# Patient Record
Sex: Male | Born: 1955 | Race: White | Hispanic: No | Smoking: Former smoker
Health system: Southern US, Community
[De-identification: ages and names within clinical notes are randomized; demographics above are authoritative.]

## PROBLEM LIST (undated history)

## (undated) DIAGNOSIS — N200 Calculus of kidney: Secondary | ICD-10-CM

## (undated) DIAGNOSIS — I10 Essential (primary) hypertension: Secondary | ICD-10-CM

## (undated) DIAGNOSIS — Z86010 Personal history of colonic polyps: Secondary | ICD-10-CM

## (undated) DIAGNOSIS — R1032 Left lower quadrant pain: Secondary | ICD-10-CM

## (undated) DIAGNOSIS — K219 Gastro-esophageal reflux disease without esophagitis: Secondary | ICD-10-CM

## (undated) DIAGNOSIS — F32A Depression, unspecified: Secondary | ICD-10-CM

## (undated) DIAGNOSIS — C4490 Unspecified malignant neoplasm of skin, unspecified: Secondary | ICD-10-CM

## (undated) DIAGNOSIS — F419 Anxiety disorder, unspecified: Secondary | ICD-10-CM

## (undated) DIAGNOSIS — J309 Allergic rhinitis, unspecified: Secondary | ICD-10-CM

## (undated) DIAGNOSIS — N400 Enlarged prostate without lower urinary tract symptoms: Secondary | ICD-10-CM

## (undated) DIAGNOSIS — R7303 Prediabetes: Secondary | ICD-10-CM

## (undated) DIAGNOSIS — M160 Bilateral primary osteoarthritis of hip: Secondary | ICD-10-CM

## (undated) DIAGNOSIS — H6990 Unspecified Eustachian tube disorder, unspecified ear: Secondary | ICD-10-CM

## (undated) DIAGNOSIS — K760 Fatty (change of) liver, not elsewhere classified: Secondary | ICD-10-CM

## (undated) DIAGNOSIS — H6980 Other specified disorders of Eustachian tube, unspecified ear: Secondary | ICD-10-CM

## (undated) DIAGNOSIS — E785 Hyperlipidemia, unspecified: Secondary | ICD-10-CM

## (undated) DIAGNOSIS — Z860101 Personal history of adenomatous and serrated colon polyps: Secondary | ICD-10-CM

## (undated) DIAGNOSIS — M25561 Pain in right knee: Secondary | ICD-10-CM

## (undated) DIAGNOSIS — E66812 Obesity, class 2: Secondary | ICD-10-CM

## (undated) DIAGNOSIS — K589 Irritable bowel syndrome without diarrhea: Secondary | ICD-10-CM

## (undated) DIAGNOSIS — Z8701 Personal history of pneumonia (recurrent): Secondary | ICD-10-CM

## (undated) DIAGNOSIS — Z87442 Personal history of urinary calculi: Secondary | ICD-10-CM

## (undated) DIAGNOSIS — G4730 Sleep apnea, unspecified: Secondary | ICD-10-CM

## (undated) DIAGNOSIS — K5732 Diverticulitis of large intestine without perforation or abscess without bleeding: Secondary | ICD-10-CM

## (undated) DIAGNOSIS — G4733 Obstructive sleep apnea (adult) (pediatric): Secondary | ICD-10-CM

## (undated) DIAGNOSIS — E669 Obesity, unspecified: Secondary | ICD-10-CM

## (undated) DIAGNOSIS — F329 Major depressive disorder, single episode, unspecified: Secondary | ICD-10-CM

## (undated) HISTORY — DX: Irritable bowel syndrome, unspecified: K58.9

## (undated) HISTORY — PX: COLON SURGERY: SHX602

## (undated) HISTORY — DX: Fatty (change of) liver, not elsewhere classified: K76.0

## (undated) HISTORY — DX: Bilateral primary osteoarthritis of hip: M16.0

## (undated) HISTORY — DX: Major depressive disorder, single episode, unspecified: F32.9

## (undated) HISTORY — DX: Obesity, class 2: E66.812

## (undated) HISTORY — DX: Obstructive sleep apnea (adult) (pediatric): G47.33

## (undated) HISTORY — DX: Essential (primary) hypertension: I10

## (undated) HISTORY — DX: Sleep apnea, unspecified: G47.30

## (undated) HISTORY — DX: Anxiety disorder, unspecified: F41.9

## (undated) HISTORY — DX: Diverticulitis of large intestine without perforation or abscess without bleeding: K57.32

## (undated) HISTORY — DX: Hyperlipidemia, unspecified: E78.5

## (undated) HISTORY — PX: ESOPHAGOGASTRODUODENOSCOPY: SHX1529

## (undated) HISTORY — DX: Calculus of kidney: N20.0

## (undated) HISTORY — DX: Obesity, unspecified: E66.9

## (undated) HISTORY — DX: Allergic rhinitis, unspecified: J30.9

## (undated) HISTORY — DX: Pain in right knee: M25.561

## (undated) HISTORY — DX: Gastro-esophageal reflux disease without esophagitis: K21.9

## (undated) HISTORY — DX: Personal history of adenomatous and serrated colon polyps: Z86.0101

## (undated) HISTORY — PX: COLONOSCOPY: SHX174

## (undated) HISTORY — DX: Left lower quadrant pain: R10.32

## (undated) HISTORY — DX: Depression, unspecified: F32.A

## (undated) HISTORY — PX: JOINT REPLACEMENT: SHX530

## (undated) HISTORY — DX: Unspecified eustachian tube disorder, unspecified ear: H69.90

## (undated) HISTORY — DX: Prediabetes: R73.03

## (undated) HISTORY — PX: HERNIA REPAIR: SHX51

## (undated) HISTORY — DX: Personal history of colonic polyps: Z86.010

## (undated) HISTORY — DX: Other specified disorders of Eustachian tube, unspecified ear: H69.80

## (undated) HISTORY — DX: Benign prostatic hyperplasia without lower urinary tract symptoms: N40.0

## (undated) HISTORY — PX: TONSILLECTOMY: SHX5217

---

## 2003-05-06 ENCOUNTER — Ambulatory Visit (HOSPITAL_BASED_OUTPATIENT_CLINIC_OR_DEPARTMENT_OTHER): Admission: RE | Admit: 2003-05-06 | Discharge: 2003-05-06 | Payer: Self-pay | Admitting: General Surgery

## 2004-09-14 ENCOUNTER — Ambulatory Visit: Payer: Self-pay | Admitting: Family Medicine

## 2004-11-23 ENCOUNTER — Ambulatory Visit: Payer: Self-pay | Admitting: Family Medicine

## 2004-11-30 ENCOUNTER — Ambulatory Visit: Payer: Self-pay | Admitting: Family Medicine

## 2005-02-23 ENCOUNTER — Ambulatory Visit: Payer: Self-pay | Admitting: Family Medicine

## 2005-03-15 ENCOUNTER — Ambulatory Visit: Payer: Self-pay | Admitting: Internal Medicine

## 2005-03-26 ENCOUNTER — Ambulatory Visit: Payer: Self-pay | Admitting: Family Medicine

## 2005-08-05 ENCOUNTER — Ambulatory Visit: Payer: Self-pay | Admitting: Family Medicine

## 2005-08-25 ENCOUNTER — Ambulatory Visit: Payer: Self-pay | Admitting: Family Medicine

## 2005-11-08 ENCOUNTER — Ambulatory Visit: Payer: Self-pay | Admitting: Family Medicine

## 2005-11-15 ENCOUNTER — Ambulatory Visit: Payer: Self-pay | Admitting: Family Medicine

## 2006-02-01 ENCOUNTER — Ambulatory Visit: Payer: Self-pay | Admitting: Family Medicine

## 2006-02-09 ENCOUNTER — Ambulatory Visit: Payer: Self-pay | Admitting: Family Medicine

## 2006-07-25 ENCOUNTER — Ambulatory Visit: Payer: Self-pay | Admitting: Family Medicine

## 2006-08-01 ENCOUNTER — Ambulatory Visit: Payer: Self-pay | Admitting: Family Medicine

## 2006-11-18 ENCOUNTER — Ambulatory Visit: Payer: Self-pay | Admitting: Family Medicine

## 2006-11-18 LAB — CONVERTED CEMR LAB
ALT: 26 U/L (ref 0–40)
AST: 20 U/L (ref 0–37)
Albumin: 3.9 g/dL (ref 3.5–5.2)
Alkaline Phosphatase: 54 U/L (ref 39–117)
BUN: 16 mg/dL (ref 6–23)
Basophils Absolute: 0 K/uL (ref 0.0–0.1)
Basophils Relative: 0.3 % (ref 0.0–1.0)
Bilirubin, Direct: 0.1 mg/dL (ref 0.0–0.3)
CO2: 31 meq/L (ref 19–32)
Calcium: 9.3 mg/dL (ref 8.4–10.5)
Chloride: 105 meq/L (ref 96–112)
Cholesterol: 237 mg/dL (ref 0–200)
Creatinine, Ser: 1 mg/dL (ref 0.4–1.5)
Direct LDL: 171 mg/dL
Eosinophils Absolute: 0.2 K/uL (ref 0.0–0.6)
Eosinophils Relative: 4.2 % (ref 0.0–5.0)
GFR calc Af Amer: 102 mL/min
GFR calc non Af Amer: 84 mL/min
Glucose, Bld: 105 mg/dL — ABNORMAL HIGH (ref 70–99)
HCT: 44.6 % (ref 39.0–52.0)
HDL: 53.8 mg/dL (ref >39.0)
Hemoglobin: 15.4 g/dL (ref 13.0–17.0)
Hgb A1c MFr Bld: 6 % (ref 4.6–6.0)
Lymphocytes Relative: 29 % (ref 12.0–46.0)
MCHC: 34.4 g/dL (ref 30.0–36.0)
MCV: 89.9 fL (ref 78.0–100.0)
Monocytes Absolute: 0.6 K/uL (ref 0.2–0.7)
Monocytes Relative: 11.7 % — ABNORMAL HIGH (ref 3.0–11.0)
Neutro Abs: 3.1 K/uL (ref 1.4–7.7)
Neutrophils Relative %: 54.8 % (ref 43.0–77.0)
PSA: 0.66 ng/mL (ref 0.10–4.00)
Platelets: 203 K/uL (ref 150–400)
Potassium: 4.6 meq/L (ref 3.5–5.1)
RBC: 4.96 M/uL (ref 4.22–5.81)
RDW: 12.5 % (ref 11.5–14.6)
Sodium: 143 meq/L (ref 135–145)
TSH: 0.97 u[IU]/mL (ref 0.35–5.50)
Total Bilirubin: 0.8 mg/dL (ref 0.3–1.2)
Total CHOL/HDL Ratio: 4.4
Total Protein: 6.7 g/dL (ref 6.0–8.3)
Triglycerides: 59 mg/dL (ref 0–149)
VLDL: 12 mg/dL (ref 0–40)
WBC: 5.5 10*3/microliter (ref 4.5–10.5)

## 2006-11-25 ENCOUNTER — Ambulatory Visit: Payer: Self-pay | Admitting: Family Medicine

## 2007-01-09 ENCOUNTER — Encounter: Admission: RE | Admit: 2007-01-09 | Discharge: 2007-01-09 | Payer: Self-pay | Admitting: Orthopaedic Surgery

## 2007-01-24 ENCOUNTER — Ambulatory Visit: Payer: Self-pay | Admitting: Family Medicine

## 2007-01-24 LAB — CONVERTED CEMR LAB
ALT: 26 units/L (ref 0–40)
AST: 23 units/L (ref 0–37)
Bilirubin, Direct: 0.1 mg/dL (ref 0.0–0.3)
Cholesterol: 178 mg/dL (ref 0–200)
HDL: 58.7 mg/dL (ref >39.0)
Total Protein: 6.5 g/dL (ref 6.0–8.3)
Triglycerides: 52 mg/dL (ref 0–149)
VLDL: 10 mg/dL (ref 0–40)

## 2007-03-09 ENCOUNTER — Encounter: Payer: Self-pay | Admitting: Family Medicine

## 2007-03-10 ENCOUNTER — Encounter: Admission: RE | Admit: 2007-03-10 | Discharge: 2007-03-10 | Payer: Self-pay | Admitting: Gastroenterology

## 2007-03-10 ENCOUNTER — Encounter: Payer: Self-pay | Admitting: Family Medicine

## 2007-04-06 ENCOUNTER — Encounter: Admission: RE | Admit: 2007-04-06 | Discharge: 2007-04-06 | Payer: Self-pay | Admitting: Gastroenterology

## 2007-09-19 DIAGNOSIS — R109 Unspecified abdominal pain: Secondary | ICD-10-CM

## 2007-10-03 ENCOUNTER — Ambulatory Visit: Payer: Self-pay | Admitting: Family Medicine

## 2007-11-20 ENCOUNTER — Ambulatory Visit: Payer: Self-pay | Admitting: Family Medicine

## 2007-11-20 LAB — CONVERTED CEMR LAB
ALT: 28 units/L (ref 0–53)
AST: 20 units/L (ref 0–37)
Alkaline Phosphatase: 51 units/L (ref 39–117)
BUN: 15 mg/dL (ref 6–23)
Basophils Relative: 0.1 % (ref 0.0–1.0)
Bilirubin, Direct: 0.1 mg/dL (ref 0.0–0.3)
CO2: 29 meq/L (ref 19–32)
Calcium: 9.3 mg/dL (ref 8.4–10.5)
Creatinine, Ser: 1.1 mg/dL (ref 0.4–1.5)
Eosinophils Relative: 3.7 % (ref 0.0–5.0)
Glucose, Bld: 105 mg/dL — ABNORMAL HIGH (ref 70–99)
HDL: 44.9 mg/dL (ref >39.0)
Hemoglobin: 14.9 g/dL (ref 13.0–17.0)
Ketones, urine, test strip: NEGATIVE
LDL Cholesterol: 96 mg/dL (ref 0–99)
Lymphocytes Relative: 32.9 % (ref 12.0–46.0)
Monocytes Relative: 10.3 % (ref 3.0–11.0)
Nitrite: NEGATIVE
Platelets: 183 10*3/uL (ref 150–400)
RDW: 12.7 % (ref 11.5–14.6)
Specific Gravity, Urine: 1.01
Total Bilirubin: 0.9 mg/dL (ref 0.3–1.2)
Total Protein: 6.2 g/dL (ref 6.0–8.3)
Urobilinogen, UA: 0.2
VLDL: 9 mg/dL (ref 0–40)
WBC Urine, dipstick: NEGATIVE
WBC: 5.8 10*3/uL (ref 4.5–10.5)

## 2007-11-27 ENCOUNTER — Ambulatory Visit: Payer: Self-pay | Admitting: Family Medicine

## 2007-11-27 DIAGNOSIS — K589 Irritable bowel syndrome without diarrhea: Secondary | ICD-10-CM

## 2007-11-27 DIAGNOSIS — E785 Hyperlipidemia, unspecified: Secondary | ICD-10-CM

## 2007-11-29 IMAGING — CR DG UGI W/ SMALL BOWEL HIGH DENSITY
19 of 24 series · 19 of 24 positions shown · non-contrast
Comparison: none

CLINICAL DATA: Minimal thickening of the mucosa of the terminal ileum on CT.  Diarrhea.
 UPPER GI SERIES WITH SMALL BOWEL FOLLOW THROUGH: 
 KUB:  A preliminary film of the abdomen shows a nonspecific bowel gas pattern.  No opaque calculi are noted.  Small amount of residual contrast is noted in the diverticulum overlying the right colon.  
 UPPER GI:  Double contrast upper GI was performed.  The mucosa of the esophagus appears normal.  There is a small sliding hiatal hernia present and moderate gastroesophageal reflux is noted.  The stomach is normal in contour and peristalsis. The duodenal bulb fills well with no ulceration and the duodenal loop is in normal position.  
 SMALL BOWEL FOLLOW THROUGH:  The patient was given additional barium orally and images of the small bowel were obtained. The mucosa of the small bowel appears normal with no edema, mass, or dilatation. The terminal ileum is well visualized and appears normal.

[run (1 of 19)]
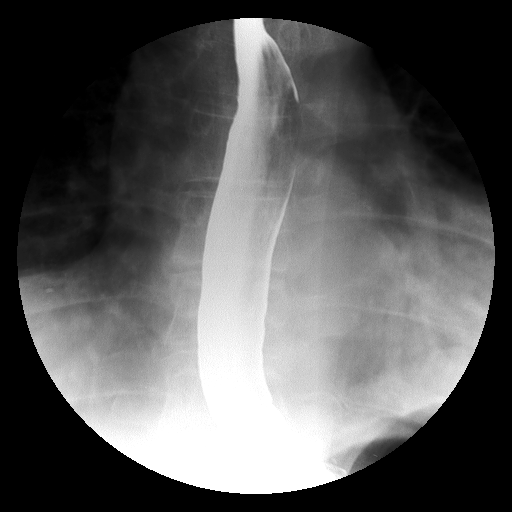

[run (2 of 19)]
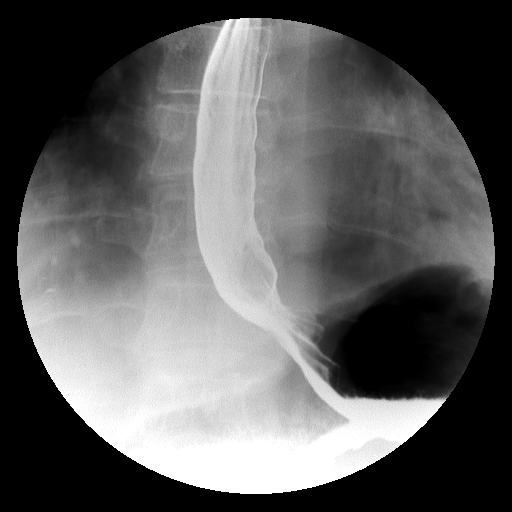

[run (3 of 19)]
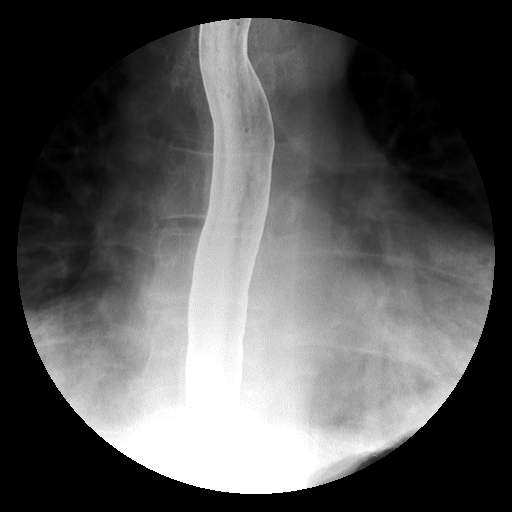

[run (4 of 19)]
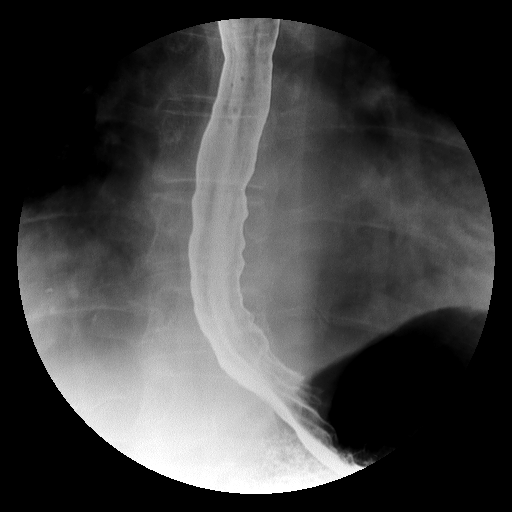

[run (5 of 19)]
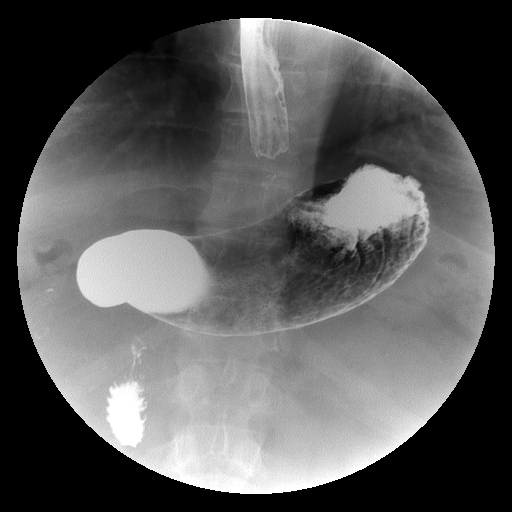

[run (6 of 19)]
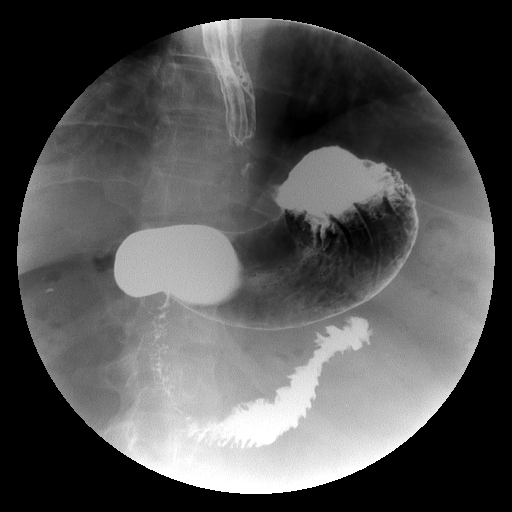

[run (7 of 19)]
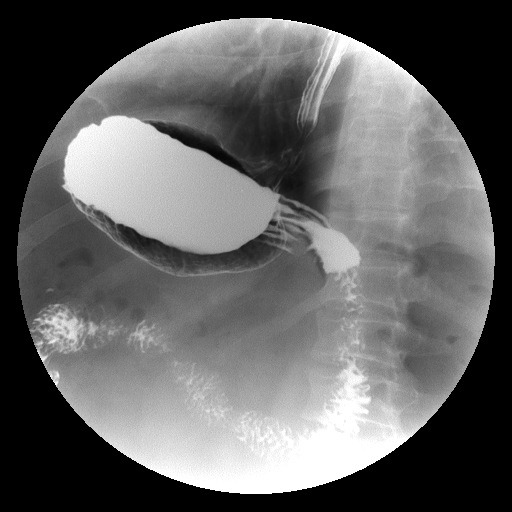

[run (8 of 19)]
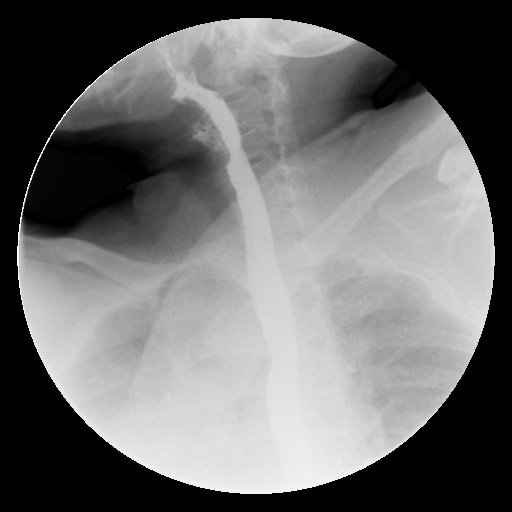

[run (9 of 19)]
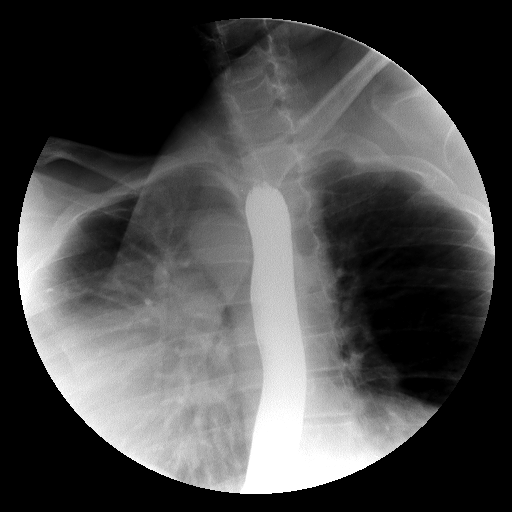

[run (10 of 19)]
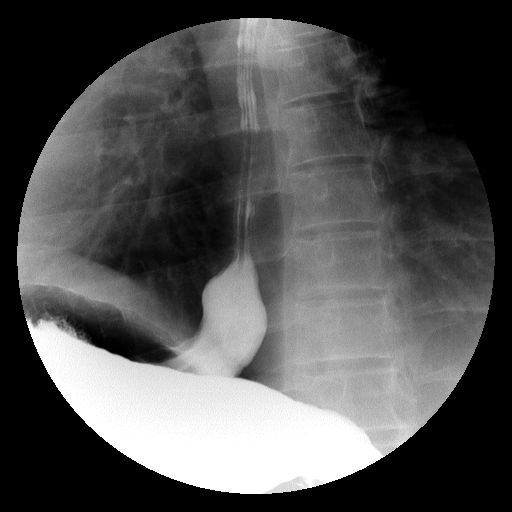

[run (11 of 19)]
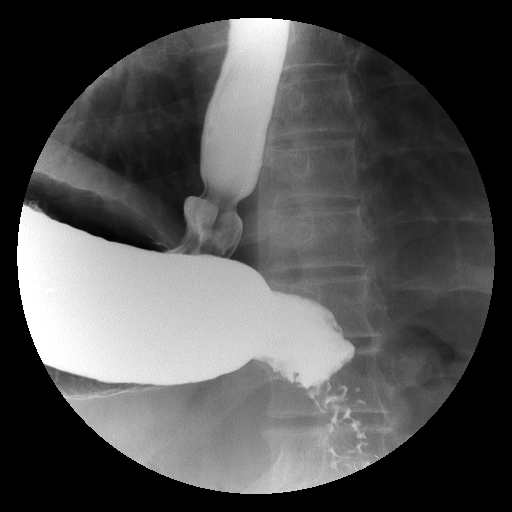

[run (12 of 19)]
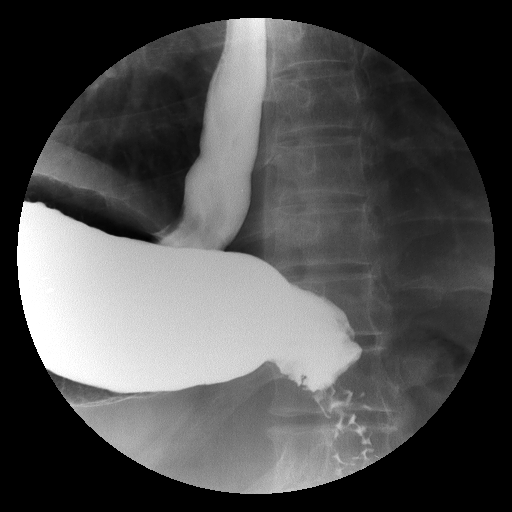

[run (13 of 19)]
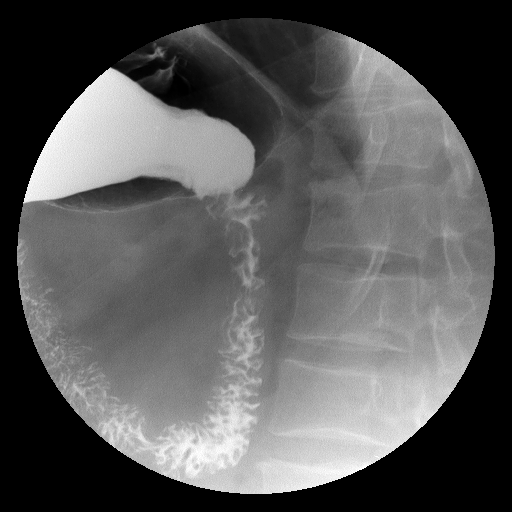

[run (14 of 19)]
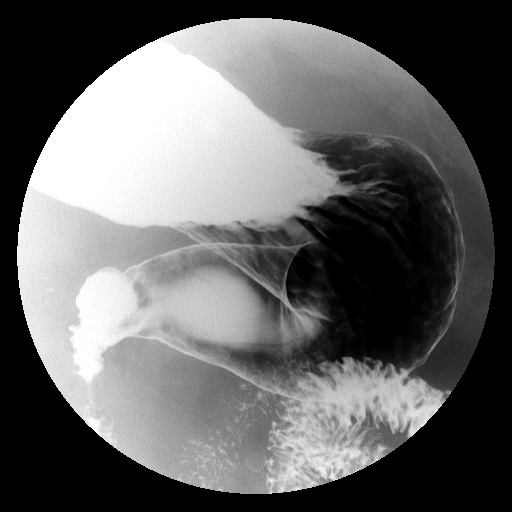

[run (15 of 19)]
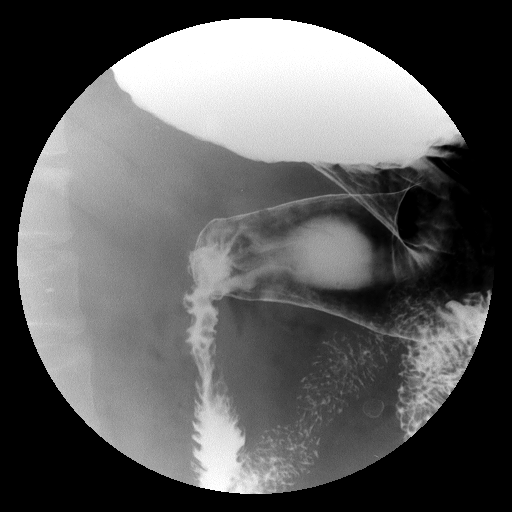

[run (16 of 19)]
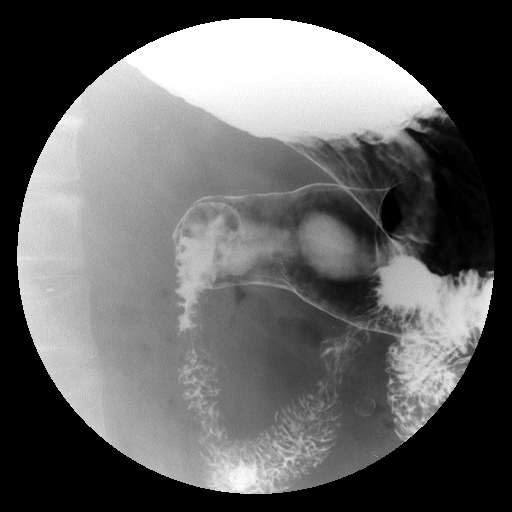

[run (17 of 19)]
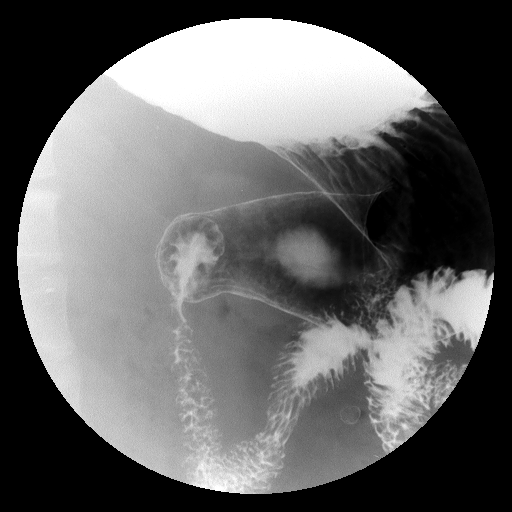

[run (18 of 19)]
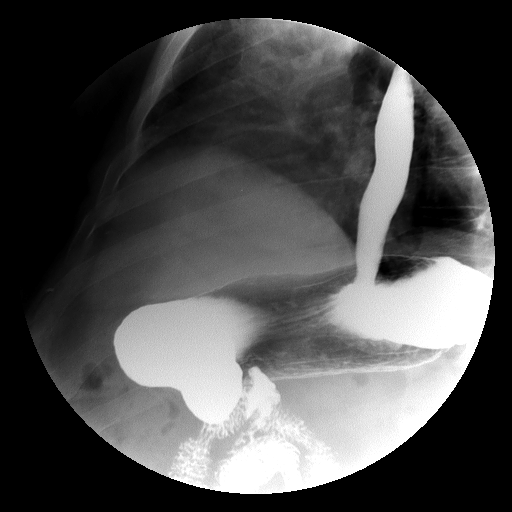

[run (19 of 19)]
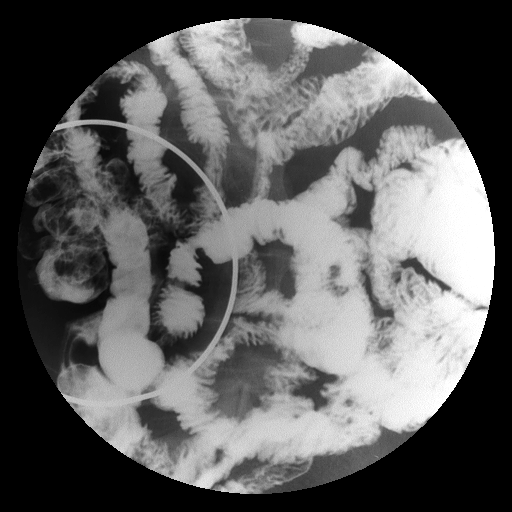

[19 of 24 positions shown; findings below may reference images not displayed]

IMPRESSION: 1.  Small sliding hiatal hernia with moderate reflux.
 2.  Negative small bowel follow through.  Terminal ileum appears normal.

## 2008-11-19 ENCOUNTER — Telehealth: Payer: Self-pay | Admitting: *Deleted

## 2008-11-19 ENCOUNTER — Ambulatory Visit: Payer: Self-pay | Admitting: Family Medicine

## 2008-11-19 LAB — CONVERTED CEMR LAB
AST: 23 units/L (ref 0–37)
Alkaline Phosphatase: 50 units/L (ref 39–117)
BUN: 16 mg/dL (ref 6–23)
Blood in Urine, dipstick: NEGATIVE
Chloride: 106 meq/L (ref 96–112)
Eosinophils Absolute: 0.1 10*3/uL (ref 0.0–0.7)
Eosinophils Relative: 1.1 % (ref 0.0–5.0)
GFR calc non Af Amer: 75 mL/min
HDL: 50 mg/dL (ref >39.0)
LDL Cholesterol: 94 mg/dL (ref 0–99)
Lymphocytes Relative: 30.7 % (ref 12.0–46.0)
MCV: 91 fL (ref 78.0–100.0)
Neutrophils Relative %: 58.6 % (ref 43.0–77.0)
Nitrite: NEGATIVE
Platelets: 167 10*3/uL (ref 150–400)
Potassium: 4.4 meq/L (ref 3.5–5.1)
Total Bilirubin: 0.8 mg/dL (ref 0.3–1.2)
Total CHOL/HDL Ratio: 3.1
Urobilinogen, UA: 0.2
VLDL: 9 mg/dL (ref 0–40)
WBC Urine, dipstick: NEGATIVE
WBC: 5 10*3/uL (ref 4.5–10.5)
pH: 6

## 2008-11-26 ENCOUNTER — Ambulatory Visit: Payer: Self-pay | Admitting: Family Medicine

## 2009-01-14 DIAGNOSIS — M5137 Other intervertebral disc degeneration, lumbosacral region: Secondary | ICD-10-CM

## 2009-01-22 ENCOUNTER — Ambulatory Visit: Payer: Self-pay | Admitting: Family Medicine

## 2009-03-20 ENCOUNTER — Telehealth: Payer: Self-pay | Admitting: *Deleted

## 2009-06-04 DIAGNOSIS — M65849 Other synovitis and tenosynovitis, unspecified hand: Secondary | ICD-10-CM

## 2009-06-04 DIAGNOSIS — M65839 Other synovitis and tenosynovitis, unspecified forearm: Secondary | ICD-10-CM

## 2009-06-09 DIAGNOSIS — L299 Pruritus, unspecified: Secondary | ICD-10-CM | POA: Insufficient documentation

## 2009-06-11 ENCOUNTER — Telehealth: Payer: Self-pay | Admitting: Family Medicine

## 2009-06-11 ENCOUNTER — Ambulatory Visit: Payer: Self-pay | Admitting: Family Medicine

## 2009-07-21 ENCOUNTER — Telehealth: Payer: Self-pay | Admitting: *Deleted

## 2009-07-25 ENCOUNTER — Encounter: Payer: Self-pay | Admitting: Family Medicine

## 2009-08-12 ENCOUNTER — Telehealth: Payer: Self-pay | Admitting: Family Medicine

## 2009-11-21 ENCOUNTER — Ambulatory Visit: Payer: Self-pay | Admitting: Family Medicine

## 2009-11-21 LAB — CONVERTED CEMR LAB
ALT: 34 units/L (ref 0–53)
BUN: 18 mg/dL (ref 6–23)
Bilirubin Urine: NEGATIVE
Bilirubin, Direct: 0.1 mg/dL (ref 0.0–0.3)
Cholesterol: 147 mg/dL (ref 0–200)
Creatinine, Ser: 1.1 mg/dL (ref 0.4–1.5)
Eosinophils Relative: 1.5 % (ref 0.0–5.0)
GFR calc non Af Amer: 74.56 mL/min (ref >60)
Ketones, urine, test strip: NEGATIVE
LDL Cholesterol: 85 mg/dL (ref 0–99)
MCV: 94.9 fL (ref 78.0–100.0)
Monocytes Absolute: 0.6 10*3/uL (ref 0.1–1.0)
Monocytes Relative: 9.8 % (ref 3.0–12.0)
Neutrophils Relative %: 58.2 % (ref 43.0–77.0)
PSA: 0.62 ng/mL (ref 0.10–4.00)
Platelets: 198 10*3/uL (ref 150.0–400.0)
Protein, U semiquant: NEGATIVE
Total Bilirubin: 0.6 mg/dL (ref 0.3–1.2)
Triglycerides: 32 mg/dL (ref 0.0–149.0)
Urobilinogen, UA: 0.2
VLDL: 6.4 mg/dL (ref 0.0–40.0)
WBC: 6.2 10*3/uL (ref 4.5–10.5)

## 2009-11-24 ENCOUNTER — Telehealth: Payer: Self-pay | Admitting: Family Medicine

## 2009-11-25 ENCOUNTER — Ambulatory Visit: Payer: Self-pay | Admitting: Family Medicine

## 2009-11-25 DIAGNOSIS — J309 Allergic rhinitis, unspecified: Secondary | ICD-10-CM | POA: Insufficient documentation

## 2009-11-25 DIAGNOSIS — M2550 Pain in unspecified joint: Secondary | ICD-10-CM | POA: Insufficient documentation

## 2009-12-01 ENCOUNTER — Ambulatory Visit: Payer: Self-pay | Admitting: Family Medicine

## 2010-03-09 ENCOUNTER — Telehealth: Payer: Self-pay | Admitting: Family Medicine

## 2010-07-16 ENCOUNTER — Ambulatory Visit: Payer: Self-pay | Admitting: Family Medicine

## 2010-07-16 DIAGNOSIS — F329 Major depressive disorder, single episode, unspecified: Secondary | ICD-10-CM

## 2010-07-23 ENCOUNTER — Ambulatory Visit: Payer: Self-pay | Admitting: Licensed Clinical Social Worker

## 2010-07-30 ENCOUNTER — Ambulatory Visit: Payer: Self-pay | Admitting: Family Medicine

## 2010-08-04 ENCOUNTER — Ambulatory Visit: Payer: Self-pay | Admitting: Licensed Clinical Social Worker

## 2010-08-18 ENCOUNTER — Ambulatory Visit: Payer: Self-pay | Admitting: Licensed Clinical Social Worker

## 2010-09-11 ENCOUNTER — Telehealth: Payer: Self-pay | Admitting: *Deleted

## 2010-10-20 ENCOUNTER — Ambulatory Visit
Admission: RE | Admit: 2010-10-20 | Discharge: 2010-10-20 | Payer: Self-pay | Source: Home / Self Care | Attending: Family Medicine | Admitting: Family Medicine

## 2010-10-21 ENCOUNTER — Telehealth: Payer: Self-pay | Admitting: Internal Medicine

## 2010-10-21 ENCOUNTER — Ambulatory Visit
Admission: RE | Admit: 2010-10-21 | Discharge: 2010-10-21 | Payer: Self-pay | Source: Home / Self Care | Attending: Family Medicine | Admitting: Family Medicine

## 2010-10-24 ENCOUNTER — Encounter: Payer: Self-pay | Admitting: General Surgery

## 2010-11-03 NOTE — Assessment & Plan Note (Signed)
Summary: 2 WK ROV // RS   Vital Signs:  Patient profile:   55 year old male Weight:      239 pounds Temp:     98.2 degrees F oral BP sitting:   110 / 80  (left arm) Cuff size:   regular  Vitals Entered By: Kern Reap CMA Duncan Dull) (July 30, 2010 3:01 PM) CC: follow-up visit   CC:  follow-up visit.  History of Present Illness: Kevin Young is a 55 year old male, who comes in today for follow-up of depression.  We started him on Celexa 20 mg nightly and Ativan .5 b.i.d. he comes back saying he feels much better.  He complains of being groggy during the day.  He says it usually occurs in the morning before he takes the Ativan.  We discussed options.  He likes to decrease the dose of Celexa 10 mg a day.  Continue the Ativan .5 b.i.d.  He had a first session with Darl Pikes and these coming back November the first for the second I will discuss with Darl Pikes his medication  Allergies: No Known Drug Allergies PMH-FH-SH reviewed for relevance  Social History: Reviewed history from 12/01/2009 and no changes required. Occupation: Married Alcohol use-no Drug use-no Regular exercise-no Former Smoker  Review of Systems      See HPI  Physical Exam  General:  Well-developed,well-nourished,in no acute distress; alert,appropriate and cooperative throughout examination Psych:  Cognition and judgment appear intact. Alert and cooperative with normal attention span and concentration. No apparent delusions, illusions, hallucinations   Impression & Recommendations:  Problem # 1:  DEPRESSIVE DISORDER (ICD-311) Assessment New  His updated medication list for this problem includes:    Celexa 20 Mg Tabs (Citalopram hydrobromide) .[name]... 1 tab @ bedtime    Ativan 0.5 Mg Tabs (Lorazepam) .[name]... Take 1 tablet by mouth two times a day  Complete Medication List: 1)  Lipitor 20 Mg Tabs (Atorvastatin calcium) .... 1/2 at bedtime 2)  Librax 2.5-5 Mg Caps (Clidinium-chlordiazepoxide) .... Take 2 tab two times  a day 3)  Flexeril 10 Mg Tabs (Cyclobenzaprine hcl) .[name].. 1 tab @ bedtime 4)  Vicodin Es 7.5-750 Mg Tabs (Hydrocodone-acetaminophen) .[name].. 1 tab @ bedtime 5)  Flonase 50 Mcg/act Susp (Fluticasone propionate) .... Uad 6)  Diclofenac Sodium 75 Mg Tbec (Diclofenac sodium) .[name].. 1 two times a day as needed 7)  Aspirin 81 Mg Tbec (Aspirin) .[name].. 1 once daily 8)  Celexa 20 Mg Tabs (Citalopram hydrobromide) .[name].. 1 tab @ bedtime 9)  Ativan 0.5 Mg Tabs (Lorazepam) .... Take 1 tablet by mouth two times a day  Patient Instructions: 1)  decrease the Celexa to 10 mg a day. 2)  Continue the Ativan .5 twice daily. 3)  I will discuss with you and Darl Pikes next Tuesday about your medication   Orders Added: 1)  Est. Patient Level III [[address]]

## 2010-11-03 NOTE — Progress Notes (Signed)
Summary: refill  Phone Note Refill Request Message from:  Fax from Pharmacy on March 09, 2010 4:47 PM  Refills Requested: Medication #1:  FLEXERIL 10 MG TABS 1 tab @ bedtime Initial call taken by: Kern Reap CMA Duncan Dull),  March 09, 2010 4:47 PM    Prescriptions: FLEXERIL 10 MG TABS (CYCLOBENZAPRINE HCL) 1 tab @ bedtime  #30 Tablet x 1   Entered by:   Kern Reap CMA (AAMA)   Authorized by:   Roderick Pee MD   Signed by:   Kern Reap CMA (AAMA) on 03/09/2010   Method used:   [facility] [phone][facility]ail[facility]       [address] [place], [place]  [address]       Ph: [phone] or [phone]       Fax: [phone]   RxID:   [id number]

## 2010-11-03 NOTE — Assessment & Plan Note (Signed)
Summary: cpx/njr----PTS WIFE RSC (BMP) // RS   Vital Signs:  Patient profile:   55 year old male Weight:      232 pounds BMI:     37.58 Temp:     98.6 degrees F oral BP sitting:   116 / 78  (left arm) Cuff size:   regular  Vitals Entered By: Raechel Ache, RN (December 01, 2009 10:42 AM) CC: CPX, labs done.    CC:  CPX and labs done. [name]  History of Present Illness: Kevin Young is a 54 year old, married male, who has quit using tobacco products x 3 weeks.  It comes in today for physical examination  He takes Lipitor 10 mg nightly for hyper lipidemia, LDL 85, total cholesterol 161.  He takes Librax two tabs b.i.d. for IVS.  He also takes diclofenac 75 mg b.i.d. for arthritis.  He takes Zyrtec and OTC 10 mg for allergic rhinitis.  Last year.  He had $3000 worth of dental work.  He decided it was time to quit the tobacco products.  He was using smokeless tobacco and cigars.  He spent tobacco free x 3 weeks.  He gets routine eye care.  Dental care, colonoscopy 2009 polyps x 2 on recall card  Preventive Screening-Counseling & Management  Alcohol-Tobacco     Smoking Status: quit  Allergies: No Known Drug Allergies  Past History:  Past medical, surgical, family and social histories (including risk factors) reviewed, and no changes noted (except as noted below).  Past Medical History: Reviewed history from 11/27/2007 and no changes required. tonsillectomy hospitalized for seizure, years ago, secondary to Paxil withdrawal hyperlipidemia irritable bowel syndrome allergic rhinitis  Past Surgical History: Reviewed history from 11/26/2008 and no changes required. Denies surgical history  Family History: Reviewed history from 11/27/2007 and no changes required. father skin cancer, hypertension, ex-smoker, and glaucoma mother, IBS one brother with a history of seizure disorder and depression two sisters, one in good health.  One has migraine headaches  Social  History: Reviewed history from 11/27/2007 and no changes required. Occupation: Married Alcohol use-no Drug use-no Regular exercise-no Former Smoker Smoking Status:  quit  Review of Systems      See HPI  Physical Exam  General:  Well-developed,well-nourished,in no acute distress; alert,appropriate and cooperative throughout examination Head:  Normocephalic and atraumatic without obvious abnormalities. No apparent alopecia or balding. Eyes:  No corneal or conjunctival inflammation noted. EOMI. Perrla. Funduscopic exam benign, without hemorrhages, exudates or papilledema. Vision grossly normal. Ears:  External ear exam shows no significant lesions or deformities.  Otoscopic examination reveals clear canals, tympanic membranes are intact bilaterally without bulging, retraction, inflammation or discharge. Hearing is grossly normal bilaterally. Nose:  External nasal examination shows no deformity or inflammation. Nasal mucosa are pink and moist without lesions or exudates. Mouth:  Oral mucosa and oropharynx without lesions or exudates.  Teeth in good repair. Neck:  No deformities, masses, or tenderness noted. Chest Wall:  No deformities, masses, tenderness or gynecomastia noted. Breasts:  No masses or gynecomastia noted Lungs:  Normal respiratory effort, chest expands symmetrically. Lungs are clear to auscultation, no crackles or wheezes. Heart:  Normal rate and regular rhythm. S1 and S2 normal without gallop, murmur, click, rub or other extra sounds. Abdomen:  Bowel sounds positive,abdomen soft and non-tender without masses, organomegaly or hernias noted. Rectal:  No external abnormalities noted. Normal sphincter tone. No rectal masses or tenderness. Genitalia:  Testes bilaterally descended without nodularity, tenderness or masses. No scrotal masses or lesions. No  penis lesions or urethral discharge. Prostate:  Prostate gland firm and smooth, no enlargement, nodularity, tenderness, mass,  asymmetry or induration. Msk:  No deformity or scoliosis noted of thoracic or lumbar spine.   Pulses:  R and L carotid,radial,femoral,dorsalis pedis and posterior tibial pulses are full and equal bilaterally Extremities:  No clubbing, cyanosis, edema, or deformity noted with normal full range of motion of all joints.   Neurologic:  No cranial nerve deficits noted. Station and gait are normal. Plantar reflexes are down-going bilaterally. DTRs are symmetrical throughout. Sensory, motor and coordinative functions appear intact. Skin:  Intact without suspicious lesions or rashes Cervical Nodes:  No lymphadenopathy noted Axillary Nodes:  No palpable lymphadenopathy Inguinal Nodes:  No significant adenopathy Psych:  Cognition and judgment appear intact. Alert and cooperative with normal attention span and concentration. No apparent delusions, illusions, hallucinations   Impression & Recommendations:  Problem # 1:  ARTHRALGIA (ICD-719.40) Assessment Unchanged  Orders: Prescription Created Electronically [phone])  Problem # 2:  HYPERLIPIDEMIA (ICD-272.4) Assessment: Improved  His updated medication list for this problem includes:    Lipitor 20 Mg Tabs (Atorvastatin calcium) .[name]... 1/2 at bedtime  Orders: Prescription Created Electronically [phone])  Problem # 3:  IBS (ICD-564.1) Assessment: Improved  Orders: Prescription Created Electronically [phone])  Problem # 4:  HEALTH SCREENING (ICD-V70.0) Assessment: Unchanged  Orders: Prescription Created Electronically [phone])  Complete Medication List: 1)  Lipitor 20 Mg Tabs (Atorvastatin calcium) .... 1/2 at bedtime 2)  Librax 2.5-5 Mg Caps (Clidinium-chlordiazepoxide) .... Take 2 tab two times a day 3)  Flexeril 10 Mg Tabs (Cyclobenzaprine hcl) .[name].. 1 tab @ bedtime 4)  Vicodin Es 7.5-750 Mg Tabs (Hydrocodone-acetaminophen) .[name].. 1 tab @ bedtime 5)  Flonase 50 Mcg/act Susp (Fluticasone propionate) .... Uad 6)  Diclofenac Sodium 75 Mg Tbec  (Diclofenac sodium) .[name].. 1 two times a day as needed 7)  Aspirin 81 Mg Tbec (Aspirin) .[name].. 1 once daily  Patient Instructions: 1)  Please schedule a follow-up appointment in 1 year. 2)  It is important that you exercise regularly at least 20 minutes 5 times a week. If you develop chest pain, have severe difficulty breathing, or feel very tired , stop exercising immediately and seek medical attention. 3)  Schedule a colonoscopy/sigmoidoscopy to help detect colon cancer. 4)  Take an Aspirin every day. Prescriptions: LIBRAX 2.5-5 MG CAPS (CLIDINIUM-CHLORDIAZEPOXIDE) take 2 tab two times a day  #400 x 3   Entered and Authorized by:   Roderick Pee MD   Signed by:   Roderick Pee MD on 12/01/2009   Method used:   [facility]        [facility]  [address] [facility] [phone][facility] ([facility]ail[facility]       [address]       [place], [place]  [address]       Ph: [phone] or [phone]       Fax: [phone]   RxID:   [id number] FLONASE 50 MCG/ACT SUSP (FLUTICASONE PROPIONATE) UAD  #2 x 4   Entered and Authorized by:   Roderick Pee MD   Signed by:   Roderick Pee MD on 12/01/2009   Method used:   [facility]        [facility]  [address] [phone][facility] (retail[facility]       [address]       [place], [place]  [address]  Ph: [phone] or [phone]       Fax: [phone]   RxID:   [id number] LIPITOR 20 MG  TABS (ATORVASTATIN CALCIUM) 1/2 at bedtime  #50 x 3   Entered and Authorized by:   Roderick Pee MD   Signed by:   Roderick Pee MD on 12/01/2009   Method used:   [facility]        [facility]  [address] [phone][facility] ([facility]ail[facility]       [address] [place], [place]  [address]       Ph: [phone] or [phone]       Fax: [phone]   RxID:   [id number]

## 2010-11-03 NOTE — Assessment & Plan Note (Signed)
Summary: stress issue/njr   Vital Signs:  Patient profile:   55 year old male Weight:      241 pounds Temp:     98.3 degrees F oral BP sitting:   110 / 72  Vitals Entered By: Lynann Beaver CMA (July 16, 2010 4:32 PM)  History of Present Illness: Kevin Young is a 55 year old male, who comes in today for evaluation of depression and anxiety.  He noted about two months ago he began having symptoms of depression and anxiety.  It's beginning to affect his work and the he's had a hostile meeting with his boss . this has resulted in a great amount of work stresshe felt so stressful.  He did not do her work October, the sixth, seventh, and 10th.  He is here  today because he realizes he needs some help.  He never had any symptoms like this in the past, except when he is conscious, and major marital divorce .  Current Medications (verified): 1)  Lipitor 20 Mg  Tabs (Atorvastatin Calcium) .... 1/2 At Bedtime 2)  Librax 2.5-5 Mg Caps (Clidinium-Chlordiazepoxide) .... Take 2 Tab Two Times A Day 3)  Flexeril 10 Mg Tabs (Cyclobenzaprine Hcl) .[name].. 1 Tab @ Bedtime 4)  Vicodin Es 7.5-750 Mg Tabs (Hydrocodone-Acetaminophen) .[name].. 1 Tab @ Bedtime 5)  Flonase 50 Mcg/act Susp (Fluticasone Propionate) .... Uad 6)  Diclofenac Sodium 75 Mg Tbec (Diclofenac Sodium) .[name].. 1 Two Times A Day As Needed 7)  Aspirin 81 Mg Tbec (Aspirin) .[name].. 1 Once Daily  Allergies (verified): No Known Drug Allergies  Past History:  Past medical, surgical, family and social histories (including risk factors) reviewed for relevance to current acute and chronic problems.  Past Medical History: Reviewed history from 11/27/2007 and no changes required. tonsillectomy hospitalized for seizure, years ago, secondary to Paxil withdrawal hyperlipidemia irritable bowel syndrome allergic rhinitis  Past Surgical History: Reviewed history from 11/26/2008 and no changes required. Denies surgical history  Family History: Reviewed  history from 11/27/2007 and no changes required. father skin cancer, hypertension, ex-smoker, and glaucoma mother, IBS one brother with a history of seizure disorder and depression two sisters, one in good health.  One has migraine headaches  Social History: Reviewed history from 12/01/2009 and no changes required. Occupation: Married Alcohol use-no Drug use-no Regular exercise-no Former Smoker  Review of Systems      See HPI  Physical Exam  General:  Well-developed,well-nourished,in no acute distress; alert,appropriate and cooperative throughout examination Psych:  Oriented X3 and tearful.     Problems:  Medical Problems Added: 1)  Dx of Depressive Disorder  (ICD-311)  Impression & Recommendations:  Problem # 1:  DEPRESSIVE DISORDER (ICD-311) Assessment New  His updated medication list for this problem includes:    Celexa 20 Mg Tabs (Citalopram hydrobromide) .[name]... 1 tab @ bedtime    Ativan 0.5 Mg Tabs (Lorazepam) .[name]... Take 1 tablet by mouth two times a day  Complete Medication List: 1)  Lipitor 20 Mg Tabs (Atorvastatin calcium) .... 1/2 at bedtime 2)  Librax 2.5-5 Mg Caps (Clidinium-chlordiazepoxide) .... Take 2 tab two times a day 3)  Flexeril 10 Mg Tabs (Cyclobenzaprine hcl) .[name].. 1 tab @ bedtime 4)  Vicodin Es 7.5-750 Mg Tabs (Hydrocodone-acetaminophen) .[name].. 1 tab @ bedtime 5)  Flonase 50 Mcg/act Susp (Fluticasone propionate) .... Uad 6)  Diclofenac Sodium 75 Mg Tbec (Diclofenac sodium) .[name].. 1 two times a day as needed 7)  Aspirin 81 Mg Tbec (Aspirin) .[name].. 1 once daily 8)  Celexa 20  Mg Tabs (Citalopram hydrobromide) .[name].. 1 tab @ bedtime 9)  Ativan 0.5 Mg Tabs (Lorazepam) .... Take 1 tablet by mouth two times a day  Patient Instructions: 1)  begin Celexa 20 mg a day at bedtime, along with .5 mg of Ativan twice daily. 2)  Get an appointment to see Judithe Modest, ASAP. 3)  Return to see me in two weeks for follow-up Prescriptions: CELEXA 20 MG TABS (CITALOPRAM  HYDROBROMIDE) 1 tab @ bedtime  #30 x 0   Entered and Authorized by:   Roderick Pee MD   Signed by:   Roderick Pee MD on 07/16/2010   Method used:   Print then Give to Patient   RxID:   [id number] ATIVAN 0.5 MG TABS (LORAZEPAM) Take 1 tablet by mouth two times a day  #60 x 0   Entered and Authorized by:   Roderick Pee MD   Signed by:   Roderick Pee MD on 07/16/2010   Method used:   Print then Give to Patient   RxID:   [id number] CELEXA 20 MG TABS (CITALOPRAM HYDROBROMIDE) 1 tab @ bedtime  #30 x 0   Entered and Authorized by:   Roderick Pee MD   Signed by:   Roderick Pee MD on 07/16/2010   Method used:   [facility]        [facility]  [address] [phone][facility] ([facility]ail)       [address] [place], [place]  [address]       Ph: [phone] or [phone]       Fax: [phone]   RxID:   [id number]

## 2010-11-03 NOTE — Assessment & Plan Note (Signed)
Summary: chills/earache/headache/pt coming in at 4pm/per Rachel/cjr   Vital Signs:  Patient profile:   55 year old male Height:      66 inches Weight:      234 pounds BMI:     37.91 Temp:     98.3 degrees F oral BP sitting:   120 / 80  (left arm) Cuff size:   regular  Vitals Entered By: Kern Reap CMA Duncan Dull) (November 25, 2009 4:54 PM)  Reason for Visit head and ear congestion  History of Present Illness: Kevin Young is a 55 year old, married man nonsmoker, who comes in with a 4 day history of head congestion, postnasal drip.  No fever or viral symptoms.  He does have a history of allergic rhinitis.  No triggers  Allergies: No Known Drug Allergies  Past History:  Past medical, surgical, family and social histories (including risk factors) reviewed for relevance to current acute and chronic problems.  Past Medical History: Reviewed history from 11/27/2007 and no changes required. tonsillectomy hospitalized for seizure, years ago, secondary to Paxil withdrawal hyperlipidemia irritable bowel syndrome allergic rhinitis  Past Surgical History: Reviewed history from 11/26/2008 and no changes required. Denies surgical history  Family History: Reviewed history from 11/27/2007 and no changes required. father skin cancer, hypertension, ex-smoker, and glaucoma mother, IBS one brother with a history of seizure disorder and depression two sisters, one in good health.  One has migraine headaches  Social History: Reviewed history from 11/27/2007 and no changes required. Occupation: Married Never Smoked Alcohol use-no Drug use-no Regular exercise-no  Review of Systems      See HPI  Physical Exam  General:  Well-developed,well-nourished,in no acute distress; alert,appropriate and cooperative throughout examination Head:  Normocephalic and atraumatic without obvious abnormalities. No apparent alopecia or balding. Eyes:  No corneal or conjunctival inflammation noted. EOMI.  Perrla. Funduscopic exam benign, without hemorrhages, exudates or papilledema. Vision grossly normal. Ears:  External ear exam shows no significant lesions or deformities.  Otoscopic examination reveals clear canals, tympanic membranes are intact bilaterally without bulging, retraction, inflammation or discharge. Hearing is grossly normal bilaterally. Nose:  septum in the midline, 4+ nasal edema Mouth:  Oral mucosa and oropharynx without lesions or exudates.  Teeth in good repair. Neck:  No deformities, masses, or tenderness noted. Chest Wall:  No deformities, masses, tenderness or gynecomastia noted. Lungs:  Normal respiratory effort, chest expands symmetrically. Lungs are clear to auscultation, no crackles or wheezes.   Impression & Recommendations:  Problem # 1:  ALLERGIC RHINITIS (ICD-477.9) Assessment New  His updated medication list for this problem includes:    Flonase 50 Mcg/act Susp (Fluticasone propionate) ..... Uad  Orders: Prescription Created Electronically [phone])  Complete Medication List: 1)  Lipitor 20 Mg Tabs (Atorvastatin calcium) .... 1/2 at bedtime 2)  Librax 2.5-5 Mg Caps (Clidinium-chlordiazepoxide) .... Take 2 tab two times a day 3)  Prednisone 20 Mg Tabs (Prednisone) .... Uad 4)  Flexeril 10 Mg Tabs (Cyclobenzaprine hcl) .[name].. 1 tab @ bedtime 5)  Vicodin Es 7.5-750 Mg Tabs (Hydrocodone-acetaminophen) .[name].. 1 tab @ bedtime 6)  Flonase 50 Mcg/act Susp (Fluticasone propionate) .... Uad  Patient Instructions: 1)  take plain Zyrtec 10 mg nightly and irrigate  y nose beginning with Afrin followed by the saline ...netti pot........ followed by the steroid nasal spray nightly.  After 5 nights stop  the afrin  but continue the saline and a steroid nasal spray Prescriptions: FLONASE 50 MCG/ACT SUSP (FLUTICASONE PROPIONATE) UAD  #2 x 4   Entered and  Authorized by:   Roderick Pee MD   Signed by:   Roderick Pee MD on 11/25/2009   Method used:   [facility]         [facility]  [address] [facility] [phone]* ([facility]ail)       [address] [place], [place]  [address]       Ph: [phone] or [phone]       Fax: [phone]   RxID:   [id number]

## 2010-11-03 NOTE — Progress Notes (Signed)
Summary[facility]  Phone Note Refill Request Call back at Home Phone [phone] Message from:  Patient on September 11, 2010 12:37 PM  Refills Requested: Medication #1:  CELEXA 20 MG TABS 1 tab @ bedtime Initial call taken by: Kern Reap CMA Duncan Dull),  September 11, 2010 12:37 PM Caller: spouse-Judy Summary of Call: [facility] and was told that script for Citalopram has not been rcvd from doctor. Pls call in today asap.    Initial call taken by: Lucy Antigua,  September 11, 2010 11:40 AM    Prescriptions: CELEXA 20 MG TABS (CITALOPRAM HYDROBROMIDE) 1 tab @ bedtime  #90 x 0   Entered by:   Kern Reap CMA (AAMA)   Authorized by:   Roderick Pee MD   Signed by:   Kern Reap CMA (AAMA) on 09/11/2010   Method used:   [facility]        [facility]  [address] [phone][facility] ([facility]       [address] [place], [place]  [address]       Ph: [phone] or [phone]       Fax: [phone]   RxID:   [id number]

## 2010-11-03 NOTE — Progress Notes (Signed)
Summary: REFILL  Phone Note Refill Request Message from:  Fax from Pharmacy  Refills Requested: Medication #1:  LIPITOR 20 MG  TABS 1/2 at bedtime [facility][phone]      FAX---[phone]  Initial call taken by: Warnell Forester,  November 24, 2009 12:38 PM    Prescriptions: LIPITOR 20 MG  TABS (ATORVASTATIN CALCIUM) 1/2 at bedtime  #30 x 6   Entered by:   Kern Reap CMA (AAMA)   Authorized by:   Roderick Pee MD   Signed by:   Kern Reap CMA (AAMA) on 11/24/2009   Method used:   [facility]        [facility]  [address] [address] [phone]* (retail)       [address] [place], [place]  [address]       Ph: [phone] or [phone]       Fax: [phone]   RxID:   [id number]

## 2010-11-03 NOTE — Letter (Signed)
Summary: Out of Work  [facility] [employer]  [address]   [place], [place] [address]   Phone: [phone]  Fax: [phone]    November 25, 2009   Employee:  TEANDRE HAMRE    To Whom It May Concern:   For Medical reasons, please excuse the above named employee from work for the following dates:  Start:   November 24, 2009  End:    If you need additional information, please feel free to contact our office.         Sincerely,    Kelle Darting, MD

## 2010-11-05 NOTE — Assessment & Plan Note (Signed)
Summary: severe sore throat/dm   Vital Signs:  Patient profile:   55 year old male Weight:      234 pounds Temp:     98.2 degrees F oral BP sitting:   118 / 76  (left arm) Cuff size:   regular  Vitals Entered By: Duard Brady LPN (October 21, 2010 4:19 PM) CC: c/o throat pain and not sleeping Is Patient Diabetic? No   History of Present Illness: Here to follow up a bad ST which has been present for 6 days. He has sinus pressure, HA, PND, and a very ST on the left side. No cough or fever. No NVD.   Allergies (verified): No Known Drug Allergies  Past History:  Past Medical History: Reviewed history from 11/27/2007 and no changes required. tonsillectomy hospitalized for seizure, years ago, secondary to Paxil withdrawal hyperlipidemia irritable bowel syndrome allergic rhinitis  Past Surgical History: Reviewed history from 11/26/2008 and no changes required. Denies surgical history  Review of Systems  The patient denies anorexia, fever, weight loss, weight gain, vision loss, decreased hearing, hoarseness, chest pain, syncope, dyspnea on exertion, peripheral edema, prolonged cough, hemoptysis, abdominal pain, melena, hematochezia, severe indigestion/heartburn, hematuria, incontinence, genital sores, muscle weakness, suspicious skin lesions, transient blindness, difficulty walking, depression, unusual weight change, abnormal bleeding, enlarged lymph nodes, angioedema, breast masses, and testicular masses.    Physical Exam  General:  Well-developed,well-nourished,in no acute distress; alert,appropriate and cooperative throughout examination Head:  Normocephalic and atraumatic without obvious abnormalities. No apparent alopecia or balding. Eyes:  No corneal or conjunctival inflammation noted. EOMI. Perrla. Funduscopic exam benign, without hemorrhages, exudates or papilledema. Vision grossly normal. Ears:  External ear exam shows no significant lesions or deformities.   Otoscopic examination reveals clear canals, tympanic membranes are intact bilaterally without bulging, retraction, inflammation or discharge. Hearing is grossly normal bilaterally. Nose:  External nasal examination shows no deformity or inflammation. Nasal mucosa are pink and moist without lesions or exudates. Mouth:  Oral mucosa and oropharynx without lesions or exudates.  Teeth in good repair. Neck:  shotty tender AC nodes on the left  side  Lungs:  Normal respiratory effort, chest expands symmetrically. Lungs are clear to auscultation, no crackles or wheezes.   Impression & Recommendations:  Problem # 1:  ACUTE SINUSITIS, UNSPECIFIED (ICD-461.9)  His updated medication list for this problem includes:    Flonase 50 Mcg/act Susp (Fluticasone propionate) ..... Uad    Hydromet 5-1.5 Mg/59ml Syrp (Hydrocodone-homatropine) .[name]... 1/2 to 1 tsp three times a day as needed    Zithromax Z-pak 250 Mg Tabs (Azithromycin) .[name]... As directed  Complete Medication List: 1)  Lipitor 20 Mg Tabs (Atorvastatin calcium) .... 1/2 at bedtime 2)  Librax 2.5-5 Mg Caps (Clidinium-chlordiazepoxide) .... Take 2 tab two times a day 3)  Flexeril 10 Mg Tabs (Cyclobenzaprine hcl) .[name].. 1 tab @ bedtime 4)  Vicodin Es 7.5-750 Mg Tabs (Hydrocodone-acetaminophen) .[name].. 1 tab @ bedtime 5)  Flonase 50 Mcg/act Susp (Fluticasone propionate) .... Uad 6)  Diclofenac Sodium 75 Mg Tbec (Diclofenac sodium) .[name].. 1 two times a day as needed 7)  Aspirin 81 Mg Tbec (Aspirin) .[name].. 1 once daily 8)  Celexa 20 Mg Tabs (Citalopram hydrobromide) .[name].. 1 tab @ bedtime 9)  Ativan 0.5 Mg Tabs (Lorazepam) .... Take 1 tablet by mouth two times a day 10)  Hydromet 5-1.5 Mg/63ml Syrp (Hydrocodone-homatropine) .... 1/2 to 1 tsp three times a day as needed 11)  Zithromax Z-pak 250 Mg Tabs (Azithromycin) .... As directed 12)  Prednisone (pak) 10 Mg Tabs (Prednisone) .... As directed for 6 days  Patient Instructions: 1)  Please schedule a follow-up  appointment as needed . Out of work from 10-20-10 until 10-26-10. Prescriptions: PREDNISONE (PAK) 10 MG TABS (PREDNISONE) as directed for 6 days  #1 x 0   Entered and Authorized by:   Nelwyn Salisbury MD   Signed by:   Nelwyn Salisbury MD on 10/21/2010   Method used:   [facility]        [phone]       [address]       [place], [place]  [address]       Ph: [phone] or [phone]       Fax: [phone]   RxID:   [id number] Christena Deem Z-PAK 250 MG TABS (AZITHROMYCIN) as directed  #1 x 0   Entered and Authorized by:   Nelwyn Salisbury MD   Signed by:   Nelwyn Salisbury MD on 10/21/2010   Method used:   [facility]        [facility]  [address] [phone][facility] (retail[facility]       [address] [place], [place]  [address]       Ph: [phone] or [phone]       Fax: [phone]   RxID:   [id number]    Orders Added: 1)  Est. Patient Level IV [60737]

## 2010-11-05 NOTE — Assessment & Plan Note (Signed)
Summary: sore throat/dm   Vital Signs:  Patient profile:   55 year old male Height:      66 inches Weight:      238 pounds BMI:     38.55 Temp:     97.9 degrees F oral BP sitting:   130 / 90  (left arm) Cuff size:   regular  Vitals Entered By: Kern Reap CMA Duncan Dull) (October 20, 2010 12:12 PM) CC: sore throat, refills   CC:  sore throat and refills.  History of Present Illness: Kevin Young is a 55 year old, married male, nonsmoker, who comes in with a 3-day history of sore throat.  Review of systems negative  Allergies: No Known Drug Allergies  Past History:  Past medical, surgical, family and social histories (including risk factors) reviewed for relevance to current acute and chronic problems.  Past Medical History: Reviewed history from 11/27/2007 and no changes required. tonsillectomy hospitalized for seizure, years ago, secondary to Paxil withdrawal hyperlipidemia irritable bowel syndrome allergic rhinitis  Past Surgical History: Reviewed history from 11/26/2008 and no changes required. Denies surgical history  Family History: Reviewed history from 11/27/2007 and no changes required. father skin cancer, hypertension, ex-smoker, and glaucoma mother, IBS one brother with a history of seizure disorder and depression two sisters, one in good health.  One has migraine headaches  Social History: Reviewed history from 12/01/2009 and no changes required. Occupation: Married Alcohol use-no Drug use-no Regular exercise-no Former Smoker  Review of Systems      See HPI  Physical Exam  General:  Well-developed,well-nourished,in no acute distress; alert,appropriate and cooperative throughout examination Head:  Normocephalic and atraumatic without obvious abnormalities. No apparent alopecia or balding. Eyes:  No corneal or conjunctival inflammation noted. EOMI. Perrla. Funduscopic exam benign, without hemorrhages, exudates or papilledema. Vision grossly  normal. Ears:  External ear exam shows no significant lesions or deformities.  Otoscopic examination reveals clear canals, tympanic membranes are intact bilaterally without bulging, retraction, inflammation or discharge. Hearing is grossly normal bilaterally. Nose:  External nasal examination shows no deformity or inflammation. Nasal mucosa are pink and moist without lesions or exudates. Mouth:  Oral mucosa and oropharynx without lesions or exudates.  Teeth in good repair. Neck:  No deformities, masses, or tenderness noted. Chest Wall:  No deformities, masses, tenderness or gynecomastia noted. Lungs:  Normal respiratory effort, chest expands symmetrically. Lungs are clear to auscultation, no crackles or wheezes.   Problems:  Medical Problems Added: 1)  Dx of Viral Infection-unspec  (ICD-079.99)  Impression & Recommendations:  Problem # 1:  VIRAL INFECTION-UNSPEC (ICD-079.99) Assessment New  His updated medication list for this problem includes:    Diclofenac Sodium 75 Mg Tbec (Diclofenac sodium) .[name]... 1 two times a day as needed    Aspirin 81 Mg Tbec (Aspirin) .[name]... 1 once daily    Hydromet 5-1.5 Mg/12ml Syrp (Hydrocodone-homatropine) .[name]... 1/2 to 1 tsp three times a day as needed  Complete Medication List: 1)  Lipitor 20 Mg Tabs (Atorvastatin calcium) .... 1/2 at bedtime 2)  Librax 2.5-5 Mg Caps (Clidinium-chlordiazepoxide) .... Take 2 tab two times a day 3)  Flexeril 10 Mg Tabs (Cyclobenzaprine hcl) .[name].. 1 tab @ bedtime 4)  Vicodin Es 7.5-750 Mg Tabs (Hydrocodone-acetaminophen) .[name].. 1 tab @ bedtime 5)  Flonase 50 Mcg/act Susp (Fluticasone propionate) .... Uad 6)  Diclofenac Sodium 75 Mg Tbec (Diclofenac sodium) .[name].. 1 two times a day as needed 7)  Aspirin 81 Mg Tbec (Aspirin) .[name].. 1 once daily 8)  Celexa 20 Mg Tabs (  Citalopram hydrobromide) .[name].. 1 tab @ bedtime 9)  Ativan 0.5 Mg Tabs (Lorazepam) .... Take 1 tablet by mouth two times a day 10)  Hydromet 5-1.5 Mg/80ml Syrp  (Hydrocodone-homatropine) .... 1/2 to 1 tsp three times a day as needed  Patient Instructions: 1)  Get plenty of rest, drink lots of clear liquids, and use Tylenol or Ibuprofen for fever and comfort. Return in 7-10 days if you're not better:sooner if you're feeling worse. 2)  Take 650-1000mg  of Tylenol every 4-6 hours as needed for relief of pain or comfort of fever AVOID taking more than 4000mg   in a 24 hour period (can cause liver damage in higher doses). 3)  Hydromet one half to 1 teaspoon up to 3 times a day as needed for sore throat and cough Prescriptions: HYDROMET 5-1.5 MG/5ML SYRP (HYDROCODONE-HOMATROPINE) 1/2 to 1 tsp three times a day as needed  #8oz x 1   Entered and Authorized by:   Roderick Pee MD   Signed by:   Roderick Pee MD on 10/20/2010   Method used:   Print then Give to Patient   RxID:   [id number]    Orders Added: 1)  Est. Patient Level III [14782]

## 2010-11-05 NOTE — Progress Notes (Signed)
  Phone Note Call from Patient   Caller: Patient Call For: Roderick Pee MD Summary of Call: Pt cannot swallow or eat due to extreme sore throat ........Marland Kitchensent to Urgent Care. Initial call taken by: [facility] CMA AAMA,  October 21, 2010 1:51 PM

## 2010-11-26 ENCOUNTER — Other Ambulatory Visit: Payer: Self-pay | Admitting: Family Medicine

## 2010-11-26 DIAGNOSIS — Z0000 Encounter for general adult medical examination without abnormal findings: Secondary | ICD-10-CM

## 2010-11-26 LAB — CBC WITH DIFFERENTIAL/PLATELET
Basophils Absolute: 0 10*3/uL (ref 0.0–0.1)
Basophils Relative: 0.2 % (ref 0.0–3.0)
Eosinophils Absolute: 0.1 10*3/uL (ref 0.0–0.7)
HCT: 43.7 % (ref 39.0–52.0)
Hemoglobin: 15 g/dL (ref 13.0–17.0)
Lymphs Abs: 1.8 10*3/uL (ref 0.7–4.0)
MCHC: 34.3 g/dL (ref 30.0–36.0)
MCV: 92.2 fl (ref 78.0–100.0)
Monocytes Absolute: 0.6 10*3/uL (ref 0.1–1.0)
Neutro Abs: 3.1 10*3/uL (ref 1.4–7.7)
RBC: 4.74 Mil/uL (ref 4.22–5.81)
RDW: 14.4 % (ref 11.5–14.6)

## 2010-11-26 LAB — BASIC METABOLIC PANEL
CO2: 29 mEq/L (ref 19–32)
Calcium: 9.2 mg/dL (ref 8.4–10.5)
Chloride: 107 mEq/L (ref 96–112)
Glucose, Bld: 98 mg/dL (ref 70–99)
Potassium: 4.3 mEq/L (ref 3.5–5.1)
Sodium: 142 mEq/L (ref 135–145)

## 2010-11-26 LAB — POCT URINALYSIS DIPSTICK
Bilirubin, UA: NEGATIVE
Ketones, UA: NEGATIVE
Nitrite, UA: NEGATIVE
Protein, UA: NEGATIVE
Spec Grav, UA: 1.025
pH, UA: 5.5

## 2010-11-26 LAB — LIPID PANEL
Cholesterol: 147 mg/dL (ref 0–200)
HDL: 52.5 mg/dL (ref >39.00)
VLDL: 8.4 mg/dL (ref 0.0–40.0)

## 2010-11-26 LAB — HEPATIC FUNCTION PANEL
Albumin: 4.1 g/dL (ref 3.5–5.2)
Alkaline Phosphatase: 49 U/L (ref 39–117)
Total Protein: 6.7 g/dL (ref 6.0–8.3)

## 2010-11-26 LAB — PSA: PSA: 0.55 ng/mL (ref 0.10–4.00)

## 2010-12-02 ENCOUNTER — Encounter: Payer: Self-pay | Admitting: Family Medicine

## 2010-12-03 ENCOUNTER — Ambulatory Visit (INDEPENDENT_AMBULATORY_CARE_PROVIDER_SITE_OTHER): Payer: BC Managed Care – PPO | Admitting: Family Medicine

## 2010-12-03 ENCOUNTER — Encounter: Payer: Self-pay | Admitting: Family Medicine

## 2010-12-03 DIAGNOSIS — K589 Irritable bowel syndrome without diarrhea: Secondary | ICD-10-CM

## 2010-12-03 DIAGNOSIS — F329 Major depressive disorder, single episode, unspecified: Secondary | ICD-10-CM

## 2010-12-03 DIAGNOSIS — E785 Hyperlipidemia, unspecified: Secondary | ICD-10-CM

## 2010-12-03 MED ORDER — LORAZEPAM 0.5 MG PO TABS: 0.5000 mg | ORAL_TABLET | Freq: Two times a day (BID) | ORAL | Status: DC

## 2010-12-03 MED ORDER — ATORVASTATIN CALCIUM 20 MG PO TABS: 10.0000 mg | ORAL_TABLET | Freq: Every day | ORAL | Status: DC

## 2010-12-03 MED ORDER — CILIDINIUM-CHLORDIAZEPOXIDE 2.5-5 MG PO CAPS: 2.0000 | ORAL_CAPSULE | Freq: Two times a day (BID) | ORAL | Status: DC

## 2010-12-03 MED ORDER — CITALOPRAM HYDROBROMIDE 20 MG PO TABS: 20.0000 mg | ORAL_TABLET | Freq: Every day | ORAL | Status: DC

## 2010-12-03 MED ORDER — HYDROCORTISONE ACETATE 25 MG RE SUPP: 25.0000 mg | Freq: Two times a day (BID) | RECTAL | Status: AC

## 2010-12-03 NOTE — Patient Instructions (Signed)
Continue your current medications. Use the  medicated suppository nightly at bedtime for 12 nights.  Take a stool softener daily.  If the rectal bleeding persists, then let us know.  We will refer you to GI for a consult.  Return in one year for follow-up, sooner if any problems

## 2010-12-03 NOTE — Progress Notes (Signed)
  Subjective:    Patient ID: Kevin Young, male    DOB: Jul 04, 1956, 55 y.o.   MRN: [id number]  Korver is a 55 year old, married man nonsmoker, who comes in today for a physical examination because of a history of IBS hyperlipidemia, mild depression.  His IBS has flared, up.  He's been constipated and now he has a hemorrhoid.  It's been painful and bleeding.  He takes Lipitor 10 mg nightly for hyperlipidemia, and 81 mg, baby aspirin.  Lipids are ago with an LDL of 86.  He takes Celexa 20 mg nightly and Ativan .5 b.i.d. For a combination of anxiety and depression.  Is functioning well.    Review of Systems  Constitutional: Negative.   HENT: Negative.   Eyes: Negative.   Respiratory: Negative.   Cardiovascular: Negative.   Gastrointestinal: Negative.   Genitourinary: Negative.   Musculoskeletal: Negative.   Skin: Negative.   Neurological: Negative.   Hematological: Negative.   Psychiatric/Behavioral: Negative.        Objective:   Physical Exam  Constitutional: He is oriented to person, place, and time. He appears well-developed and well-nourished.  HENT:  Head: Normocephalic and atraumatic.  Right Ear: External ear normal.  Left Ear: External ear normal.  Nose: Nose normal.  Mouth/Throat: Oropharynx is clear and moist.  Eyes: Conjunctivae and EOM are normal. Pupils are equal, round, and reactive to light.  Neck: Normal range of motion. Neck supple. No JVD present. No tracheal deviation present. No thyromegaly present.  Cardiovascular: Normal rate, regular rhythm, normal heart sounds and intact distal pulses.  Exam reveals no gallop and no friction rub.   No murmur heard. Pulmonary/Chest: Effort normal and breath sounds normal. No stridor. No respiratory distress. He has no wheezes. He has no rales. He exhibits no tenderness.  Abdominal: Soft. Bowel sounds are normal. He exhibits no distension and no mass. There is no tenderness. There is no rebound and no guarding.   Genitourinary: Rectum normal, prostate normal and penis normal. Guaiac negative stool. No penile tenderness.       External hemorrhoid healing  Musculoskeletal: Normal range of motion. He exhibits no edema and no tenderness.  Lymphadenopathy:    He has no cervical adenopathy.  Neurological: He is alert and oriented to person, place, and time. He has normal reflexes. No cranial nerve deficit. He exhibits normal muscle tone.  Skin: Skin is warm and dry. No rash noted. No erythema. No pallor.  Psychiatric: He has a normal mood and affect. His behavior is normal. Judgment and thought content normal.          Assessment & Plan:  Hyperlipidemia continue Lipitor 10 nightly, and aspirin tablet.  IVS external hemorrhoid and secondary to constipation.  Continue Librax two tabs q.i.d. P.r.n. And stool softener.  Mild depression.  Continue Celexa 20 nightly along with lorazepam .5 b.i.d. Follow-up in one year

## 2010-12-24 ENCOUNTER — Other Ambulatory Visit: Payer: Self-pay | Admitting: Family Medicine

## 2010-12-28 ENCOUNTER — Telehealth: Payer: Self-pay | Admitting: Family Medicine

## 2010-12-28 DIAGNOSIS — K589 Irritable bowel syndrome without diarrhea: Secondary | ICD-10-CM

## 2010-12-28 MED ORDER — CILIDINIUM-CHLORDIAZEPOXIDE 2.5-5 MG PO CAPS: 2.0000 | ORAL_CAPSULE | Freq: Two times a day (BID) | ORAL | Status: DC

## 2010-12-28 NOTE — Telephone Encounter (Signed)
Has a ? About the quantity for his stomach medication. Please return call. (wife does not know what the name is of the rx).      [facility]

## 2011-01-21 ENCOUNTER — Other Ambulatory Visit: Payer: Self-pay | Admitting: Family Medicine

## 2011-01-22 ENCOUNTER — Other Ambulatory Visit: Payer: Self-pay | Admitting: *Deleted

## 2011-01-22 MED ORDER — HYDROCODONE-HOMATROPINE 5-1.5 MG/5ML PO SYRP: 8.0000 mL | ORAL_SOLUTION | Freq: Four times a day (QID) | ORAL | Status: AC | PRN

## 2011-01-22 NOTE — Telephone Encounter (Signed)
Calling for a Rx for cough.  No fever or congestion.

## 2011-01-25 NOTE — Telephone Encounter (Signed)
Fleet Contras please call,,,,,,,,,, we do not call in and TBI is over the phone,,,,,,,,,,,,,, if he feels he needs an antibiotic, and need to be seen

## 2011-01-25 NOTE — Telephone Encounter (Signed)
Patient needs office visit if he feels like he needs an antibiotic

## 2011-02-19 NOTE — Op Note (Signed)
NAME:  Kevin Young, BIGNELL                  ACCOUNT NO.:  [id number]   MEDICAL RECORD NO.:  [id number]                   PATIENT TYPE:  AMB   LOCATION:  [facility]                                  FACILITY:  [facility]   PHYSICIAN:  Gabrielle Dare. [name], M.D.             DATE OF BIRTH:  [birth year]   DATE OF PROCEDURE:  05/06/2003  DATE OF DISCHARGE:                                 OPERATIVE REPORT   PREOPERATIVE DIAGNOSIS:  Right inguinal hernia.   POSTOPERATIVE DIAGNOSIS:  Direct right inguinal hernia.   OPERATION PERFORMED:  Repair of right inguinal hernia with mesh.   SURGEON:  Gabrielle Dare. [name], M.D.   ANESTHESIA:  General.   INDICATIONS FOR PROCEDURE:  The patient is a 55 year old white male who I  evaluated in the office for increasing pain in his right groin exacerbated  by heavy lifting at work.  He was noted to have a spontaneously reducing  right inguinal hernia and he was brought for elective repair.   DESCRIPTION OF PROCEDURE:  Informed consent was obtained.  The patient  received intravenous antibiotics.  He was brought to the operating room.  General anesthesia was administered.  His right groin and lower abdomen were  prepped and draped in sterile fashion.  A right groin incision was made.  Subcutaneous tissue were dissected down revealing the external oblique  fascia.  This was sharply divided down through the external ring.  The  leaflets of the external oblique were dissected from the underlying  structures bluntly revealing the shelving edge of the inguinal ligament  inferiorly and the aponeurosis superiorly.  The cord structures were then  encircled.  During this dissection, it was noted that he had an  approximately 2 cm direct inguinal hernia.  This was easy to reduce.  It  appeared to just contain some fat but the sac was not opened.  In order to  make our repair easier, this was dunked down back into the peritoneal cavity  and a single interrupted 3-0 Vicryl  suture was placed across the defect to  hold the hernia in the reduced position.  Once that was accomplished, the  cord was dissected to ensure that there was no indirect sac.  There was a  moderate sized cord lipoma which was dissected free from the cord structures  and removed and there was no indirect hernia sac noted on complete  dissection.  Once this was accomplished, the hernia was repaired with a key  hole mesh which I fashioned from a template and this was sewn to the pubic  tubercle firmly.  The tissues overlying the pubic tubercle medially with 0  Prolene suture and the lower leaflet of the mesh was secured with a running  0 Prolene suture along the shelving edge of the inguinal ligament.  The  superior portion was tacked down first to the tissues around the pubic  tubercle and then along the aponeurosis with a series of interrupted  0  Prolene sutures.  The internal ring was reconstructed by bringing the two  leaflets together behind the entry of the cord and this was securely sutured  down to the underlying fascia with 0 Prolene.  The aperture in the key hole  mesh was checked and just admitted the tip of a pinky finger.  The cord  structures were nice and viable.  Meticulous hemostasis was ensured.  The  area was copiously irrigated and the count was noted to be correct and we  closed in the following fashion.  First external oblique fascia was  reapproximated with running 3-0 Vicryl suture.  Prior to that the  musculature, fascia and skin had been infiltrated with 0.25% Marcaine with  epinephrine for pain relief.  Subsequently the subcutaneous tissues were  copiously irrigated and the subcutaneous tissues were approximated with a  series of interrupted 3-0 Vicryl sutures.  The area was again irrigated and  the skin was closed with running 4-  0 Monocryl subcuticular stitch.  Sponge, needle and instrument counts were  correct.  Benzoin, Steri-Strips and sterile dressings were  applied.  The  patient tolerated the procedure well without complications.  His right  testicle was returned to normal position in his scrotum and he was taken to  the recovery room in stable condition.                                                Gabrielle Dare [name], M.D.    BET/MEDQ  D:  05/06/2003  T:  05/06/2003  Job:  [id number]

## 2011-06-03 ENCOUNTER — Other Ambulatory Visit: Payer: Self-pay | Admitting: *Deleted

## 2011-06-03 DIAGNOSIS — F329 Major depressive disorder, single episode, unspecified: Secondary | ICD-10-CM

## 2011-06-03 MED ORDER — LORAZEPAM 0.5 MG PO TABS: 0.5000 mg | ORAL_TABLET | Freq: Two times a day (BID) | ORAL | Status: DC

## 2011-09-05 ENCOUNTER — Other Ambulatory Visit: Payer: Self-pay | Admitting: Family Medicine

## 2011-09-08 ENCOUNTER — Other Ambulatory Visit: Payer: Self-pay | Admitting: Family Medicine

## 2011-09-08 NOTE — Telephone Encounter (Signed)
Pt is needing a refill of Prednisone 20 mg for allergies and head congestion. Pls call in to [facility]

## 2011-09-09 NOTE — Telephone Encounter (Signed)
Prednisone 20 mg, dispense 30 tabs directions two tabs x 3 days, one tab x 3 days, a half a tab x 3 days, then half a tablet Monday, Wednesday, Friday, for a two week taper, one refill

## 2011-09-10 MED ORDER — PREDNISONE 20 MG PO TABS: 20.0000 mg | ORAL_TABLET | Freq: Every day | ORAL | Status: DC

## 2011-09-22 ENCOUNTER — Other Ambulatory Visit: Payer: Self-pay | Admitting: Family Medicine

## 2011-12-02 ENCOUNTER — Other Ambulatory Visit (INDEPENDENT_AMBULATORY_CARE_PROVIDER_SITE_OTHER): Payer: BC Managed Care – PPO

## 2011-12-02 DIAGNOSIS — Z0000 Encounter for general adult medical examination without abnormal findings: Secondary | ICD-10-CM

## 2011-12-02 LAB — BASIC METABOLIC PANEL
BUN: 18 mg/dL (ref 6–23)
CO2: 29 mEq/L (ref 19–32)
Calcium: 9.1 mg/dL (ref 8.4–10.5)
Chloride: 104 mEq/L (ref 96–112)
Creatinine, Ser: 1.2 mg/dL (ref 0.4–1.5)
Glucose, Bld: 87 mg/dL (ref 70–99)

## 2011-12-02 LAB — CBC WITH DIFFERENTIAL/PLATELET
Basophils Absolute: 0 10*3/uL (ref 0.0–0.1)
Basophils Relative: 0.4 % (ref 0.0–3.0)
Eosinophils Absolute: 0.9 10*3/uL — ABNORMAL HIGH (ref 0.0–0.7)
Lymphocytes Relative: 34.9 % (ref 12.0–46.0)
MCHC: 33.7 g/dL (ref 30.0–36.0)
MCV: 92 fl (ref 78.0–100.0)
Monocytes Absolute: 0.8 10*3/uL (ref 0.1–1.0)
Neutrophils Relative %: 42.5 % — ABNORMAL LOW (ref 43.0–77.0)
RBC: 4.75 Mil/uL (ref 4.22–5.81)
RDW: 13.8 % (ref 11.5–14.6)

## 2011-12-02 LAB — POCT URINALYSIS DIPSTICK
Bilirubin, UA: NEGATIVE
Blood, UA: NEGATIVE
Glucose, UA: NEGATIVE
Ketones, UA: NEGATIVE
Spec Grav, UA: 1.02

## 2011-12-02 LAB — HEPATIC FUNCTION PANEL
ALT: 28 U/L (ref 0–53)
AST: 25 U/L (ref 0–37)
Bilirubin, Direct: 0.1 mg/dL (ref 0.0–0.3)
Total Bilirubin: 0.4 mg/dL (ref 0.3–1.2)

## 2011-12-02 LAB — LIPID PANEL
HDL: 42.4 mg/dL (ref >39.00)
LDL Cholesterol: 84 mg/dL (ref 0–99)
Total CHOL/HDL Ratio: 3
Triglycerides: 74 mg/dL (ref 0.0–149.0)

## 2011-12-03 ENCOUNTER — Encounter: Payer: Self-pay | Admitting: Family Medicine

## 2011-12-09 ENCOUNTER — Ambulatory Visit (INDEPENDENT_AMBULATORY_CARE_PROVIDER_SITE_OTHER): Payer: BC Managed Care – PPO | Admitting: Family Medicine

## 2011-12-09 ENCOUNTER — Encounter: Payer: Self-pay | Admitting: Family Medicine

## 2011-12-09 VITALS — BP 120/80 | Temp 98.2°F | Ht 66.0 in | Wt 234.0 lb

## 2011-12-09 DIAGNOSIS — F329 Major depressive disorder, single episode, unspecified: Secondary | ICD-10-CM

## 2011-12-09 DIAGNOSIS — K589 Irritable bowel syndrome without diarrhea: Secondary | ICD-10-CM

## 2011-12-09 DIAGNOSIS — J309 Allergic rhinitis, unspecified: Secondary | ICD-10-CM

## 2011-12-09 DIAGNOSIS — E785 Hyperlipidemia, unspecified: Secondary | ICD-10-CM

## 2011-12-09 MED ORDER — CITALOPRAM HYDROBROMIDE 20 MG PO TABS: 20.0000 mg | ORAL_TABLET | Freq: Every day | ORAL | Status: DC

## 2011-12-09 MED ORDER — CILIDINIUM-CHLORDIAZEPOXIDE 2.5-5 MG PO CAPS: 2.0000 | ORAL_CAPSULE | Freq: Two times a day (BID) | ORAL | Status: DC

## 2011-12-09 MED ORDER — ATORVASTATIN CALCIUM 20 MG PO TABS: 20.0000 mg | ORAL_TABLET | Freq: Every day | ORAL | Status: DC

## 2011-12-09 MED ORDER — LORAZEPAM 0.5 MG PO TABS: 0.5000 mg | ORAL_TABLET | Freq: Two times a day (BID) | ORAL | Status: DC

## 2011-12-09 NOTE — Patient Instructions (Signed)
Continue your current medications  Begin a diet and exercise program  Followup in 1 year sooner if any problems

## 2011-12-09 NOTE — Progress Notes (Signed)
  Subjective:    Patient ID: Kevin Young, male    DOB: 03-08-1956, 56 y.o.   MRN: [id number]  HPI Kevin Young is a 56 year old married male nonsmoker who comes in today for a physical examination because of a history of hyperlipidemia, mild depression, irritable bowel syndrome  His meds are reviewed in detail and no changes.  He gets routine eye care, dental care, colonoscopy normal in GI, tetanus 2003, seasonal flu shot 2012.  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   HENT: Negative.   Eyes: Negative.   Respiratory: Negative.   Cardiovascular: Negative.   Gastrointestinal: Negative.   Genitourinary: Negative.   Musculoskeletal: Negative.   Skin: Negative.   Neurological: Negative.   Hematological: Negative.   Psychiatric/Behavioral: Negative.        Objective:   Physical Exam  Constitutional: He is oriented to person, place, and time. He appears well-developed and well-nourished.  HENT:  Head: Normocephalic and atraumatic.  Right Ear: External ear normal.  Left Ear: External ear normal.  Nose: Nose normal.  Mouth/Throat: Oropharynx is clear and moist.  Eyes: Conjunctivae and EOM are normal. Pupils are equal, round, and reactive to light.  Neck: Normal range of motion. Neck supple. No JVD present. No tracheal deviation present. No thyromegaly present.  Cardiovascular: Normal rate, regular rhythm, normal heart sounds and intact distal pulses.  Exam reveals no gallop and no friction rub.   No murmur heard. Pulmonary/Chest: Effort normal and breath sounds normal. No stridor. No respiratory distress. He has no wheezes. He has no rales. He exhibits no tenderness.  Abdominal: Soft. Bowel sounds are normal. He exhibits no distension and no mass. There is no tenderness. There is no rebound and no guarding.  Genitourinary: Rectum  normal, prostate normal and penis normal. Guaiac negative stool. No penile tenderness.  Musculoskeletal: Normal range of motion. He exhibits no edema and no tenderness.  Lymphadenopathy:    He has no cervical adenopathy.  Neurological: He is alert and oriented to person, place, and time. He has normal reflexes. No cranial nerve deficit. He exhibits normal muscle tone.  Skin: Skin is warm and dry. No rash noted. No erythema. No pallor.  Psychiatric: He has a normal mood and affect. His behavior is normal. Judgment and thought content normal.          Assessment & Plan:  Healthy male  Hyperlipidemia continue Lipitor 20 mg each bedtime  History of mild depression continue Celexa 20 mg each bedtime  -year-old bowel syndrome continue the combination of Librax and Ativan  Return one year sooner if any problems  Weight 234 pounds height 66 inches recommend diet exercise and weight loss program

## 2011-12-21 ENCOUNTER — Other Ambulatory Visit: Payer: Self-pay | Admitting: Family Medicine

## 2012-01-13 ENCOUNTER — Other Ambulatory Visit: Payer: Self-pay | Admitting: *Deleted

## 2012-01-13 DIAGNOSIS — F329 Major depressive disorder, single episode, unspecified: Secondary | ICD-10-CM

## 2012-01-13 MED ORDER — CITALOPRAM HYDROBROMIDE 20 MG PO TABS: 20.0000 mg | ORAL_TABLET | Freq: Every day | ORAL | Status: DC

## 2012-01-17 ENCOUNTER — Other Ambulatory Visit: Payer: Self-pay | Admitting: *Deleted

## 2012-01-17 DIAGNOSIS — F329 Major depressive disorder, single episode, unspecified: Secondary | ICD-10-CM

## 2012-01-17 DIAGNOSIS — K589 Irritable bowel syndrome without diarrhea: Secondary | ICD-10-CM

## 2012-01-17 MED ORDER — CILIDINIUM-CHLORDIAZEPOXIDE 2.5-5 MG PO CAPS: 2.0000 | ORAL_CAPSULE | Freq: Two times a day (BID) | ORAL | Status: DC

## 2012-01-17 MED ORDER — CITALOPRAM HYDROBROMIDE 20 MG PO TABS: 20.0000 mg | ORAL_TABLET | Freq: Every day | ORAL | Status: DC

## 2012-01-21 ENCOUNTER — Other Ambulatory Visit: Payer: Self-pay | Admitting: *Deleted

## 2012-01-21 DIAGNOSIS — F329 Major depressive disorder, single episode, unspecified: Secondary | ICD-10-CM

## 2012-01-21 DIAGNOSIS — K589 Irritable bowel syndrome without diarrhea: Secondary | ICD-10-CM

## 2012-01-21 DIAGNOSIS — E785 Hyperlipidemia, unspecified: Secondary | ICD-10-CM

## 2012-01-21 MED ORDER — ATORVASTATIN CALCIUM 20 MG PO TABS: 20.0000 mg | ORAL_TABLET | Freq: Every day | ORAL | Status: DC

## 2012-01-21 MED ORDER — CITALOPRAM HYDROBROMIDE 20 MG PO TABS: 20.0000 mg | ORAL_TABLET | Freq: Every day | ORAL | Status: DC

## 2012-01-21 MED ORDER — CILIDINIUM-CHLORDIAZEPOXIDE 2.5-5 MG PO CAPS: 2.0000 | ORAL_CAPSULE | Freq: Two times a day (BID) | ORAL | Status: DC

## 2012-02-17 ENCOUNTER — Other Ambulatory Visit: Payer: Self-pay | Admitting: *Deleted

## 2012-02-17 DIAGNOSIS — K589 Irritable bowel syndrome without diarrhea: Secondary | ICD-10-CM

## 2012-02-17 MED ORDER — LORAZEPAM 0.5 MG PO TABS: 0.5000 mg | ORAL_TABLET | Freq: Two times a day (BID) | ORAL | Status: DC

## 2012-02-17 NOTE — Telephone Encounter (Signed)
[facility] never received refill for Ativan.  Okay to fill.  Rx faxed.

## 2012-07-12 ENCOUNTER — Other Ambulatory Visit: Payer: Self-pay | Admitting: Gastroenterology

## 2012-07-12 LAB — HM COLONOSCOPY

## 2012-08-29 ENCOUNTER — Ambulatory Visit (INDEPENDENT_AMBULATORY_CARE_PROVIDER_SITE_OTHER): Payer: BC Managed Care – PPO | Admitting: Family Medicine

## 2012-08-29 ENCOUNTER — Encounter: Payer: Self-pay | Admitting: Family Medicine

## 2012-08-29 VITALS — BP 130/90 | Temp 98.1°F | Wt 245.0 lb

## 2012-08-29 DIAGNOSIS — J309 Allergic rhinitis, unspecified: Secondary | ICD-10-CM

## 2012-08-29 DIAGNOSIS — B349 Viral infection, unspecified: Secondary | ICD-10-CM

## 2012-08-29 DIAGNOSIS — B9789 Other viral agents as the cause of diseases classified elsewhere: Secondary | ICD-10-CM

## 2012-08-29 MED ORDER — FLUTICASONE PROPIONATE 50 MCG/ACT NA SUSP: NASAL | Status: DC

## 2012-08-29 MED ORDER — HYDROCODONE-HOMATROPINE 5-1.5 MG/5ML PO SYRP: ORAL_SOLUTION | ORAL | Status: DC

## 2012-08-29 NOTE — Progress Notes (Signed)
  Subjective:    Patient ID: Kevin Young, male    DOB: 01/28/1956, 56 y.o.   MRN: [id number]  HPI Kevin Young is a 56 year old male married nonsmoker who comes in with a two-week history of head congestion sore throat and postnasal drip  He has no cough no fever no sputum production.  He does have a history of allergic rhinitis. He is a nonsmoker   Review of Systems General and pulmonary review of systems otherwise negative    Objective:   Physical Exam Well-developed well-nourished male in no acute distress HEENT negative neck was supple no adenopathy lungs are clear       Assessment & Plan:  Viral syndrome plan treat symptomatically

## 2012-08-29 NOTE — Patient Instructions (Signed)
Drink lots of water  Zyrtec plain 1 at bedtime  1 shot of the steroid nasal spray up each nostril at bedtime  Hydromet 1 1/2 teaspoons each bedtime when necessary

## 2012-10-05 ENCOUNTER — Telehealth: Payer: Self-pay | Admitting: Family Medicine

## 2012-10-05 DIAGNOSIS — K589 Irritable bowel syndrome without diarrhea: Secondary | ICD-10-CM

## 2012-10-05 MED ORDER — LORAZEPAM 0.5 MG PO TABS: 0.5000 mg | ORAL_TABLET | Freq: Two times a day (BID) | ORAL | Status: DC

## 2012-10-05 NOTE — Telephone Encounter (Signed)
Rx called in for 6 months.  Left message on machine for patient.

## 2012-10-05 NOTE — Telephone Encounter (Signed)
Patient thought his rx for LORAZEPAM 0.5mg  was for 1 yr, but it was for 6mos. Now he is out. Instead of using mail order, he'd like [facility] to [facility] locally [facility] Please advise.

## 2012-12-04 ENCOUNTER — Other Ambulatory Visit (INDEPENDENT_AMBULATORY_CARE_PROVIDER_SITE_OTHER): Payer: BC Managed Care – PPO

## 2012-12-04 LAB — LIPID PANEL
Cholesterol: 154 mg/dL (ref 0–200)
HDL: 46.9 mg/dL (ref >39.00)
LDL Cholesterol: 95 mg/dL (ref 0–99)
Total CHOL/HDL Ratio: 3
Triglycerides: 59 mg/dL (ref 0.0–149.0)

## 2012-12-04 LAB — CBC WITH DIFFERENTIAL/PLATELET
Basophils Absolute: 0 10*3/uL (ref 0.0–0.1)
Eosinophils Absolute: 0.2 10*3/uL (ref 0.0–0.7)
Hemoglobin: 15.1 g/dL (ref 13.0–17.0)
Lymphocytes Relative: 29.8 % (ref 12.0–46.0)
Monocytes Relative: 10.8 % (ref 3.0–12.0)
Neutrophils Relative %: 55.9 % (ref 43.0–77.0)
Platelets: 189 10*3/uL (ref 150.0–400.0)
RDW: 13.3 % (ref 11.5–14.6)

## 2012-12-04 LAB — POCT URINALYSIS DIPSTICK
Bilirubin, UA: NEGATIVE
Blood, UA: NEGATIVE
Glucose, UA: NEGATIVE
Spec Grav, UA: 1.015

## 2012-12-04 LAB — HEPATIC FUNCTION PANEL
AST: 24 U/L (ref 0–37)
Alkaline Phosphatase: 63 U/L (ref 39–117)
Bilirubin, Direct: 0.1 mg/dL (ref 0.0–0.3)
Total Bilirubin: 0.6 mg/dL (ref 0.3–1.2)

## 2012-12-04 LAB — BASIC METABOLIC PANEL
BUN: 17 mg/dL (ref 6–23)
Calcium: 9.2 mg/dL (ref 8.4–10.5)
Creatinine, Ser: 1.1 mg/dL (ref 0.4–1.5)
GFR: 74.49 mL/min (ref >60.00)
Glucose, Bld: 108 mg/dL — ABNORMAL HIGH (ref 70–99)
Sodium: 139 mEq/L (ref 135–145)

## 2012-12-11 ENCOUNTER — Encounter: Payer: BC Managed Care – PPO | Admitting: Family Medicine

## 2012-12-25 ENCOUNTER — Other Ambulatory Visit: Payer: Self-pay | Admitting: *Deleted

## 2012-12-25 DIAGNOSIS — E785 Hyperlipidemia, unspecified: Secondary | ICD-10-CM

## 2012-12-25 MED ORDER — ATORVASTATIN CALCIUM 20 MG PO TABS: 20.0000 mg | ORAL_TABLET | Freq: Every day | ORAL | Status: DC

## 2012-12-25 MED ORDER — CILIDINIUM-CHLORDIAZEPOXIDE 2.5-5 MG PO CAPS: ORAL_CAPSULE | ORAL | Status: DC

## 2012-12-25 NOTE — Telephone Encounter (Signed)
Patient is no longer on wife's insurance.  Will need refills sent to local pharmacy.

## 2013-01-22 ENCOUNTER — Ambulatory Visit (INDEPENDENT_AMBULATORY_CARE_PROVIDER_SITE_OTHER): Payer: BC Managed Care – PPO | Admitting: Family Medicine

## 2013-01-22 ENCOUNTER — Encounter: Payer: Self-pay | Admitting: Family Medicine

## 2013-01-22 VITALS — BP 134/92 | HR 72 | Temp 98.5°F | Ht 65.5 in | Wt 241.0 lb

## 2013-01-22 DIAGNOSIS — M51379 Other intervertebral disc degeneration, lumbosacral region without mention of lumbar back pain or lower extremity pain: Secondary | ICD-10-CM

## 2013-01-22 DIAGNOSIS — F329 Major depressive disorder, single episode, unspecified: Secondary | ICD-10-CM

## 2013-01-22 DIAGNOSIS — B349 Viral infection, unspecified: Secondary | ICD-10-CM

## 2013-01-22 DIAGNOSIS — E663 Overweight: Secondary | ICD-10-CM

## 2013-01-22 DIAGNOSIS — K589 Irritable bowel syndrome without diarrhea: Secondary | ICD-10-CM

## 2013-01-22 DIAGNOSIS — B9789 Other viral agents as the cause of diseases classified elsewhere: Secondary | ICD-10-CM

## 2013-01-22 DIAGNOSIS — E785 Hyperlipidemia, unspecified: Secondary | ICD-10-CM

## 2013-01-22 DIAGNOSIS — J309 Allergic rhinitis, unspecified: Secondary | ICD-10-CM

## 2013-01-22 DIAGNOSIS — F3289 Other specified depressive episodes: Secondary | ICD-10-CM

## 2013-01-22 DIAGNOSIS — M5137 Other intervertebral disc degeneration, lumbosacral region: Secondary | ICD-10-CM

## 2013-01-22 DIAGNOSIS — Z23 Encounter for immunization: Secondary | ICD-10-CM

## 2013-01-22 MED ORDER — ATORVASTATIN CALCIUM 20 MG PO TABS: 20.0000 mg | ORAL_TABLET | Freq: Every day | ORAL | Status: DC

## 2013-01-22 MED ORDER — CILIDINIUM-CHLORDIAZEPOXIDE 2.5-5 MG PO CAPS: ORAL_CAPSULE | ORAL | Status: DC

## 2013-01-22 MED ORDER — CITALOPRAM HYDROBROMIDE 20 MG PO TABS: 20.0000 mg | ORAL_TABLET | Freq: Every day | ORAL | Status: DC

## 2013-01-22 MED ORDER — LORAZEPAM 0.5 MG PO TABS: 0.5000 mg | ORAL_TABLET | Freq: Two times a day (BID) | ORAL | Status: DC

## 2013-01-22 MED ORDER — FLUTICASONE PROPIONATE 50 MCG/ACT NA SUSP
NASAL | Status: DC
Start: 2013-01-22 — End: 2014-04-16

## 2013-01-22 NOTE — Patient Instructions (Signed)
We will set up a nutrition consult for both you and your wife  Continue your other medications  Return in one year sooner if any problems

## 2013-01-22 NOTE — Progress Notes (Signed)
  Subjective:    Patient ID: Kevin Young, male    DOB: Apr 01, 1956, 57 y.o.   MRN: [id number]  HPI Kevin Young is a 57 year old married male nonsmoker who comes in today for general physical examination because of a history of hyperlipidemia, mild depression, allergic rhinitis, and his biggest problem is his weight. He 56-1/2 inches tall and weighs 241 pounds. He states he was recently turned down for life insurance,,,,,,,,,,,, he thinks it was his weight   He gets routine eye care, dental care, recent colonoscopy normal except for one small polyp, tetanus booster given today   Review of Systems  Constitutional: Negative.   HENT: Negative.   Eyes: Negative.   Respiratory: Negative.   Cardiovascular: Negative.   Gastrointestinal: Negative.   Genitourinary: Negative.   Musculoskeletal: Negative.   Skin: Negative.   Neurological: Negative.   Psychiatric/Behavioral: Negative.        Objective:   Physical Exam  Constitutional: He is oriented to person, place, and time. He appears well-developed and well-nourished.  HENT:  Head: Normocephalic and atraumatic.  Right Ear: External ear normal.  Left Ear: External ear normal.  Nose: Nose normal.  Mouth/Throat: Oropharynx is clear and moist.  Eyes: Conjunctivae and EOM are normal. Pupils are equal, round, and reactive to light.  Neck: Normal range of motion. Neck supple. No JVD present. No tracheal deviation present. No thyromegaly present.  Cardiovascular: Normal rate, regular rhythm, normal heart sounds and intact distal pulses.  Exam reveals no gallop and no friction rub.   No murmur heard. Pulmonary/Chest: Effort normal and breath sounds normal. No stridor. No respiratory distress. He has no wheezes. He has no rales. He exhibits no tenderness.  Abdominal: Soft. Bowel sounds are normal. He exhibits no distension and no mass. There is no tenderness. There is no rebound and no guarding.  Genitourinary: Rectum normal, prostate normal  and penis normal. Guaiac negative stool. No penile tenderness.  Musculoskeletal: Normal range of motion. He exhibits no edema and no tenderness.  Lymphadenopathy:    He has no cervical adenopathy.  Neurological: He is alert and oriented to person, place, and time. He has normal reflexes. No cranial nerve deficit. He exhibits normal muscle tone.  Skin: Skin is warm and dry. No rash noted. No erythema. No pallor.  Psychiatric: He has a normal mood and affect. His behavior is normal. Judgment and thought content normal.          Assessment & Plan:  Healthy male  Overweight recommended diet exercise and weight loss,,,,,,,,,, begin with a nutrition consult for both he and his wife  Hyperlipidemia continue Lipitor and aspirin  History of mild depression continue Celexa and Ativan  IBS continue Librax twice a day  Allergic rhinitis over-the-counter antihistamine and steroid nasal spray

## 2013-02-19 ENCOUNTER — Encounter: Payer: Self-pay | Admitting: *Deleted

## 2013-02-19 ENCOUNTER — Encounter: Payer: BC Managed Care – PPO | Attending: Family Medicine | Admitting: *Deleted

## 2013-02-19 VITALS — Ht 65.5 in | Wt 245.5 lb

## 2013-02-19 DIAGNOSIS — E669 Obesity, unspecified: Secondary | ICD-10-CM | POA: Insufficient documentation

## 2013-02-19 DIAGNOSIS — Z713 Dietary counseling and surveillance: Secondary | ICD-10-CM | POA: Insufficient documentation

## 2013-02-19 DIAGNOSIS — E663 Overweight: Secondary | ICD-10-CM

## 2013-02-19 NOTE — Progress Notes (Signed)
  Medical Nutrition Therapy:  Appt start time: 0800 end time:  0900.  Assessment:  Primary concerns today: patient here for obesity. Lives with wife, Darel Hong who shops for food and they both prepare meals, They state that they eat out often. 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 He is active with his type of work. They both watch a lot of tv, he does some mowing.   MEDICATIONS: see list   DIETARY INTAKE:  Usual eating pattern includes 3 meals and 1-2 snacks per day.  Everyday foods include fair variety of all food groups.  Avoided foods include: none stated.    24-hr recall:  B ( AM): 1 large pancake with butter and syrup and jelly, fried egg and sausage at sit down restaurant. Coffee with cream and sugar poured from container Snk ( AM): not usually, or maybe a snack bar and a diet or regular soda or gator-ade  L ( PM): brings from home made by wife; sandwich OR tuna and crackers, fruit or fruit cup, occasionally chips, sweet tea Snk ( PM): not usually D (6 PM): eating out about 3-5 times a week including take-out. Includes meat, starch including sandwiches and occasionally vegetables, sweet tea Snk ( PM): occasionally a cookie or ice cream bar Beverages: coffee, sweet tea, soda both regular and diet, not much water  Usual physical activity: fairly active with work, not much at home. Played sports and rode bike growing up, enjoyed running and racquet ball in [employer]. Has some knee and back pain.  Estimated energy needs: 1800 calories 200 g carbohydrates 135 g protein 50 g fat  Progress Towards Goal(s):  In progress.   Nutritional Diagnosis:  NI-1.5 Excessive energy intake As related to activity level.  As evidenced by BMI of 40.3.    Intervention:  Nutrition counseling for weight loss initiated. Discussed Carb Counting and reading food labels as method of portion control and he demonstrated good understanding. Also discussed benefits of increased  activity and options for him to consider.   Plan:  Aim for 4 Carb Choices per meal (60 grams) +/- 1 either way  Aim for 0-2 Carbs per snack if hungry  Consider reading food labels for Total Carbohydrate of foods Consider  increasing your activity level by walking in AM for 30 minutes daily as tolerated   Handouts given during visit include: Carb Counting and Food Label handouts Meal Plan Card  Monitoring/Evaluation:  Dietary intake, exercise, reading food labels, and body weight in 2 month(s).

## 2013-02-19 NOTE — Patient Instructions (Addendum)
Plan:  Aim for 4 Carb Choices per meal (60 grams) +/- 1 either way  Aim for 0-2 Carbs per snack if hungry  Consider reading food labels for Total Carbohydrate of foods Consider  increasing your activity level by walking in AM for 30 minutes daily as tolerated

## 2013-04-23 ENCOUNTER — Ambulatory Visit: Payer: BC Managed Care – PPO | Admitting: *Deleted

## 2013-09-30 ENCOUNTER — Other Ambulatory Visit: Payer: Self-pay | Admitting: Family Medicine

## 2014-01-16 ENCOUNTER — Other Ambulatory Visit (INDEPENDENT_AMBULATORY_CARE_PROVIDER_SITE_OTHER): Payer: No Typology Code available for payment source

## 2014-01-16 DIAGNOSIS — Z0000 Encounter for general adult medical examination without abnormal findings: Secondary | ICD-10-CM

## 2014-01-16 LAB — CBC WITH DIFFERENTIAL/PLATELET
BASOS ABS: 0 10*3/uL (ref 0.0–0.1)
Basophils Relative: 0.3 % (ref 0.0–3.0)
EOS PCT: 3.8 % (ref 0.0–5.0)
Eosinophils Absolute: 0.2 10*3/uL (ref 0.0–0.7)
HCT: 44.7 % (ref 39.0–52.0)
Hemoglobin: 15.1 g/dL (ref 13.0–17.0)
LYMPHS PCT: 32.2 % (ref 12.0–46.0)
Lymphs Abs: 1.5 10*3/uL (ref 0.7–4.0)
MCHC: 33.7 g/dL (ref 30.0–36.0)
MCV: 91.4 fl (ref 78.0–100.0)
MONOS PCT: 10 % (ref 3.0–12.0)
Monocytes Absolute: 0.5 10*3/uL (ref 0.1–1.0)
Neutro Abs: 2.4 10*3/uL (ref 1.4–7.7)
Neutrophils Relative %: 53.7 % (ref 43.0–77.0)
PLATELETS: 181 10*3/uL (ref 150.0–400.0)
RBC: 4.89 Mil/uL (ref 4.22–5.81)
RDW: 13.8 % (ref 11.5–14.6)
WBC: 4.5 10*3/uL (ref 4.5–10.5)

## 2014-01-16 LAB — HEPATIC FUNCTION PANEL
ALBUMIN: 3.8 g/dL (ref 3.5–5.2)
ALT: 32 U/L (ref 0–53)
AST: 25 U/L (ref 0–37)
Alkaline Phosphatase: 52 U/L (ref 39–117)
Bilirubin, Direct: 0.1 mg/dL (ref 0.0–0.3)
Total Bilirubin: 0.9 mg/dL (ref 0.3–1.2)
Total Protein: 6.3 g/dL (ref 6.0–8.3)

## 2014-01-16 LAB — POCT URINALYSIS DIPSTICK
Bilirubin, UA: NEGATIVE
Blood, UA: NEGATIVE
Glucose, UA: NEGATIVE
Ketones, UA: NEGATIVE
LEUKOCYTES UA: NEGATIVE
Nitrite, UA: NEGATIVE
PROTEIN UA: NEGATIVE
Spec Grav, UA: 1.015
UROBILINOGEN UA: 0.2
pH, UA: 6

## 2014-01-16 LAB — LIPID PANEL
Cholesterol: 170 mg/dL (ref 0–200)
HDL: 56.1 mg/dL (ref >39.00)
LDL CALC: 103 mg/dL — AB (ref 0–99)
Total CHOL/HDL Ratio: 3
Triglycerides: 56 mg/dL (ref 0.0–149.0)
VLDL: 11.2 mg/dL (ref 0.0–40.0)

## 2014-01-16 LAB — BASIC METABOLIC PANEL
BUN: 21 mg/dL (ref 6–23)
CALCIUM: 9.2 mg/dL (ref 8.4–10.5)
CO2: 30 mEq/L (ref 19–32)
Chloride: 103 mEq/L (ref 96–112)
Creatinine, Ser: 1.1 mg/dL (ref 0.4–1.5)
GFR: 77.46 mL/min (ref >60.00)
GLUCOSE: 97 mg/dL (ref 70–99)
Potassium: 4.7 mEq/L (ref 3.5–5.1)
SODIUM: 138 meq/L (ref 135–145)

## 2014-01-16 LAB — TSH: TSH: 1.21 u[IU]/mL (ref 0.35–5.50)

## 2014-01-16 LAB — PSA: PSA: 0.58 ng/mL (ref 0.10–4.00)

## 2014-01-21 ENCOUNTER — Other Ambulatory Visit: Payer: Self-pay | Admitting: Family Medicine

## 2014-01-23 ENCOUNTER — Ambulatory Visit (INDEPENDENT_AMBULATORY_CARE_PROVIDER_SITE_OTHER): Payer: No Typology Code available for payment source | Admitting: Family Medicine

## 2014-01-23 ENCOUNTER — Encounter: Payer: Self-pay | Admitting: Family Medicine

## 2014-01-23 VITALS — BP 120/80 | Temp 98.4°F | Ht 65.4 in | Wt 228.0 lb

## 2014-01-23 DIAGNOSIS — E785 Hyperlipidemia, unspecified: Secondary | ICD-10-CM

## 2014-01-23 DIAGNOSIS — F3289 Other specified depressive episodes: Secondary | ICD-10-CM

## 2014-01-23 DIAGNOSIS — K589 Irritable bowel syndrome without diarrhea: Secondary | ICD-10-CM

## 2014-01-23 DIAGNOSIS — F329 Major depressive disorder, single episode, unspecified: Secondary | ICD-10-CM

## 2014-01-23 DIAGNOSIS — E663 Overweight: Secondary | ICD-10-CM

## 2014-01-23 MED ORDER — LORAZEPAM 0.5 MG PO TABS: ORAL_TABLET | ORAL | Status: DC

## 2014-01-23 MED ORDER — CILIDINIUM-CHLORDIAZEPOXIDE 2.5-5 MG PO CAPS: ORAL_CAPSULE | ORAL | Status: DC

## 2014-01-23 MED ORDER — ATORVASTATIN CALCIUM 20 MG PO TABS
20.0000 mg | ORAL_TABLET | Freq: Every day | ORAL | Status: DC
Start: 2014-01-23 — End: 2014-03-19

## 2014-01-23 MED ORDER — CITALOPRAM HYDROBROMIDE 20 MG PO TABS: ORAL_TABLET | ORAL | Status: DC

## 2014-01-23 NOTE — Progress Notes (Signed)
Pre visit review using our clinic review tool, if applicable. No additional management support is needed unless otherwise documented below in the visit note. 

## 2014-01-23 NOTE — Progress Notes (Signed)
   Subjective:    Patient ID: Kevin Young, male    DOB: [birth year], 58 y.o.   MRN: [id number]  HPI Kevin Young is a 58 year old male,,,,,,,,, his S.o is Kevin Young who recently had a heart transplant 3 months ago at [facility],,, who comes in today for general physical examination  He takes Lipitor 20 mg daily and aspirin for hyperlipidemia  He also takes Celexa 20 mg at bedtime Ativan 0.5 twice a day frank siding depression.  He has a history of IBS takes Librax 2 tabs twice a day from GI  He also uses steroid nasal spray  He gets routine eye care, dental care, recent colonoscopy showed one polyp. Vaccinations updated by Apolonio Schneiders   Review of Systems  Constitutional: Negative.   HENT: Negative.   Eyes: Negative.   Respiratory: Negative.   Cardiovascular: Negative.   Gastrointestinal: Negative.   Genitourinary: Negative.   Musculoskeletal: Negative.   Skin: Negative.   Neurological: Negative.   Psychiatric/Behavioral: Negative.        Objective:   Physical Exam  Nursing note and vitals reviewed. Constitutional: He is oriented to person, place, and time. He appears well-developed and well-nourished.  He's lost 15 pounds in the past 3 months  HENT:  Head: Normocephalic and atraumatic.  Right Ear: External ear normal.  Left Ear: External ear normal.  Nose: Nose normal.  Mouth/Throat: Oropharynx is clear and moist.  Eyes: Conjunctivae and EOM are normal. Pupils are equal, round, and reactive to light.  Neck: Normal range of motion. Neck supple. No JVD present. No tracheal deviation present. No thyromegaly present.  Cardiovascular: Normal rate, regular rhythm, normal heart sounds and intact distal pulses.  Exam reveals no gallop and no friction rub.   No murmur heard. No carotid aortic bruits peripheral pulses 2+ and symmetrical  Pulmonary/Chest: Effort normal and breath sounds normal. No stridor. No respiratory distress. He has no wheezes. He has no rales. He exhibits no  tenderness.  Abdominal: Soft. Bowel sounds are normal. He exhibits no distension and no mass. There is no tenderness. There is no rebound and no guarding.  Genitourinary: Rectum normal, prostate normal and penis normal. Guaiac negative stool. No penile tenderness.  Musculoskeletal: Normal range of motion. He exhibits no edema and no tenderness.  Lymphadenopathy:    He has no cervical adenopathy.  Neurological: He is alert and oriented to person, place, and time. He has normal reflexes. No cranial nerve deficit. He exhibits normal muscle tone.  Skin: Skin is warm and dry. No rash noted. No erythema. No pallor.  Total body skin exam normal  Psychiatric: He has a normal mood and affect. His behavior is normal. Judgment and thought content normal.          Assessment & Plan:  Overweight,,,,,,,, continue diet and exercise program  Hyperlipidemia continue to Lipitor and aspirin  History of anxiety and depression continue Celexa and Ativan  IBS Librax 2 tabs twice a day from GI  Steroid nasal spray when necessary

## 2014-01-23 NOTE — Patient Instructions (Addendum)
Continue current medication  Continue diet and weight loss  Walk 20 minutes daily  Return in one year for general physical examination sooner if any problems

## 2014-01-30 ENCOUNTER — Other Ambulatory Visit: Payer: Self-pay | Admitting: Family Medicine

## 2014-03-19 ENCOUNTER — Ambulatory Visit (INDEPENDENT_AMBULATORY_CARE_PROVIDER_SITE_OTHER): Payer: No Typology Code available for payment source | Admitting: Family Medicine

## 2014-03-19 ENCOUNTER — Encounter: Payer: Self-pay | Admitting: Family Medicine

## 2014-03-19 VITALS — BP 110/80 | Temp 98.2°F | Wt 234.0 lb

## 2014-03-19 DIAGNOSIS — F329 Major depressive disorder, single episode, unspecified: Secondary | ICD-10-CM

## 2014-03-19 DIAGNOSIS — E663 Overweight: Secondary | ICD-10-CM

## 2014-03-19 DIAGNOSIS — F3289 Other specified depressive episodes: Secondary | ICD-10-CM

## 2014-03-19 NOTE — Progress Notes (Signed)
   Subjective:    Patient ID: Kevin Young, male    DOB: [birth year], 58 y.o.   MRN: [id number]  HPI Mr. Montellano is a 58 year old male who comes in today to discuss a DOT examination and form that he got.  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  He takes the Celexa which is not a benzodiazepine one nightly at bedtime for many years because of history of chronic mild depression.  He takes Ativan twice daily but only when necessary. He's willing to stop the Ativan so he can keep driving. Also because of his weight they recommend that he have a sleep apnea study. We will go ahead and set this up and pulmonary  In the meantime since he stopping the Ativan I think it's okay for him to drive.   Review of Systems Review of systems otherwise negative no symptoms of sleep apnea but because of his weight that is a possible diagnosis    Objective:   Physical Exam Well-developed well-nourished male no acute distress vital signs stable he is afebrile BP today 110/80 weight 234 pounds       Assessment & Plan:  History of mild depression......... okay to continue the Celexa  History of occasional anxiety episodes......... stop the Ativan because he cannot take that and drive  Overweight pulmonary consult rule out sleep apnea.

## 2014-03-19 NOTE — Progress Notes (Signed)
Pre visit review using our clinic review tool, if applicable. No additional management support is needed unless otherwise documented below in the visit note. 

## 2014-03-19 NOTE — Patient Instructions (Signed)
Stop the Ativan completely  Okay to continue the Celexa  We will set up a sleep study ASAP

## 2014-04-16 ENCOUNTER — Encounter: Payer: Self-pay | Admitting: Pulmonary Disease

## 2014-04-16 ENCOUNTER — Ambulatory Visit (INDEPENDENT_AMBULATORY_CARE_PROVIDER_SITE_OTHER): Payer: No Typology Code available for payment source | Admitting: Pulmonary Disease

## 2014-04-16 VITALS — BP 120/82 | HR 80 | Temp 97.7°F | Ht 65.5 in | Wt 231.1 lb

## 2014-04-16 DIAGNOSIS — R0683 Snoring: Secondary | ICD-10-CM

## 2014-04-16 DIAGNOSIS — R0989 Other specified symptoms and signs involving the circulatory and respiratory systems: Secondary | ICD-10-CM

## 2014-04-16 DIAGNOSIS — G4733 Obstructive sleep apnea (adult) (pediatric): Secondary | ICD-10-CM | POA: Insufficient documentation

## 2014-04-16 DIAGNOSIS — R0609 Other forms of dyspnea: Secondary | ICD-10-CM

## 2014-04-16 NOTE — Progress Notes (Signed)
Subjective:    Patient ID: Kevin Young, male    DOB: [birth year], 58 y.o.   MRN: [id number]  HPI The patient is a 58 year old male who I've been asked to see for possible obstructive sleep apnea.  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  He does have a history of snoring, but no one has mentioned an abnormal breathing pattern during sleep. However, he and his wife sleep in different rooms. He has frequent awakenings at night, but blames this on his pad and urination. Overall, he feels that he is rested in the mornings upon arising. He denies sleep pressure during the day except after lunch if he has had a heavy meal. He does get sleepy however watching TV in the evenings. He denies any sleepiness with driving. The patient has lost about 17 pounds over the last one year, and his Epworth score today is 6.   Sleep Questionnaire What time do you typically go to bed?( Between what hours) 8:30 -10:30pm 8:30 -10:30pm at 1350 on 04/16/14 by Lilli Few, CMA How long does it take you to fall asleep? 15-30 mins 15-30 mins at 1350 on 04/16/14 by Lilli Few, CMA How many times during the night do you wake up? 3 3 at 1350 on 04/16/14 by Lilli Few, CMA What time do you get out of bed to start your day? 0600 0600 at 1350 on 04/16/14 by Lilli Few, CMA Do you drive or operate heavy machinery in your occupation? Yes Yes at 1350 on 04/16/14 by Lilli Few, CMA How much has your weight changed (up or down) over the past two years? (In pounds) 17 lb (7.711 kg) 17 lb (7.711 kg) at 1350 on 04/16/14 by Lilli Few, CMA Have you ever had a sleep study before? No No at 1350 on 04/16/14 by Lilli Few, CMA Do you currently use CPAP? No No at 1350 on 04/16/14 by Lilli Few, CMA Do you wear oxygen at any time?  No No at 1350 on 04/16/14 by Lilli Few, CMA   Review of Systems  Constitutional: Negative for fever and unexpected weight change.  HENT: Positive for congestion. Negative for dental problem, ear pain, nosebleeds, postnasal drip, rhinorrhea, sinus pressure, sneezing, sore throat and trouble swallowing.   Eyes: Negative for redness and itching.  Respiratory: Negative for cough, chest tightness, shortness of breath and wheezing.   Cardiovascular: Negative for palpitations and leg swelling.  Gastrointestinal: Negative for nausea and vomiting.  Genitourinary: Negative for dysuria.  Musculoskeletal: Negative for joint swelling.  Skin: Negative for rash.  Neurological: Negative for headaches.  Hematological: Does not bruise/bleed easily.  Psychiatric/Behavioral: Positive for dysphoric mood. The patient is nervous/anxious.        Objective:   Physical Exam Constitutional:  Obese male, no acute distress  HENT:  Nares patent without discharge, +septal deviation to the left with narrowing  Oropharynx without exudate, palate and uvula are moderately elongated  Eyes:  Perrla, eomi, no scleral icterus  Neck:  No JVD, no TMG  Cardiovascular:  Normal rate, regular rhythm, no rubs or gallops.  No murmurs        Intact distal pulses  Pulmonary :  Normal breath sounds, no stridor or respiratory distress   No rales, rhonchi, or wheezing  Abdominal:  Soft, nondistended, bowel sounds present.  No tenderness noted.  Musculoskeletal:  No lower extremity edema noted.  Lymph Nodes:  No cervical lymphadenopathy noted  Skin:  No cyanosis noted  Neurologic:  Alert, appropriate, moves all 4 extremities without obvious deficit.         Assessment & Plan:

## 2014-04-16 NOTE — Assessment & Plan Note (Signed)
The patient has a history of snoring, but it is unclear whether he has sleep apnea or not. He is certainly at risk given his weight and neck size, and some of his history is suspicious. At this point, he will need a sleep study for documentation, and the patient is agreeable to this approach. I will see him back once this study is complete.

## 2014-04-16 NOTE — Patient Instructions (Signed)
Will schedule for a sleep study, and will call once the results are available.  Work on weight reduction.  

## 2014-04-19 ENCOUNTER — Other Ambulatory Visit: Payer: Self-pay | Admitting: Family Medicine

## 2014-06-06 ENCOUNTER — Ambulatory Visit (HOSPITAL_BASED_OUTPATIENT_CLINIC_OR_DEPARTMENT_OTHER): Payer: No Typology Code available for payment source | Attending: Pulmonary Disease

## 2014-06-06 DIAGNOSIS — G4733 Obstructive sleep apnea (adult) (pediatric): Secondary | ICD-10-CM | POA: Insufficient documentation

## 2014-06-06 DIAGNOSIS — R0683 Snoring: Secondary | ICD-10-CM

## 2014-06-17 DIAGNOSIS — G4730 Sleep apnea, unspecified: Secondary | ICD-10-CM

## 2014-06-17 DIAGNOSIS — G4710 Hypersomnia, unspecified: Secondary | ICD-10-CM

## 2014-06-17 NOTE — Sleep Study (Signed)
   NAME: Kevin Young DATE OF BIRTH:  Apr 17, 1956 MEDICAL RECORD NUMBER [id number]  LOCATION: [facility]  PHYSICIAN: Kathee Delton  DATE OF STUDY: 06/06/2014  SLEEP STUDY TYPE: Nocturnal Polysomnogram               REFERRING PHYSICIAN: Clance, Armando Reichert, MD  INDICATION FOR STUDY: Hypersomnia with sleep apnea  EPWORTH SLEEPINESS SCORE:  7 HEIGHT:    WEIGHT:      There is no weight on file to calculate BMI.  NECK SIZE: 18.5 in.  MEDICATIONS: Reviewed in the sleep record  SLEEP ARCHITECTURE: The patient had a total sleep time of 353 minutes, with no slow-wave sleep and only 57 minutes of REM. Sleep onset latency was normal at 27 minutes, and REM onset was very prolonged.  Sleep efficiency was mildly reduced at 86%.  RESPIRATORY DATA: The patient was found to have 29 apneas and 42 obstructive hypopneas, evening him an AHI of 12 events per hour. The events were clearly increased during supine REM, and there was loud snoring noted throughout.  OXYGEN DATA: There was oxygen desaturation transiently as low as 79% during the night. However, there was only 22 minutes spent less than 88%.  CARDIAC DATA: Occasional PVC noted  MOVEMENT/PARASOMNIA: No significant limb movements or abnormal behaviors were seen.  IMPRESSION/ RECOMMENDATION:    1) mild obstructive sleep apnea/hypopnea syndrome, with an AHI of 12 events per hour and oxygen desaturation as low as 79%. Treatment for this degree of sleep apnea can include a trial of weight loss alone, upper airway surgery, dental appliance, and also CPAP. Clinical correlation is suggested.  2) occasional PVC noted, but no clinically significant arrhythmias were seen     Kathee Delton Diplomate, American Board of Sleep Medicine  ELECTRONICALLY SIGNED ON:  06/17/2014, 5:59 PM [place] PH: [phone]   FX: [phone] [place]

## 2014-06-17 NOTE — Progress Notes (Signed)
Pt needs ov to review sleep study.  Thanks.  

## 2014-06-18 ENCOUNTER — Telehealth: Payer: Self-pay | Admitting: Pulmonary Disease

## 2014-06-18 NOTE — Progress Notes (Signed)
LMTCB X1 

## 2014-06-18 NOTE — Telephone Encounter (Signed)
Called pt. appt scheduled to discuss results 9/18 at 4:30. Nothing further needed

## 2014-06-18 NOTE — Progress Notes (Signed)
Pt scheduled to come in 9/18 for OV

## 2014-06-21 ENCOUNTER — Encounter: Payer: Self-pay | Admitting: Pulmonary Disease

## 2014-06-21 ENCOUNTER — Ambulatory Visit (INDEPENDENT_AMBULATORY_CARE_PROVIDER_SITE_OTHER): Payer: No Typology Code available for payment source | Admitting: Pulmonary Disease

## 2014-06-21 VITALS — BP 120/82 | HR 69 | Temp 98.3°F | Ht 65.5 in | Wt 229.0 lb

## 2014-06-21 DIAGNOSIS — G4733 Obstructive sleep apnea (adult) (pediatric): Secondary | ICD-10-CM

## 2014-06-21 NOTE — Assessment & Plan Note (Signed)
The patient has mild obstructive sleep apnea by his recent sleep study, and I have reviewed the various treatment options with him. I have outlined a conservative treatment with a trial of weight loss alone, as well as more aggressive treatment with either a dental appliance or CPAP.  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 If he decides not to do this, and he makes use to work aggressively on weight loss. At least this does not represent a significant impact to his cardiovascular health. The patient will think about his various options, and let me know if he decides to try CPAP.

## 2014-06-21 NOTE — Patient Instructions (Signed)
Work on weight loss If you decide to treat your mild sleep apnea more aggressively, would consider cpap.  Let me know.

## 2014-06-21 NOTE — Progress Notes (Signed)
   Subjective:    Patient ID: Kevin Young, male    DOB: 1956/02/01, 58 y.o.   MRN: [id number]  HPI The patient comes in today for followup after his recent sleep study. He was found to have mild OSA, with an AHI of 12 events per hour and transient desaturation as low as 79%. I have reviewed the study with him in detail, and answered all of his questions.   Review of Systems  Constitutional: Negative for fever and unexpected weight change.  HENT: Negative for congestion, dental problem, ear pain, nosebleeds, postnasal drip, rhinorrhea, sinus pressure, sneezing, sore throat and trouble swallowing.   Eyes: Negative for redness and itching.  Respiratory: Negative for cough, chest tightness, shortness of breath and wheezing.   Cardiovascular: Negative for palpitations and leg swelling.  Gastrointestinal: Negative for nausea and vomiting.  Genitourinary: Negative for dysuria.  Musculoskeletal: Negative for joint swelling.  Skin: Negative for rash.  Neurological: Negative for headaches.  Hematological: Does not bruise/bleed easily.  Psychiatric/Behavioral: Negative for dysphoric mood. The patient is not nervous/anxious.        Objective:   Physical Exam Obese male in no acute distress Nose without purulence or discharge noted Neck without lymphadenopathy or thyromegaly Lower extremities with minimal edema, no cyanosis Alert and oriented, moves all 4 extremities.       Assessment & Plan:

## 2014-07-03 ENCOUNTER — Telehealth: Payer: Self-pay | Admitting: Family Medicine

## 2014-07-03 NOTE — Telephone Encounter (Signed)
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 Sherren Mocha stating he is no longer taking Ativan, Celexa, and Librax.

## 2014-07-04 NOTE — Telephone Encounter (Signed)
Letter ready for pick up and patient is aware. 

## 2014-07-12 ENCOUNTER — Encounter: Payer: Self-pay | Admitting: Physician Assistant

## 2014-07-12 ENCOUNTER — Ambulatory Visit (INDEPENDENT_AMBULATORY_CARE_PROVIDER_SITE_OTHER): Payer: No Typology Code available for payment source | Admitting: Physician Assistant

## 2014-07-12 VITALS — BP 108/62 | HR 72 | Temp 97.8°F | Resp 18 | Wt 234.6 lb

## 2014-07-12 DIAGNOSIS — J309 Allergic rhinitis, unspecified: Secondary | ICD-10-CM

## 2014-07-12 MED ORDER — PREDNISONE 20 MG PO TABS: ORAL_TABLET | ORAL | Status: DC

## 2014-07-12 NOTE — Progress Notes (Signed)
Subjective:    Patient ID: Kevin Young, male    DOB: [birth year], 57 y.o.   MRN: [id number]  Sinus Problem This is a new problem. The current episode started in the past 7 days (3 days). The problem has been rapidly worsening since onset. There has been no fever. The pain is moderate. Associated symptoms include congestion, headaches and sinus pressure. Pertinent negatives include no chills, coughing, diaphoresis, ear pain, hoarse voice, neck pain, shortness of breath, sneezing, sore throat or swollen glands. Treatments tried: prednisone, dayquil, zyrtec, flonase. The treatment provided mild relief.   Pt has moderate to severe attacks of allergic rhinitis which are treated acutely with prednisone taper provided to him by his PCP. Pt has already begun this taper, however has ran out of prednisone and needs a refill.    Review of Systems  Constitutional: Negative for fever, chills and diaphoresis.  HENT: Positive for congestion, postnasal drip and sinus pressure. Negative for ear pain, hoarse voice, sneezing and sore throat.   Respiratory: Negative for cough and shortness of breath.   Cardiovascular: Negative for chest pain.  Gastrointestinal: Positive for vomiting (1 time this morning, from drainage.). Negative for nausea and diarrhea.  Musculoskeletal: Negative for neck pain.  Neurological: Positive for headaches. Negative for syncope.  All other systems reviewed and are negative.    Past Medical History  Diagnosis Date  . Hyperlipidemia   . IBS (irritable bowel syndrome)   . Allergy     History   Social History  . Marital Status: Married    Spouse Name: N/A    Number of Children: N/A  . Years of Education: N/A   Occupational History  . truck driver    Social History Main Topics  . Smoking status: Former Smoker    Types: Cigars    Quit date: 12/02/2009  . Smokeless tobacco: Former Systems developer    Types: [place] date: 08/23/2013  . Alcohol Use: 3.0 oz/week    5  Cans of beer per week     Comment: 7-10 a week  . Drug Use: No  . Sexual Activity: Not on file   Other Topics Concern  . Not on file   Social History Narrative  . No narrative on file    Past Surgical History  Procedure Laterality Date  . Tonsillectomy      Family History  Problem Relation Age of Onset  . GI problems Mother   . Cancer Father     skin  . Hypertension Father   . Glaucoma Father   . Migraines Sister   . Seizures Brother   . Depression Brother     No Known Allergies  Current Outpatient Prescriptions on File Prior to Visit  Medication Sig Dispense Refill  . aspirin 81 MG tablet Take 81 mg by mouth daily.        [name] atorvastatin (LIPITOR) 20 MG tablet TAKE 1/2 TABLET BY MOUTH EVERY DAY      . Biotin 10 MG CAPS Take 1 capsule by mouth daily.      . cetirizine (ZYRTEC) 10 MG tablet Take 10 mg by mouth daily.      . clidinium-chlordiazePOXIDE (LIBRAX) 5-2.5 MG per capsule TAKE 2 CAPSULES BY MOUTH 2 TIMES A DAY  360 capsule  5  . fluticasone (FLONASE) 50 MCG/ACT nasal spray Place 2 sprays into both nostrils daily.      . Melatonin 5 MG CAPS Take 2 capsules by mouth at bedtime.      [name]  RA KRILL OIL 500 MG CAPS Take 1 capsule by mouth daily.      . citalopram (CELEXA) 20 MG tablet TAKE 1 TABLET (20 MG TOTAL) BY MOUTH DAILY.  90 tablet  3   No current facility-administered medications on file prior to visit.    EXAM: BP 108/62  Pulse 72  Temp(Src) 97.8 F (36.6 C) (Oral)  Resp 18  Wt 234 lb 9.6 oz (106.414 kg)  SpO2 96%     Objective:   Physical Exam  Nursing note and vitals reviewed. Constitutional: He is oriented to person, place, and time. He appears well-developed and well-nourished. No distress.  HENT:  Head: Normocephalic and atraumatic.  Right Ear: External ear normal.  Left Ear: External ear normal.  Mouth/Throat: No oropharyngeal exudate.  Oropharynx is slightly erythematous with cobblestoning, no exudate. Nasal mucosa is boggy. Bilateral  TMs normal. Bilateral frontal and maxillary sinuses non-TTP.  Eyes: Conjunctivae and EOM are normal.  Neck: Normal range of motion. Neck supple.  Cardiovascular: Normal rate, regular rhythm and intact distal pulses.   Pulmonary/Chest: Effort normal and breath sounds normal. No stridor. No respiratory distress. He has no wheezes. He has no rales. He exhibits no tenderness.  Lymphadenopathy:    He has no cervical adenopathy.  Neurological: He is alert and oriented to person, place, and time.  Skin: Skin is warm and dry. He is not diaphoretic. No pallor.  Psychiatric: He has a normal mood and affect. His behavior is normal. Judgment and thought content normal.    Lab Results  Component Value Date   WBC 4.5 01/16/2014   HGB 15.1 01/16/2014   HCT 44.7 01/16/2014   PLT 181.0 01/16/2014   GLUCOSE 97 01/16/2014   CHOL 170 01/16/2014   TRIG 56.0 01/16/2014   HDL 56.10 01/16/2014   LDLDIRECT 171.0 11/18/2006   LDLCALC 103* 01/16/2014   ALT 32 01/16/2014   AST 25 01/16/2014   NA 138 01/16/2014   K 4.7 01/16/2014   CL 103 01/16/2014   CREATININE 1.1 01/16/2014   BUN 21 01/16/2014   CO2 30 01/16/2014   TSH 1.21 01/16/2014   PSA 0.58 01/16/2014   HGBA1C 6.0 11/18/2006         Assessment & Plan:  Susan was seen today for sinus problem.  Diagnoses and associated orders for this visit:  Allergic rhinitis, unspecified allergic rhinitis type Comments: Continue prednisone taper, continue nasal steroid, antihistamine, rest, push fluids. - predniSONE (DELTASONE) 20 MG tablet; Take as directed with a meal.    Encourage pt to push fluid hydration with water in order to help him recover faster.  Return precautions provided, and patient handout on sinusitis.  Plan to follow up as needed, or for worsening or persistent symptoms despite treatment.  Patient Instructions  Continue your prednisone taper as directed, make sure to take each dose with a meal to prevent nausea.  Plain Over the Counter Mucinex  (NOT Mucinex D) for thick secretions  Force NON dairy fluids, drinking plenty of water is best.    Over the Counter Flonase OR Nasacort AQ 1 spray in each nostril twice a day as needed. Use the "crossover" technique into opposite nostril spraying toward opposite ear @ 45 degree angle, not straight up into nostril.   Plain Over the Counter Allegra (NOT D )  160 daily , OR Loratidine 10 mg , OR Zyrtec 10 mg @ bedtime  as needed for itchy eyes & sneezing.  Saline Irrigation and Saline Sprays can also  help reduce symptoms.  If emergency symptoms discussed during visit developed, seek medical attention immediately.  Followup as needed, or for worsening or persistent symptoms despite treatment.

## 2014-07-12 NOTE — Patient Instructions (Addendum)
Continue your prednisone taper as directed, make sure to take each dose with a meal to prevent nausea.  Plain Over the Counter Mucinex (NOT Mucinex D) for thick secretions  Force NON dairy fluids, drinking plenty of water is best.    Over the Counter Flonase OR Nasacort AQ 1 spray in each nostril twice a day as needed. Use the "crossover" technique into opposite nostril spraying toward opposite ear @ 45 degree angle, not straight up into nostril.   Plain Over the Counter Allegra (NOT D )  160 daily , OR Loratidine 10 mg , OR Zyrtec 10 mg @ bedtime  as needed for itchy eyes & sneezing.  Saline Irrigation and Saline Sprays can also help reduce symptoms.  If emergency symptoms discussed during visit developed, seek medical attention immediately.  Followup as needed, or for worsening or persistent symptoms despite treatment.  Sinusitis Sinusitis is redness, soreness, and puffiness (inflammation) of the air pockets in the bones of your face (sinuses). The redness, soreness, and puffiness can cause air and mucus to get trapped in your sinuses. This can allow germs to grow and cause an infection.  HOME CARE   Drink enough fluids to keep your pee (urine) clear or pale yellow.  Use a humidifier in your home.  Run a hot shower to create steam in the bathroom. Sit in the bathroom with the door closed. Breathe in the steam 3-4 times a day.  Put a warm, moist washcloth on your face 3-4 times a day, or as told by your doctor.  Use salt water sprays (saline sprays) to wet the thick fluid in your nose. This can help the sinuses drain.  Only take medicine as told by your doctor. GET HELP RIGHT AWAY IF:   Your pain gets worse.  You have very bad headaches.  You are sick to your stomach (nauseous).  You throw up (vomit).  You are very sleepy (drowsy) all the time.  Your face is puffy (swollen).  Your vision changes.  You have a stiff neck.  You have trouble breathing. MAKE SURE YOU:    Understand these instructions.  Will watch your condition.  Will get help right away if you are not doing well or get worse. Document Released: 03/08/2008 Document Revised: 06/14/2012 Document Reviewed: 04/25/2012 [facility] Patient Information 2015 [place], [place]. This information is not intended to replace advice given to you by your health care provider. Make sure you discuss any questions you have with your health care provider.

## 2014-07-12 NOTE — Progress Notes (Signed)
Pre visit review using our clinic review tool, if applicable. No additional management support is needed unless otherwise documented below in the visit note. 

## 2014-07-16 ENCOUNTER — Encounter: Payer: Self-pay | Admitting: Family Medicine

## 2014-07-16 ENCOUNTER — Ambulatory Visit (INDEPENDENT_AMBULATORY_CARE_PROVIDER_SITE_OTHER): Payer: No Typology Code available for payment source | Admitting: Family Medicine

## 2014-07-16 VITALS — BP 120/80 | Temp 98.2°F | Wt 241.0 lb

## 2014-07-16 DIAGNOSIS — J301 Allergic rhinitis due to pollen: Secondary | ICD-10-CM

## 2014-07-16 MED ORDER — MONTELUKAST SODIUM 10 MG PO TABS: 10.0000 mg | ORAL_TABLET | Freq: Every day | ORAL | Status: DC

## 2014-07-16 NOTE — Progress Notes (Signed)
Pre visit review using our clinic review tool, if applicable. No additional management support is needed unless otherwise documented below in the visit note. 

## 2014-07-16 NOTE — Progress Notes (Signed)
   Subjective:    Patient ID: Kevin Young, male    DOB: 03-Aug-1956, 58 y.o.   MRN: [id number]  HPI [name] is a 58 year old male nonsmoker who comes in today for evaluation of 2 problems  He's had a long-standing history of allergic rhinitis manifested by a congestion postnasal drip no wheezing. He thinks he has sinus disease. He went to see an ENT last year and they advised him to use topical therapy. He has no fever chills or facial pain or discolored drainage. He points to the right side of his nose as a source of his discomfort.  He has a lesion on his left hand that is healed however has formed a central pit and continues to bleed off and on.   Review of Systems Review of systems otherwise negative    Objective:   Physical Exam Well-developed well-nourished male no acute distress vital signs stable he is afebrile HEENT negative neck was supple no adenopathy he does have a slight septal deviation to the right but is minor.       Assessment & Plan:  Allergic rhinitis.......... treat symptomatically with antihistamines....... steroid nasal spray.... Singulair............ stop the pseudoephedrine.......... ENT consult if systems persist Nonhealing lesion left hand.... return for removal ...........[name]

## 2014-07-16 NOTE — Patient Instructions (Signed)
Singulair 10 mg...........[name] 1 at bedtime  Steroid nasal spray.......... one-shot each nostril at bedtime  Zyrtec plain 10 mg.........[name] 1 at bedtime  Claritin plain 10 mg.........[name] 1 in the morning  If after 3-4 weeks you don't see much improvement I would recommend he see Dr. Melissa Montane at [facility]  Return sometime in the next week or 2 to have the lesion on her left hand removed

## 2014-07-30 ENCOUNTER — Ambulatory Visit (INDEPENDENT_AMBULATORY_CARE_PROVIDER_SITE_OTHER): Payer: No Typology Code available for payment source | Admitting: Family Medicine

## 2014-07-30 ENCOUNTER — Encounter: Payer: Self-pay | Admitting: Family Medicine

## 2014-07-30 DIAGNOSIS — D0472 Carcinoma in situ of skin of left lower limb, including hip: Secondary | ICD-10-CM

## 2014-07-30 DIAGNOSIS — L989 Disorder of the skin and subcutaneous tissue, unspecified: Secondary | ICD-10-CM

## 2014-07-30 DIAGNOSIS — D0462 Carcinoma in situ of skin of left upper limb, including shoulder: Secondary | ICD-10-CM | POA: Insufficient documentation

## 2014-07-30 NOTE — Progress Notes (Signed)
   Subjective:    Patient ID: Kevin Young, male    DOB: 19-Sep-1956, 58 y.o.   MRN: [id number]  HPI Kevin Young is a 58 year old male who comes in today for removal of the lesion on the dorsum of his left hand  He had a lesion there for many years. About 4 weeks ago he began didn't notice it changing. It increased in size and became ulcerated. He comes in for removal. He's had a history of dysplastic nevi in the past   Review of Systems Review of systems otherwise negative    Objective:   Physical Exam  Well-developed well-nourished male in no acute distress vital signs stable he's afebrile examination hand shows an 8 mm x 8 mm raised lesion with a central ulceration.  After informed consent lesion was cleaned with alcohol ....... anesthetized with 1% Xylocaine with epinephrine and excised with 3 mm margins. The base was cauterized Band-Aid was applied. He tolerated the procedure complications. 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 path pending

## 2014-07-30 NOTE — Patient Instructions (Signed)
Remove the Band-Aid tomorrow  Within 2 weeks I will call you the report

## 2014-08-02 ENCOUNTER — Other Ambulatory Visit: Payer: Self-pay | Admitting: Family Medicine

## 2014-08-02 DIAGNOSIS — C44619 Basal cell carcinoma of skin of left upper limb, including shoulder: Secondary | ICD-10-CM

## 2014-08-09 ENCOUNTER — Telehealth: Payer: Self-pay | Admitting: Family Medicine

## 2014-08-09 DIAGNOSIS — J309 Allergic rhinitis, unspecified: Secondary | ICD-10-CM

## 2014-08-09 NOTE — Telephone Encounter (Signed)
[id number] - [place], [place] - [address] is requesting re-fill on predniSONE (DELTASONE) 20 MG tablet

## 2014-08-12 MED ORDER — PREDNISONE 20 MG PO TABS: ORAL_TABLET | ORAL | Status: DC

## 2014-09-17 ENCOUNTER — Other Ambulatory Visit: Payer: Self-pay | Admitting: Dermatology

## 2014-10-22 ENCOUNTER — Ambulatory Visit: Payer: No Typology Code available for payment source | Admitting: Family Medicine

## 2014-11-04 ENCOUNTER — Ambulatory Visit (INDEPENDENT_AMBULATORY_CARE_PROVIDER_SITE_OTHER): Payer: No Typology Code available for payment source | Admitting: Family Medicine

## 2014-11-04 ENCOUNTER — Encounter: Payer: Self-pay | Admitting: Family Medicine

## 2014-11-04 VITALS — BP 132/84 | HR 70 | Temp 98.0°F | Ht 65.5 in | Wt 228.9 lb

## 2014-11-04 DIAGNOSIS — N451 Epididymitis: Secondary | ICD-10-CM | POA: Insufficient documentation

## 2014-11-04 MED ORDER — DOXYCYCLINE HYCLATE 100 MG PO TABS: 100.0000 mg | ORAL_TABLET | Freq: Two times a day (BID) | ORAL | Status: DC

## 2014-11-04 NOTE — Patient Instructions (Signed)
Doxycycline 100 mg......... take one twice daily until the bottle is gone,,,,,,,,, refills 1  Return when necessary

## 2014-11-04 NOTE — Progress Notes (Signed)
   Subjective:    Patient ID: Kevin Young, male    DOB: 05-17-1956, 59 y.o.   MRN: [id number]  HPI Kevin Young is a 59 year old male who comes in today suspecting he has a hernia  He says he said tenderness above the left scrotum for about a week to 2 weeks. No history of trauma no bulging   Review of Systems    review of systems otherwise negative Objective:   Physical Exam  Well-developed well-nourished male no acute distress vital signs stable he is afebrile inspection appears normal palpation normal testicle normal tenderness of the epididymis no hernia      Assessment & Plan:  Epididymitis. Treat with doxycycline......[name]

## 2014-11-04 NOTE — Progress Notes (Signed)
Pre visit review using our clinic review tool, if applicable. No additional management support is needed unless otherwise documented below in the visit note. 

## 2014-12-13 ENCOUNTER — Other Ambulatory Visit: Payer: Worker's Compensation

## 2014-12-13 DIAGNOSIS — M5442 Lumbago with sciatica, left side: Secondary | ICD-10-CM | POA: Diagnosis not present

## 2014-12-23 ENCOUNTER — Emergency Department (HOSPITAL_COMMUNITY): Payer: No Typology Code available for payment source

## 2014-12-23 ENCOUNTER — Encounter (HOSPITAL_COMMUNITY): Payer: Self-pay | Admitting: Emergency Medicine

## 2014-12-23 ENCOUNTER — Emergency Department (HOSPITAL_COMMUNITY)
Admission: EM | Admit: 2014-12-23 | Discharge: 2014-12-23 | Disposition: A | Discharge status: Home / Self Care | Payer: No Typology Code available for payment source | Attending: Emergency Medicine | Admitting: Emergency Medicine

## 2014-12-23 DIAGNOSIS — Z7952 Long term (current) use of systemic steroids: Secondary | ICD-10-CM | POA: Insufficient documentation

## 2014-12-23 DIAGNOSIS — Z87891 Personal history of nicotine dependence: Secondary | ICD-10-CM | POA: Diagnosis not present

## 2014-12-23 DIAGNOSIS — K5721 Diverticulitis of large intestine with perforation and abscess with bleeding: Secondary | ICD-10-CM | POA: Diagnosis not present

## 2014-12-23 DIAGNOSIS — E785 Hyperlipidemia, unspecified: Secondary | ICD-10-CM | POA: Insufficient documentation

## 2014-12-23 DIAGNOSIS — R1030 Lower abdominal pain, unspecified: Secondary | ICD-10-CM | POA: Diagnosis present

## 2014-12-23 DIAGNOSIS — Z791 Long term (current) use of non-steroidal anti-inflammatories (NSAID): Secondary | ICD-10-CM | POA: Diagnosis not present

## 2014-12-23 DIAGNOSIS — Z8719 Personal history of other diseases of the digestive system: Secondary | ICD-10-CM | POA: Insufficient documentation

## 2014-12-23 DIAGNOSIS — Z7951 Long term (current) use of inhaled steroids: Secondary | ICD-10-CM | POA: Diagnosis not present

## 2014-12-23 DIAGNOSIS — K5732 Diverticulitis of large intestine without perforation or abscess without bleeding: Secondary | ICD-10-CM

## 2014-12-23 DIAGNOSIS — Z79899 Other long term (current) drug therapy: Secondary | ICD-10-CM | POA: Diagnosis not present

## 2014-12-23 DIAGNOSIS — Z7982 Long term (current) use of aspirin: Secondary | ICD-10-CM | POA: Diagnosis not present

## 2014-12-23 DIAGNOSIS — N200 Calculus of kidney: Secondary | ICD-10-CM

## 2014-12-23 HISTORY — DX: Calculus of kidney: N20.0

## 2014-12-23 LAB — CBC WITH DIFFERENTIAL/PLATELET
BASOS PCT: 0 % (ref 0–1)
Basophils Absolute: 0 10*3/uL (ref 0.0–0.1)
Eosinophils Absolute: 0.6 10*3/uL (ref 0.0–0.7)
Eosinophils Relative: 4 % (ref 0–5)
HCT: 42.8 % (ref 39.0–52.0)
Hemoglobin: 14.7 g/dL (ref 13.0–17.0)
Lymphocytes Relative: 17 % (ref 12–46)
Lymphs Abs: 2.4 10*3/uL (ref 0.7–4.0)
MCH: 30 pg (ref 26.0–34.0)
MCHC: 34.3 g/dL (ref 30.0–36.0)
MCV: 87.3 fL (ref 78.0–100.0)
MONOS PCT: 15 % — AB (ref 3–12)
Monocytes Absolute: 2.1 10*3/uL — ABNORMAL HIGH (ref 0.1–1.0)
NEUTROS ABS: 9.1 10*3/uL — AB (ref 1.7–7.7)
Neutrophils Relative %: 64 % (ref 43–77)
Platelets: 168 10*3/uL (ref 150–400)
RBC: 4.9 MIL/uL (ref 4.22–5.81)
RDW: 14 % (ref 11.5–15.5)
WBC: 14.2 10*3/uL — AB (ref 4.0–10.5)

## 2014-12-23 LAB — COMPREHENSIVE METABOLIC PANEL
ALK PHOS: 50 U/L (ref 39–117)
ALT: 33 U/L (ref 0–53)
AST: 18 U/L (ref 0–37)
Albumin: 3.3 g/dL — ABNORMAL LOW (ref 3.5–5.2)
Anion gap: 7 (ref 5–15)
BUN: 17 mg/dL (ref 6–23)
CO2: 28 mmol/L (ref 19–32)
Calcium: 8.6 mg/dL (ref 8.4–10.5)
Chloride: 103 mmol/L (ref 96–112)
Creatinine, Ser: 0.95 mg/dL (ref 0.50–1.35)
GFR calc non Af Amer: >90 mL/min (ref >90)
GLUCOSE: 101 mg/dL — AB (ref 70–99)
POTASSIUM: 3.6 mmol/L (ref 3.5–5.1)
SODIUM: 138 mmol/L (ref 135–145)
TOTAL PROTEIN: 5.2 g/dL — AB (ref 6.0–8.3)
Total Bilirubin: 0.8 mg/dL (ref 0.3–1.2)

## 2014-12-23 LAB — URINALYSIS, ROUTINE W REFLEX MICROSCOPIC
BILIRUBIN URINE: NEGATIVE
GLUCOSE, UA: NEGATIVE mg/dL
HGB URINE DIPSTICK: NEGATIVE
KETONES UR: NEGATIVE mg/dL
LEUKOCYTES UA: NEGATIVE
Nitrite: NEGATIVE
PH: 7 (ref 5.0–8.0)
Protein, ur: NEGATIVE mg/dL
Specific Gravity, Urine: 1.011 (ref 1.005–1.030)
Urobilinogen, UA: 0.2 mg/dL (ref 0.0–1.0)

## 2014-12-23 LAB — LIPASE, BLOOD: LIPASE: 28 U/L (ref 11–59)

## 2014-12-23 MED ORDER — OXYCODONE-ACETAMINOPHEN 5-325 MG PO TABS: 1.0000 | ORAL_TABLET | ORAL | Status: DC | PRN

## 2014-12-23 MED ORDER — METRONIDAZOLE 500 MG PO TABS: 500.0000 mg | ORAL_TABLET | Freq: Three times a day (TID) | ORAL | Status: DC

## 2014-12-23 MED ORDER — IOHEXOL 300 MG/ML  SOLN
25.0000 mL | INTRAMUSCULAR | Status: AC
  Administered 2014-12-23 (×2): 25 mL via ORAL

## 2014-12-23 MED ORDER — IOHEXOL 300 MG/ML  SOLN
100.0000 mL | Freq: Once | INTRAMUSCULAR | Status: AC | PRN
  Administered 2014-12-23: 100 mL via INTRAVENOUS

## 2014-12-23 MED ORDER — ONDANSETRON HCL 4 MG PO TABS: 4.0000 mg | ORAL_TABLET | Freq: Four times a day (QID) | ORAL | Status: DC

## 2014-12-23 MED ORDER — MORPHINE SULFATE 4 MG/ML IJ SOLN
4.0000 mg | Freq: Once | INTRAMUSCULAR | Status: AC
  Administered 2014-12-23: 4 mg via INTRAVENOUS
  Filled 2014-12-23: qty 1

## 2014-12-23 MED ORDER — METRONIDAZOLE 500 MG PO TABS
500.0000 mg | ORAL_TABLET | Freq: Once | ORAL | Status: AC
  Administered 2014-12-23: 500 mg via ORAL
  Filled 2014-12-23: qty 1

## 2014-12-23 MED ORDER — CIPROFLOXACIN HCL 500 MG PO TABS
500.0000 mg | ORAL_TABLET | Freq: Once | ORAL | Status: AC
  Administered 2014-12-23: 500 mg via ORAL
  Filled 2014-12-23: qty 1

## 2014-12-23 MED ORDER — CIPROFLOXACIN HCL 500 MG PO TABS: 500.0000 mg | ORAL_TABLET | Freq: Two times a day (BID) | ORAL | Status: DC

## 2014-12-23 NOTE — ED Notes (Signed)
Pt reports mild constipation since Saturday causing lower abdominal pain. PT reports he took 4 Senna today. Reports not drinking much water. Pt has history of IBS.

## 2014-12-23 NOTE — Discharge Instructions (Signed)
Diverticulitis °Diverticulitis is when small pockets that have formed in your colon (large intestine) become infected or swollen. °HOME CARE °· Follow your doctor's instructions. °· Follow a special diet if told by your doctor. °· When you feel better, your doctor may tell you to change your diet. You may be told to eat a lot of fiber. Fruits and vegetables are good sources of fiber. Fiber makes it easier to poop (have bowel movements). °· Take supplements or probiotics as told by your doctor. °· Only take medicines as told by your doctor. °· Keep all follow-up visits with your doctor. °GET HELP IF: °· Your pain does not get better. °· You have a hard time eating food. °· You are not pooping like normal. °GET HELP RIGHT AWAY IF: °· Your pain gets worse. °· Your problems do not get better. °· Your problems suddenly get worse. °· You have a fever. °· You keep throwing up (vomiting). °· You have bloody or black, tarry poop (stool). °MAKE SURE YOU:  °· Understand these instructions. °· Will watch your condition. °· Will get help right away if you are not doing well or get worse. °Document Released: 03/08/2008 Document Revised: 09/25/2013 Document Reviewed: 08/15/2013 °[employer]® Patient Information ©2015 [employer]. This information is not intended to replace advice given to you by your health care provider. Make sure you discuss any questions you have with your health care provider. ° °

## 2014-12-23 NOTE — ED Notes (Signed)
Pt now in room.   

## 2014-12-23 NOTE — ED Notes (Signed)
Patient transported to CT 

## 2014-12-23 NOTE — ED Provider Notes (Signed)
CSN: [id number]     Arrival date & time 12/23/14  0017 History   First MD Initiated Contact with Patient 12/23/14 [phone]     Chief Complaint  Patient presents with  . Abdominal Pain     (Consider location/radiation/quality/duration/timing/severity/associated sxs/prior Treatment) Patient is a 59 y.o. male presenting with abdominal pain. The history is provided by the patient. No language interpreter was used.  Abdominal Pain Pain location:  LLQ, RLQ and suprapubic Pain quality: aching   Associated symptoms: no chest pain, no constipation, no diarrhea, no dysuria, no fever, no nausea, no shortness of breath and no vomiting   Associated symptoms comment:  Patient with a history of IBS with lower abdominal pain for the past several days. No diarrhea, bloody stools, nausea, vomiting or fever. He denies urinary symptoms. He reports colonoscopies in the past with finding of diverticula and benign polyps.    Past Medical History  Diagnosis Date  . Hyperlipidemia   . IBS (irritable bowel syndrome)   . Allergy    Past Surgical History  Procedure Laterality Date  . Tonsillectomy     Family History  Problem Relation Age of Onset  . GI problems Mother   . Cancer Father     skin  . Hypertension Father   . Glaucoma Father   . Migraines Sister   . Seizures Brother   . Depression Brother    History  Substance Use Topics  . Smoking status: Former Smoker    Types: Cigars    Quit date: 12/02/2009  . Smokeless tobacco: Former Systems developer    Types: [place] date: 08/23/2013  . Alcohol Use: 3.0 oz/week    5 Cans of beer per week     Comment: 7-10 a week    Review of Systems  Constitutional: Negative for fever.  Respiratory: Negative for shortness of breath.   Cardiovascular: Negative for chest pain.  Gastrointestinal: Positive for abdominal pain. Negative for nausea, vomiting, diarrhea, constipation and blood in stool.  Genitourinary: Negative.  Negative for dysuria.  Musculoskeletal:  Negative.  Negative for myalgias.  Neurological: Negative.  Negative for weakness.      Allergies  Review of patient's allergies indicates no known allergies.  Home Medications   Prior to Admission medications   Medication Sig Start Date End Date Taking? Authorizing Provider  aspirin 81 MG tablet Take 81 mg by mouth daily.     Yes Historical Provider, MD  atorvastatin (LIPITOR) 20 MG tablet TAKE 1/2 TABLET BY MOUTH EVERY DAY 01/30/14  Yes Dorena Cookey, MD  Biotin 10 MG CAPS Take 1 capsule by mouth daily.   Yes Historical Provider, MD  fluticasone (FLONASE) 50 MCG/ACT nasal spray Place 2 sprays into both nostrils daily.   Yes Historical Provider, MD  Melatonin 5 MG CAPS Take 2 capsules by mouth at bedtime.   Yes Historical Provider, MD  montelukast (SINGULAIR) 10 MG tablet Take 1 tablet (10 mg total) by mouth at bedtime. 07/16/14  Yes Dorena Cookey, MD  naproxen sodium (ANAPROX) 550 MG tablet Take 550 mg by mouth 2 (two) times daily with a meal.   Yes Historical Provider, MD  RA KRILL OIL 500 MG CAPS Take 1 capsule by mouth daily.   Yes Historical Provider, MD  clidinium-chlordiazePOXIDE (LIBRAX) 5-2.5 MG per capsule TAKE 2 CAPSULES BY MOUTH 2 TIMES A DAY Patient not taking: Reported on 12/23/2014 04/19/14   Dorena Cookey, MD  doxycycline (VIBRA-TABS) 100 MG tablet Take 1 tablet (  100 mg total) by mouth 2 (two) times daily. Patient not taking: Reported on 12/23/2014 11/04/14   Dorena Cookey, MD  predniSONE (DELTASONE) 20 MG tablet Take as directed with a meal. 08/12/14   Dorena Cookey, MD   BP 125/73 mmHg  Pulse 70  Temp(Src) 98.2 F (36.8 C) (Oral)  Resp 16  Ht 5\' 6"  (1.676 m)  Wt 238 lb (107.956 kg)  BMI 38.43 kg/m2  SpO2 97% Physical Exam  Constitutional: He appears well-developed and well-nourished.  HENT:  Head: Normocephalic.  Neck: Normal range of motion. Neck supple.  Cardiovascular: Normal rate and regular rhythm.   Pulmonary/Chest: Effort normal and breath sounds  normal.  Abdominal: Soft. Bowel sounds are normal. There is tenderness. There is no rebound and no guarding.  Greatest tenderness is over the suprapubic abdomen with tenderness extending to right and left lower quadrants.  Musculoskeletal: Normal range of motion.  Neurological: He is alert. No cranial nerve deficit.  Skin: Skin is warm and dry. No rash noted.  Psychiatric: He has a normal mood and affect.    ED Course  Procedures (including critical care time) Labs Review Labs Reviewed  CBC WITH DIFFERENTIAL/PLATELET - Abnormal; Notable for the following:    WBC 14.2 (*)    Neutro Abs 9.1 (*)    Monocytes Relative 15 (*)    Monocytes Absolute 2.1 (*)    All other components within normal limits  COMPREHENSIVE METABOLIC PANEL - Abnormal; Notable for the following:    Glucose, Bld 101 (*)    Total Protein 5.2 (*)    Albumin 3.3 (*)    All other components within normal limits  URINALYSIS, ROUTINE W REFLEX MICROSCOPIC - Abnormal; Notable for the following:    Color, Urine STRAW (*)    All other components within normal limits  LIPASE, BLOOD    Imaging Review Ct Abdomen Pelvis W Contrast  12/23/2014   CLINICAL DATA:  Initial evaluation for acute abdominal pain.  EXAM: CT ABDOMEN AND PELVIS WITH CONTRAST  TECHNIQUE: Multidetector CT imaging of the abdomen and pelvis was performed using the standard protocol following bolus administration of intravenous contrast.  CONTRAST:  1 OMNIPAQUE IOHEXOL 300 MG/ML SOLN, 15mL OMNIPAQUE IOHEXOL 300 MG/ML SOLN  COMPARISON:  Prior CT from 03/10/2007  FINDINGS: Minimal dependent atelectasis present within the partially visualized lung bases. Visualized lungs are otherwise clear. No pleural or pericardial effusion.  Focal hypodensity adjacent to the fissure for ligamentum teres likely reflects focal fat deposition. Mild diffuse hypoattenuation of the liver suggestive of diffuse steatosis. Gallbladder within normal limits. No biliary dilatation. Spleen,  adrenal glands, and pancreas are within normal limits.  4 mm nonobstructive stone present within the lower pole of the right kidney. Kidneys are otherwise unremarkable without evidence of hydronephrosis or focal enhancing renal mass. No obstructive calculi seen along the course of either renal collecting system.  Stomach within normal limits. Small bowel of normal caliber without evidence of obstruction or acute inflammatory changes. Appendix is normal.  There is prominent inflammatory stranding about several sigmoid diverticula in the left lower quadrant, compatible with acute sigmoid diverticulitis. No free air to suggest perforation. No diverticular abscess.  Bladder within normal limits.  Prostate normal.  No free air identified. Trace free fluid adjacent to the inflamed sigmoid colon. No pathologically enlarged intra-abdominal pelvic lymph nodes identified. Normal intravascular enhancement seen within the abdomen and pelvis.  No acute osseous abnormality. No worrisome lytic or blastic osseous lesions.  IMPRESSION: 1. Findings consistent  with acute sigmoid diverticulitis. No evidence for perforation or other complication. 2. No other acute intra-abdominal or pelvic process identified. 3. 4 mm nonobstructive right renal calculus. 4. Hepatic steatosis.   Electronically Signed   By: Jeannine Boga M.D.   On: 12/23/2014 03:29     EKG Interpretation None      MDM   Final diagnoses:  None    1. Sigmoid diverticulitis  He appears stable; pain improved, VSS, afebrile. Able to tolerate oral medications. Feel he can be discharged home to follow up with [name] this week for recheck.     Charlann Lange, PA-C 12/23/14 [id number]  Everlene Balls, MD 12/23/14 [phone]

## 2014-12-23 NOTE — ED Notes (Signed)
C/o mid lower abd pain since Saturday.  Denies nausea, vomiting, and diarrhea.  Reports several small bowel movements on Sunday but states it wasn't very much.  Pt concerned that he may be constipated.  Denies urinary complaint.

## 2015-01-07 ENCOUNTER — Ambulatory Visit (INDEPENDENT_AMBULATORY_CARE_PROVIDER_SITE_OTHER): Payer: No Typology Code available for payment source | Admitting: Family Medicine

## 2015-01-07 ENCOUNTER — Encounter: Payer: Self-pay | Admitting: Family Medicine

## 2015-01-07 VITALS — BP 102/82 | HR 62 | Temp 97.9°F | Ht 65.5 in | Wt 228.0 lb

## 2015-01-07 DIAGNOSIS — E785 Hyperlipidemia, unspecified: Secondary | ICD-10-CM | POA: Diagnosis not present

## 2015-01-07 DIAGNOSIS — D72829 Elevated white blood cell count, unspecified: Secondary | ICD-10-CM

## 2015-01-07 DIAGNOSIS — K5732 Diverticulitis of large intestine without perforation or abscess without bleeding: Secondary | ICD-10-CM

## 2015-01-07 LAB — CBC WITH DIFFERENTIAL/PLATELET
BASOS PCT: 0.5 % (ref 0.0–3.0)
Basophils Absolute: 0 10*3/uL (ref 0.0–0.1)
Eosinophils Absolute: 0.1 10*3/uL (ref 0.0–0.7)
Eosinophils Relative: 1.4 % (ref 0.0–5.0)
HCT: 44.6 % (ref 39.0–52.0)
Hemoglobin: 15.1 g/dL (ref 13.0–17.0)
Lymphocytes Relative: 23.2 % (ref 12.0–46.0)
Lymphs Abs: 1.8 10*3/uL (ref 0.7–4.0)
MCHC: 33.8 g/dL (ref 30.0–36.0)
MCV: 87.8 fl (ref 78.0–100.0)
MONO ABS: 0.6 10*3/uL (ref 0.1–1.0)
Monocytes Relative: 8.2 % (ref 3.0–12.0)
Neutro Abs: 5.2 10*3/uL (ref 1.4–7.7)
Neutrophils Relative %: 66.7 % (ref 43.0–77.0)
Platelets: 311 10*3/uL (ref 150.0–400.0)
RBC: 5.07 Mil/uL (ref 4.22–5.81)
RDW: 14.9 % (ref 11.5–15.5)
WBC: 7.8 10*3/uL (ref 4.0–10.5)

## 2015-01-07 LAB — LIPID PANEL
CHOLESTEROL: 187 mg/dL (ref 0–200)
HDL: 55.5 mg/dL (ref >39.00)
LDL CALC: 116 mg/dL — AB (ref 0–99)
NonHDL: 131.5
Total CHOL/HDL Ratio: 3
Triglycerides: 78 mg/dL (ref 0.0–149.0)
VLDL: 15.6 mg/dL (ref 0.0–40.0)

## 2015-01-07 MED ORDER — CIPROFLOXACIN HCL 500 MG PO TABS: 500.0000 mg | ORAL_TABLET | Freq: Two times a day (BID) | ORAL | Status: DC

## 2015-01-07 MED ORDER — OXYCODONE-ACETAMINOPHEN 5-325 MG PO TABS
1.0000 | ORAL_TABLET | Freq: Four times a day (QID) | ORAL | Status: DC | PRN
Start: 2015-01-07 — End: 2015-01-22

## 2015-01-07 MED ORDER — METRONIDAZOLE 500 MG PO TABS: 500.0000 mg | ORAL_TABLET | Freq: Three times a day (TID) | ORAL | Status: DC

## 2015-01-07 NOTE — Progress Notes (Signed)
Pre visit review using our clinic review tool, if applicable. No additional management support is needed unless otherwise documented below in the visit note. 

## 2015-01-07 NOTE — Progress Notes (Signed)
Office Note 01/07/2015  CC:  Chief Complaint  Patient presents with  . Establish Care    HPI:  Kevin Young is a 59 y.o. White male who is transfer care from Dr. Sherren Mocha at [facility] (retiring). Old records in EPIC/[facility] EMR were reviewed prior to or during today's visit.  Recently had diverticulitis, had to go to ED and CT scan confirmed dx, took 2 weeks of abx, still having some abdominal pain.  He feels about 60% improved.  Says he still feels left sided/LLQ abd pain.  He was rx'd only 5d of cipro and 7d of metronid by the EDP. Has GI MD, has f/u appt in 2 days.  No fevers.  No n/v.  His stools are ribbon-like in consistency, no blood or pus. Having bm 2-3 times per day, usual # is 2 per day.   Lots of bloating the last month or so, esp after eating, but this is resolving some lately.  He is eating a regular diet now, trying to increase his fiber.  Pt also carries dx of IBS, diarrhea predominant.  Has hyperlipidemia, has been on atorv 20mg  qd for years, no side effects.  Lots of stress in the last year, says his wife had a heart transplant.    Past Medical History  Diagnosis Date  . Hyperlipidemia   . IBS (irritable bowel syndrome)   . Allergic rhinitis   . OSA (obstructive sleep apnea)   . Diverticulitis 12/2014  . Anxiety and depression   . Obesity, Class II, BMI 35-39.9   . Hepatic steatosis   . Nephrolithiasis 12/23/14    4mm nonobstructive R renal calc on CT abd/pelv done for diverticulitis    Past Surgical History  Procedure Laterality Date  . Tonsillectomy      Family History  Problem Relation Age of Onset  . GI problems Mother   . Cancer Father     skin  . Hypertension Father   . Glaucoma Father   . Migraines Sister   . Seizures Brother   . Depression Brother     History   Social History  . Marital Status: Married    Spouse Name: N/A  . Number of Children: N/A  . Years of Education: N/A   Occupational History  . truck driver     Social History Main Topics  . Smoking status: Former Smoker    Types: Cigars    Quit date: 12/02/2009  . Smokeless tobacco: Former Systems developer    Types: Watson date: 08/23/2013  . Alcohol Use: 3.0 oz/week    5 Cans of beer per week     Comment: 7-10 a week  . Drug Use: No  . Sexual Activity: Not on file   Other Topics Concern  . Not on file   Social History Narrative   Married, step children.   Orig from [facility]   Occupation: delivery driver.   Former Facilities manager tob user, quit 2010.   Alcohol: 1 beer a day.   MEDS: not taking the percocet listed below Outpatient Encounter Prescriptions as of 01/07/2015  Medication Sig  . aspirin 81 MG tablet Take 81 mg by mouth daily.    [name] atorvastatin (LIPITOR) 20 MG tablet TAKE 1/2 TABLET BY MOUTH EVERY DAY  . Biotin 10 MG CAPS Take 1 capsule by mouth daily.  . fluticasone (FLONASE) 50 MCG/ACT nasal spray Place 2 sprays into both nostrils daily.  . Melatonin 5 MG CAPS Take 2 capsules by  mouth at bedtime.  . montelukast (SINGULAIR) 10 MG tablet Take 1 tablet (10 mg total) by mouth at bedtime.  . naproxen sodium (ANAPROX) 550 MG tablet Take 550 mg by mouth 2 (two) times daily with a meal.  . Omega-3 Fatty Acids (FISH OIL) 1200 MG CAPS Take 1,200 mg by mouth 2 (two) times daily.  . predniSONE (DELTASONE) 20 MG tablet Take as directed with a meal. (Patient taking differently: 1 tab po qd with a meal "prn severe allergic rhin/sinusitis"-per Dr. Sherren Mocha)  . ciprofloxacin (CIPRO) 500 MG tablet Take 1 tablet (500 mg total) by mouth every 12 (twelve) hours.  . metroNIDAZOLE (FLAGYL) 500 MG tablet Take 1 tablet (500 mg total) by mouth 3 (three) times daily.  [name] oxyCODONE-acetaminophen (PERCOCET/ROXICET) 5-325 MG per tablet Take 1-2 tablets by mouth every 6 (six) hours as needed for severe pain.  . [DISCONTINUED] ciprofloxacin (CIPRO) 500 MG tablet Take 1 tablet (500 mg total) by mouth every 12 (twelve) hours. (Patient not taking: Reported on  01/07/2015)  . [DISCONTINUED] clidinium-chlordiazePOXIDE (LIBRAX) 5-2.5 MG per capsule TAKE 2 CAPSULES BY MOUTH 2 TIMES A DAY (Patient not taking: Reported on 12/23/2014)  . [DISCONTINUED] doxycycline (VIBRA-TABS) 100 MG tablet Take 1 tablet (100 mg total) by mouth 2 (two) times daily. (Patient not taking: Reported on 12/23/2014)  . [DISCONTINUED] metroNIDAZOLE (FLAGYL) 500 MG tablet Take 1 tablet (500 mg total) by mouth 3 (three) times daily. (Patient not taking: Reported on 01/07/2015)  . [DISCONTINUED] ondansetron (ZOFRAN) 4 MG tablet Take 1 tablet (4 mg total) by mouth every 6 (six) hours. (Patient not taking: Reported on 01/07/2015)  . [DISCONTINUED] oxyCODONE-acetaminophen (PERCOCET/ROXICET) 5-325 MG per tablet Take 1-2 tablets by mouth every 4 (four) hours as needed for severe pain. (Patient not taking: Reported on 01/07/2015)  . [DISCONTINUED] RA KRILL OIL 500 MG CAPS Take 1 capsule by mouth daily.    No Known Allergies  ROS Review of Systems  Constitutional: Negative for fever and fatigue.  HENT: Negative for congestion and sore throat.   Eyes: Negative for visual disturbance.  Respiratory: Negative for cough.   Cardiovascular: Negative for chest pain.  Gastrointestinal: Positive for abdominal pain. Negative for nausea, blood in stool and rectal pain.  Genitourinary: Negative for dysuria.  Musculoskeletal: Negative for back pain and joint swelling.  Skin: Negative for rash.  Neurological: Negative for weakness and headaches.  Hematological: Negative for adenopathy.    PE; Blood pressure 102/82, pulse 62, temperature 97.9 F (36.6 C), temperature source Oral, height 5' 5.5" (1.664 m), weight 228 lb (103.42 kg), SpO2 96 %. Gen: Alert, well appearing.  Patient is oriented to person, place, time, and situation. HOZ:YYQM: no injection, icteris, swelling, or exudate.  EOMI, PERRLA. Mouth: lips without lesion/swelling.  Oral mucosa pink and moist. Oropharynx without erythema, exudate, or  swelling.  Neck - No masses or thyromegaly or limitation in range of motion CV: RRR, no m/r/g.   LUNGS: CTA bilat, nonlabored resps, good aeration in all lung fields. ABD: soft, nondistended but rotund/obese.  BS slightly hypoactive.  Mild/mod TTPin lower abd from midline into LLQ, without guarding or rebound.  No bruit or mass.  No HSM.  Pertinent labs:  Lab Results  Component Value Date   TSH 1.21 01/16/2014   Lab Results  Component Value Date   WBC 14.2* 12/23/2014   HGB 14.7 12/23/2014   HCT 42.8 12/23/2014   MCV 87.3 12/23/2014   PLT 168 12/23/2014   Lab Results  Component Value Date  CREATININE 0.95 12/23/2014   BUN 17 12/23/2014   NA 138 12/23/2014   K 3.6 12/23/2014   CL 103 12/23/2014   CO2 28 12/23/2014   Lab Results  Component Value Date   ALT 33 12/23/2014   AST 18 12/23/2014   ALKPHOS 50 12/23/2014   BILITOT 0.8 12/23/2014   Lab Results  Component Value Date   CHOL 170 01/16/2014   Lab Results  Component Value Date   HDL 56.10 01/16/2014   Lab Results  Component Value Date   LDLCALC 103* 01/16/2014   Lab Results  Component Value Date   TRIG 56.0 01/16/2014   Lab Results  Component Value Date   CHOLHDL 3 01/16/2014   Lab Results  Component Value Date   PSA 0.58 01/16/2014   PSA 0.54 12/04/2012   PSA 0.53 12/02/2011    ASSESSMENT AND PLAN:   1) Acute diverticulitis, only partially treated.  I rx'd another 7d supply of both his flagyl and his cipro. Percocet 5/325, 1-2 q6h prn, #30 rx handed to pt today as well. Keep upcoming f/u appt with GI MD, [name].  Will recheck CBC today to f/u leukocytosis noted on his CBC in ED 12/23/14, which was likely from acute diverticulitis.  2) Hyperlipidemia: due for FLP, drawn today. Recent AST/ALT normal.  Tolerating statin well.  3) "prostate issues": did not get to discuss today, but he'll make appt in 2 wks to return to discuss this.  Return in about 2 weeks (around 01/21/2015) for 30 min  appt-prostate complaints + f/u GI probs.

## 2015-01-22 ENCOUNTER — Ambulatory Visit (INDEPENDENT_AMBULATORY_CARE_PROVIDER_SITE_OTHER): Payer: No Typology Code available for payment source | Admitting: Family Medicine

## 2015-01-22 ENCOUNTER — Encounter: Payer: Self-pay | Admitting: Family Medicine

## 2015-01-22 VITALS — BP 119/79 | HR 71 | Temp 98.6°F | Resp 18 | Ht 65.5 in | Wt 229.0 lb

## 2015-01-22 DIAGNOSIS — K5792 Diverticulitis of intestine, part unspecified, without perforation or abscess without bleeding: Secondary | ICD-10-CM | POA: Diagnosis not present

## 2015-01-22 DIAGNOSIS — N401 Enlarged prostate with lower urinary tract symptoms: Secondary | ICD-10-CM

## 2015-01-22 DIAGNOSIS — M25552 Pain in left hip: Secondary | ICD-10-CM

## 2015-01-22 DIAGNOSIS — J301 Allergic rhinitis due to pollen: Secondary | ICD-10-CM | POA: Diagnosis not present

## 2015-01-22 DIAGNOSIS — M5410 Radiculopathy, site unspecified: Secondary | ICD-10-CM

## 2015-01-22 DIAGNOSIS — N138 Other obstructive and reflux uropathy: Secondary | ICD-10-CM

## 2015-01-22 MED ORDER — ATORVASTATIN CALCIUM 20 MG PO TABS: 10.0000 mg | ORAL_TABLET | Freq: Every day | ORAL | Status: DC

## 2015-01-22 MED ORDER — MONTELUKAST SODIUM 10 MG PO TABS: 10.0000 mg | ORAL_TABLET | Freq: Every day | ORAL | Status: DC

## 2015-01-22 MED ORDER — TAMSULOSIN HCL 0.4 MG PO CAPS: ORAL_CAPSULE | ORAL | Status: DC

## 2015-01-22 MED ORDER — OXYCODONE-ACETAMINOPHEN 5-325 MG PO TABS: 1.0000 | ORAL_TABLET | Freq: Four times a day (QID) | ORAL | Status: DC | PRN

## 2015-01-22 MED ORDER — FLUTICASONE PROPIONATE 50 MCG/ACT NA SUSP: 2.0000 | Freq: Every day | NASAL | Status: DC

## 2015-01-22 NOTE — Progress Notes (Signed)
Pre visit review using our clinic review tool, if applicable. No additional management support is needed unless otherwise documented below in the visit note. 

## 2015-01-22 NOTE — Progress Notes (Signed)
OFFICE VISIT  01/22/2015   CC:  Chief Complaint  Patient presents with  . Follow-up     HPI:    Patient is a 59 y.o. Caucasian male who presents for 2 week f/u diverticulitis, also to discuss urinary complaints and L hip complaints.  GI: says he feels much improved s/p cipro/flagyl for divertic; saw [name] since I last saw him, was told to take probiotic qd and he has been doing this for the last 1 week.  Stools continue to gradually firm-up and LLQ pain much milder/less frequent.  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 No fevers.  Appetite is good.  Has f/u with [name] 02/08/15.  Urinary c/o: a coupld of years of gradually worsening problem with nocturia: 3-5 times per night, causing impaired sleep/rest, has mild amount of urinary frequency in daytime but says he has a strong urine stream and no signif hesitancy or dribbling, no feeling of incomplete emptying.  No dysuria or hematuria or urinary urgency. Seems to be getting a bit worse over the last year.  Left hip pain for > 1 yr, mild, mainly bothers him when lying on left side to sleep (sleeps on a recliner or couch every night due to marital probs forcing him out of his bed).  Complains of frequent feeling of radiation of the pain down the posterolateral aspect of his left leg and some paresthesias in L foot/toes diffusely.  No LE weakness. Only has occasional LBP.  Past Medical History  Diagnosis Date  . Hyperlipidemia   . IBS (irritable bowel syndrome)   . Allergic rhinitis   . OSA (obstructive sleep apnea)   . Diverticulitis 12/2014  . Anxiety and depression   . Obesity, Class II, BMI 35-39.9   . Hepatic steatosis   . Nephrolithiasis 12/23/14    32mm nonobstructive R renal calc on CT abd/pelv done for diverticulitis    Past Surgical History  Procedure Laterality Date  . Tonsillectomy      Outpatient Prescriptions Prior to Visit  Medication Sig Dispense Refill  .  aspirin 81 MG tablet Take 81 mg by mouth daily.      . Biotin 10 MG CAPS Take 1 capsule by mouth daily.    . Melatonin 5 MG CAPS Take 2 capsules by mouth at bedtime.    . naproxen sodium (ANAPROX) 550 MG tablet Take 550 mg by mouth 2 (two) times daily with a meal.    . Omega-3 Fatty Acids (FISH OIL) 1200 MG CAPS Take 1,200 mg by mouth 2 (two) times daily.    . predniSONE (DELTASONE) 20 MG tablet Take as directed with a meal. (Patient taking differently: 1 tab po qd with a meal "prn severe allergic rhin/sinusitis"-per Dr. Sherren Mocha) 30 tablet 0  . atorvastatin (LIPITOR) 20 MG tablet TAKE 1/2 TABLET BY MOUTH EVERY DAY    . ciprofloxacin (CIPRO) 500 MG tablet Take 1 tablet (500 mg total) by mouth every 12 (twelve) hours. 14 tablet 0  . fluticasone (FLONASE) 50 MCG/ACT nasal spray Place 2 sprays into both nostrils daily.    . montelukast (SINGULAIR) 10 MG tablet Take 1 tablet (10 mg total) by mouth at bedtime. 100 tablet 3  . oxyCODONE-acetaminophen (PERCOCET/ROXICET) 5-325 MG per tablet Take 1-2 tablets by mouth every 6 (six) hours as needed for severe pain. 30 tablet 0  . metroNIDAZOLE (FLAGYL) 500 MG tablet Take 1 tablet (500 mg total) by  mouth 3 (three) times daily. 21 tablet 0   No facility-administered medications prior to visit.    No Known Allergies  ROS As per HPI  PE: Blood pressure 119/79, pulse 71, temperature 98.6 F (37 C), temperature source Temporal, resp. rate 18, height 5' 5.5" (1.664 m), weight 229 lb (103.874 kg), SpO2 97 %. Gen: Alert, well appearing.  Patient is oriented to person, place, time, and situation. Pt deferred digital rectal exam today. Low back ROM intact, no LB tenderness.  No pain on palpation of ischial tuberosity region on L. Mild L greater troch region tenderness.  LE strength 5/5 prox/dist bilat.  Sitting SLR neg bilat. DTRs: 1+ patellar bilat, trace achilles bilat  LABS:    Chemistry      Component Value Date/Time   NA 138 12/23/2014 0109   K 3.6  12/23/2014 0109   CL 103 12/23/2014 0109   CO2 28 12/23/2014 0109   BUN 17 12/23/2014 0109   CREATININE 0.95 12/23/2014 0109      Component Value Date/Time   CALCIUM 8.6 12/23/2014 0109   ALKPHOS 50 12/23/2014 0109   AST 18 12/23/2014 0109   ALT 33 12/23/2014 0109   BILITOT 0.8 12/23/2014 0109     Lab Results  Component Value Date   WBC 7.8 01/07/2015   HGB 15.1 01/07/2015   HCT 44.6 01/07/2015   MCV 87.8 01/07/2015   PLT 311.0 01/07/2015   Lab Results  Component Value Date   CHOL 187 01/07/2015   HDL 55.50 01/07/2015   LDLCALC 116* 01/07/2015   LDLDIRECT 171.0 11/18/2006   TRIG 78.0 01/07/2015   CHOLHDL 3 01/07/2015    IMPRESSION AND PLAN:  1) BPH: start trial of flomax 0.4mg , 1-2 qhs.  Therapeutic expectations and side effect profile of medication discussed today.  Patient's questions answered.  2) Left hip pain, with L leg radiculopathy pain/paresthesias: suspect sciatica vs spinal nerve impingement syndrome.  Discussed option of PT and plain film of L spine today and he wants to just proceed with watchful waiting at this time.  3) Acute diverticulitis: resolving, just a remnant of sx's left.  Keep f/u with [name]. I did give him #15 of the percocet 5/325 today to have on hand.  An After Visit Summary was printed and given to the patient.  FOLLOW UP: Return in about 6 weeks (around 03/05/2015) for f/u BPH.

## 2015-01-23 ENCOUNTER — Encounter: Payer: Self-pay | Admitting: Family Medicine

## 2015-02-03 ENCOUNTER — Encounter: Payer: Self-pay | Admitting: Family Medicine

## 2015-02-21 ENCOUNTER — Telehealth: Payer: Self-pay | Admitting: Family Medicine

## 2015-02-21 MED ORDER — METRONIDAZOLE 500 MG PO TABS: 500.0000 mg | ORAL_TABLET | Freq: Three times a day (TID) | ORAL | Status: DC

## 2015-02-21 MED ORDER — CIPROFLOXACIN HCL 500 MG PO TABS: 500.0000 mg | ORAL_TABLET | Freq: Two times a day (BID) | ORAL | Status: DC

## 2015-02-21 MED ORDER — OXYCODONE-ACETAMINOPHEN 5-325 MG PO TABS: 1.0000 | ORAL_TABLET | Freq: Four times a day (QID) | ORAL | Status: DC | PRN

## 2015-02-21 NOTE — Telephone Encounter (Signed)
Pt advised and voiced understanding.   

## 2015-02-21 NOTE — Telephone Encounter (Signed)
OK. Cipro and flagyl sent to his pharmacy ([facility]). Oxycodone rx printed.

## 2015-02-21 NOTE — Telephone Encounter (Signed)
Pt. Is having GI issues again. His GI doc is [place] of town for 2 weeks. The GI office told [name] to call Dr. Anitra Lauth to get RX's for Cipro, Flagel and pain meds until he could get into see [name] upon his return.Pt. Is concerned his condition will get bad if he does not treat soon.

## 2015-03-06 ENCOUNTER — Ambulatory Visit: Admitting: Family Medicine

## 2015-03-13 ENCOUNTER — Encounter: Payer: Self-pay | Admitting: Family Medicine

## 2015-03-13 ENCOUNTER — Ambulatory Visit (INDEPENDENT_AMBULATORY_CARE_PROVIDER_SITE_OTHER): Payer: No Typology Code available for payment source | Admitting: Family Medicine

## 2015-03-13 VITALS — BP 105/69 | HR 67 | Temp 98.0°F | Resp 16 | Wt 230.0 lb

## 2015-03-13 DIAGNOSIS — M5416 Radiculopathy, lumbar region: Secondary | ICD-10-CM | POA: Diagnosis not present

## 2015-03-13 DIAGNOSIS — Z125 Encounter for screening for malignant neoplasm of prostate: Secondary | ICD-10-CM | POA: Diagnosis not present

## 2015-03-13 DIAGNOSIS — K5730 Diverticulosis of large intestine without perforation or abscess without bleeding: Secondary | ICD-10-CM | POA: Diagnosis not present

## 2015-03-13 DIAGNOSIS — N400 Enlarged prostate without lower urinary tract symptoms: Secondary | ICD-10-CM

## 2015-03-13 NOTE — Progress Notes (Signed)
OFFICE NOTE  03/13/2015  CC:  Chief Complaint  Patient presents with  . Follow-up    6 week f/u. Pt is not fasting.   HPI: Patient is a 59 y.o. Caucasian male who is here for 6 week f/u urinary complaints that I dx'd as BPH, started him on flomax last visit.  Nocturia went from 4-5 per night down to 2 per night.  He is satisfied with this result (on 0.8 mg qhs).  He is overdue for annual prostate cancer screening.  Has f/u with [name] in 1 week, reports ongoing mild but bothersome L sided groin and abd discomfort.  Relatively recent round of cipro/flagyl helped some but pt concerned about chronicity of sx's.   Also with ongoing L hip and L LB pain that radiates down L leg into foot + tingling into foot intermittently.  He declines PT trial at this time.  Pertinent PMH:  Past medical, surgical, social, and family history reviewed and no changes are noted since last office visit.  MEDS:  Outpatient Prescriptions Prior to Visit  Medication Sig Dispense Refill  . aspirin 81 MG tablet Take 81 mg by mouth daily.      [name] atorvastatin (LIPITOR) 20 MG tablet Take 0.5 tablets (10 mg total) by mouth daily. 45 tablet 3  . Biotin 10 MG CAPS Take 1 capsule by mouth daily.    . fluticasone (FLONASE) 50 MCG/ACT nasal spray Place 2 sprays into both nostrils daily. 48 g 3  . Melatonin 5 MG CAPS Take 2 capsules by mouth at bedtime.    . montelukast (SINGULAIR) 10 MG tablet Take 1 tablet (10 mg total) by mouth at bedtime. 90 tablet 3  . naproxen sodium (ANAPROX) 550 MG tablet Take 550 mg by mouth 2 (two) times daily with a meal.    . Omega-3 Fatty Acids (FISH OIL) 1200 MG CAPS Take 1,200 mg by mouth 2 (two) times daily.    [name] oxyCODONE-acetaminophen (PERCOCET/ROXICET) 5-325 MG per tablet Take 1-2 tablets by mouth every 6 (six) hours as needed for severe pain. 30 tablet 0  . predniSONE (DELTASONE) 20 MG tablet Take as directed with a meal. (Patient taking differently: 1 tab po qd with a meal "prn severe  allergic rhin/sinusitis"-per Dr. Sherren Mocha) 30 tablet 0  . tamsulosin (FLOMAX) 0.4 MG CAPS capsule 1-2 tabs po qhs 60 capsule 6  . ciprofloxacin (CIPRO) 500 MG tablet Take 1 tablet (500 mg total) by mouth 2 (two) times daily. (Patient not taking: Reported on 03/13/2015) 28 tablet 0  . metroNIDAZOLE (FLAGYL) 500 MG tablet Take 1 tablet (500 mg total) by mouth 3 (three) times daily. (Patient not taking: Reported on 03/13/2015) 42 tablet 0   No facility-administered medications prior to visit.    PE: Blood pressure 105/69, pulse 67, temperature 98 F (36.7 C), temperature source Oral, resp. rate 16, weight 230 lb (104.327 kg), SpO2 94 %. Gen: Alert, well appearing.  Patient is oriented to person, place, time, and situation. Rectal exam: negative without mass, lesions or tenderness, PROSTATE EXAM: smooth and symmetric without nodules or tenderness.   Lab Results  Component Value Date   PSA 0.58 01/16/2014   PSA 0.54 12/04/2012   PSA 0.53 12/02/2011   IMPRESSION AND PLAN:  1) BPH w/nocturia as primary symptom: significantly improved on flomax 0.8mg  qhs. Continue this.  2) Prostate cancer screening: DRE normal today.  PSA drawn today.  3) Hx of diverticulitis; smoldering-type symptoms.  F/u with [name] as planned.  4)  Lumbar pain with L radiculopathy sx's.  Not much change with NSAID use. I recommended he d/c NSAIDs unless he gets a significant "flare-up" of this.  He declined my offer/recommendation of PT at this time.  Watchful waiting approach at this time.  An After Visit Summary was printed and given to the patient.  FOLLOW UP: 6 mo

## 2015-03-13 NOTE — Progress Notes (Signed)
Pre visit review using our clinic review tool, if applicable. No additional management support is needed unless otherwise documented below in the visit note. 

## 2015-03-14 LAB — PSA: PSA: 0.49 ng/mL (ref 0.10–4.00)

## 2015-03-30 ENCOUNTER — Encounter: Payer: Self-pay | Admitting: Family Medicine

## 2015-04-25 ENCOUNTER — Telehealth: Payer: Self-pay | Admitting: Family Medicine

## 2015-04-25 MED ORDER — OXYCODONE-ACETAMINOPHEN 5-325 MG PO TABS: 1.0000 | ORAL_TABLET | Freq: Four times a day (QID) | ORAL | Status: DC | PRN

## 2015-04-25 NOTE — Telephone Encounter (Signed)
Pt is having a flare of his diverticulitis. His GI doc called in Flagel and Cipro. They told him he needed to call his PCP to get pain meds. He would like hydrocodone to get through the [place].

## 2015-04-25 NOTE — Telephone Encounter (Signed)
Agreed, rx signed.

## 2015-04-25 NOTE — Telephone Encounter (Signed)
Per Dr. Anitra Lauth okay to print Rx on medication list (oxycodone) for #30 w/ 0Rf. Rx printed and signed by provider. Pt advised Rx ready for p/u.

## 2015-05-07 ENCOUNTER — Other Ambulatory Visit: Payer: Self-pay | Admitting: Gastroenterology

## 2015-05-07 ENCOUNTER — Encounter: Payer: Self-pay | Admitting: Family Medicine

## 2015-05-07 DIAGNOSIS — K5730 Diverticulosis of large intestine without perforation or abscess without bleeding: Secondary | ICD-10-CM

## 2015-05-07 DIAGNOSIS — R109 Unspecified abdominal pain: Secondary | ICD-10-CM

## 2015-05-12 ENCOUNTER — Ambulatory Visit
Admission: RE | Admit: 2015-05-12 | Discharge: 2015-05-12 | Disposition: A | Discharge status: Home / Self Care | Source: Ambulatory Visit | Attending: Gastroenterology | Admitting: Gastroenterology

## 2015-05-12 DIAGNOSIS — R109 Unspecified abdominal pain: Secondary | ICD-10-CM

## 2015-05-12 DIAGNOSIS — K5730 Diverticulosis of large intestine without perforation or abscess without bleeding: Secondary | ICD-10-CM

## 2015-05-12 MED ORDER — IOPAMIDOL (ISOVUE-300) INJECTION 61%
125.0000 mL | Freq: Once | INTRAVENOUS | Status: AC | PRN
  Administered 2015-05-12: 125 mL via INTRAVENOUS

## 2015-06-16 ENCOUNTER — Other Ambulatory Visit: Payer: Self-pay | Admitting: *Deleted

## 2015-06-16 ENCOUNTER — Other Ambulatory Visit: Payer: Self-pay | Admitting: Family Medicine

## 2015-06-16 ENCOUNTER — Telehealth: Payer: Self-pay | Admitting: Family Medicine

## 2015-06-16 MED ORDER — OXYCODONE-ACETAMINOPHEN 5-325 MG PO TABS: 1.0000 | ORAL_TABLET | Freq: Four times a day (QID) | ORAL | Status: DC | PRN

## 2015-06-16 MED ORDER — DOXYCYCLINE HYCLATE 100 MG PO TABS: 100.0000 mg | ORAL_TABLET | Freq: Two times a day (BID) | ORAL | Status: DC

## 2015-06-16 NOTE — Telephone Encounter (Signed)
Pt LMOM on 06/16/15 at 11:59am.  He was requesting Rx for Doxycycline 100mg  BID x 10 days. He stated that his GI doctor prescribes this for him but it is unable to get in touch with then and was told before to call his PCP if he can not get in tough with them. He stated that he has episodes of diverticulitis. He stated that he has left groin/LLQ pain x 3 days no fever, n/v, or diarrhea. He stated that he also needs a refill for his oxycodone/APAP. Please advise. Thanks.   RF request for oxycodone LOV: 03/13/15 Next ov: 09/12/15 Last written: 04/25/15 #30 w/ 0RF

## 2015-06-16 NOTE — Telephone Encounter (Signed)
Pls notify pt that I rx'd the doxycycline that he requested and printed rx for the oxycodone he requested.  -thx

## 2015-06-17 NOTE — Telephone Encounter (Signed)
Pt advised and voiced understanding.   

## 2015-06-29 ENCOUNTER — Encounter: Payer: Self-pay | Admitting: Family Medicine

## 2015-07-15 ENCOUNTER — Telehealth: Payer: Self-pay | Admitting: Family Medicine

## 2015-07-15 NOTE — Telephone Encounter (Signed)
Please advise. Thanks.  

## 2015-07-15 NOTE — Telephone Encounter (Signed)
Patient has had poison ivy for 2 weeks. Does Dr. Anitra Lauth have any suggestions?

## 2015-07-15 NOTE — Telephone Encounter (Signed)
Pt advised and voiced understanding.  Apt made for tomorrow (07/16/15) at 1:00pm.

## 2015-07-15 NOTE — Telephone Encounter (Signed)
Sorry, o/v required in order to assess and make recommendations. He should have at least tried OTC hydrocortisone ointment already-thx

## 2015-07-16 ENCOUNTER — Encounter: Payer: Self-pay | Admitting: Family Medicine

## 2015-07-16 ENCOUNTER — Ambulatory Visit (INDEPENDENT_AMBULATORY_CARE_PROVIDER_SITE_OTHER): Payer: No Typology Code available for payment source | Admitting: Family Medicine

## 2015-07-16 VITALS — BP 125/85 | HR 82 | Temp 98.0°F | Resp 16 | Ht 65.5 in | Wt 253.0 lb

## 2015-07-16 DIAGNOSIS — L259 Unspecified contact dermatitis, unspecified cause: Secondary | ICD-10-CM | POA: Diagnosis not present

## 2015-07-16 DIAGNOSIS — Z23 Encounter for immunization: Secondary | ICD-10-CM | POA: Diagnosis not present

## 2015-07-16 MED ORDER — FLUTICASONE PROPIONATE 0.05 % EX CREA: TOPICAL_CREAM | Freq: Two times a day (BID) | CUTANEOUS | Status: DC

## 2015-07-16 NOTE — Progress Notes (Signed)
Pre visit review using our clinic review tool, if applicable. No additional management support is needed unless otherwise documented below in the visit note. 

## 2015-07-16 NOTE — Addendum Note (Signed)
Addended by: Lanae Crumbly on: 07/16/2015 01:27 PM   Modules accepted: Orders

## 2015-07-16 NOTE — Progress Notes (Signed)
OFFICE NOTE  07/16/2015  CC:  Chief Complaint  Patient presents with  . Rash    x 2 weeks     HPI: Patient is a 59 y.o. Caucasian male who is here for rash. About 2 wks hx of itchy/burning rash after mowing yard 2 wks ago and coming in contact with poison ivy. Affecting mainly area behind L knee and down calf some, also a bit on L ankle and a few spots on R calf.   Pertinent PMH:  Past medical, surgical, social, and family history reviewed and no changes are noted since last office visit.  MEDS:  Outpatient Prescriptions Prior to Visit  Medication Sig Dispense Refill  . aspirin 81 MG tablet Take 81 mg by mouth daily.      [name] atorvastatin (LIPITOR) 20 MG tablet Take 0.5 tablets (10 mg total) by mouth daily. 45 tablet 3  . fluticasone (FLONASE) 50 MCG/ACT nasal spray Place 2 sprays into both nostrils daily. 48 g 3  . Melatonin 5 MG CAPS Take 2 capsules by mouth at bedtime.    . montelukast (SINGULAIR) 10 MG tablet Take 1 tablet (10 mg total) by mouth at bedtime. 90 tablet 3  . naproxen sodium (ANAPROX) 550 MG tablet Take 550 mg by mouth 2 (two) times daily with a meal.    . Omega-3 Fatty Acids (FISH OIL) 1200 MG CAPS Take 1,200 mg by mouth 2 (two) times daily.    [name] oxyCODONE-acetaminophen (PERCOCET/ROXICET) 5-325 MG per tablet Take 1-2 tablets by mouth every 6 (six) hours as needed for severe pain. 30 tablet 0  . predniSONE (DELTASONE) 20 MG tablet Take as directed with a meal. (Patient taking differently: 1 tab po qd with a meal "prn severe allergic rhin/sinusitis"-per Dr. Sherren Mocha) 30 tablet 0  . tamsulosin (FLOMAX) 0.4 MG CAPS capsule 1-2 tabs po qhs 60 capsule 6  . Biotin 10 MG CAPS Take 1 capsule by mouth daily.    [name] doxycycline (VIBRA-TABS) 100 MG tablet Take 1 tablet (100 mg total) by mouth 2 (two) times daily. (Patient not taking: Reported on 07/16/2015) 20 tablet 0   No facility-administered medications prior to visit.    PE: Blood pressure 125/85, pulse 82, temperature 98  F (36.7 C), temperature source Oral, resp. rate 16, height 5' 5.5" (1.664 m), weight 253 lb (114.76 kg), SpO2 93 %. Gen: Alert, well appearing.  Patient is oriented to person, place, time, and situation. SKIN: from popliteal fossa on L down to mid point of L calf there is a slightly flaky papular rash, pinkish in color.  A few spots of similar rash is present on L ankle and R medial calf.  No vesicles, no pustules, no streaking or erythema or tenderness.  IMPRESSION AND PLAN:  Contact dermatitis:  Cutivate 0.05% generic cream rx'd to apply to affected areas bid prn. Recommended OTC nonsedating antihistamine prn for itching.  An After Visit Summary was printed and given to the patient.  FOLLOW UP: prn

## 2015-08-01 ENCOUNTER — Other Ambulatory Visit: Payer: Self-pay | Admitting: Family Medicine

## 2015-08-06 ENCOUNTER — Ambulatory Visit: Payer: Self-pay | Admitting: General Surgery

## 2015-08-20 ENCOUNTER — Other Ambulatory Visit: Payer: Self-pay | Admitting: *Deleted

## 2015-08-20 MED ORDER — TAMSULOSIN HCL 0.4 MG PO CAPS: ORAL_CAPSULE | ORAL | Status: DC

## 2015-08-20 NOTE — Telephone Encounter (Signed)
John from [facility] called requesting refill. Rx sent.   RF request for flomax LOV: 03/13/15 Next ov: 09/12/15 Last written: 01/22/15 #60 w/ 6RF

## 2015-09-05 ENCOUNTER — Encounter (HOSPITAL_COMMUNITY)
Admission: RE | Admit: 2015-09-05 | Discharge: 2015-09-05 | Disposition: A | Discharge status: Home / Self Care | Payer: Managed Care, Other (non HMO) | Source: Ambulatory Visit | Attending: General Surgery | Admitting: General Surgery

## 2015-09-05 ENCOUNTER — Encounter (HOSPITAL_COMMUNITY): Payer: Self-pay

## 2015-09-05 DIAGNOSIS — Z01812 Encounter for preprocedural laboratory examination: Secondary | ICD-10-CM | POA: Diagnosis present

## 2015-09-05 DIAGNOSIS — Z0183 Encounter for blood typing: Secondary | ICD-10-CM | POA: Diagnosis not present

## 2015-09-05 DIAGNOSIS — K5792 Diverticulitis of intestine, part unspecified, without perforation or abscess without bleeding: Secondary | ICD-10-CM | POA: Insufficient documentation

## 2015-09-05 HISTORY — DX: Unspecified malignant neoplasm of skin, unspecified: C44.90

## 2015-09-05 LAB — CBC
HCT: 43.2 % (ref 39.0–52.0)
Hemoglobin: 14.7 g/dL (ref 13.0–17.0)
MCH: 30.5 pg (ref 26.0–34.0)
MCHC: 34 g/dL (ref 30.0–36.0)
MCV: 89.6 fL (ref 78.0–100.0)
PLATELETS: 180 10*3/uL (ref 150–400)
RBC: 4.82 MIL/uL (ref 4.22–5.81)
RDW: 13.4 % (ref 11.5–15.5)
WBC: 7.1 10*3/uL (ref 4.0–10.5)

## 2015-09-05 LAB — TYPE AND SCREEN
ABO/RH(D): A POS
Antibody Screen: NEGATIVE

## 2015-09-05 LAB — BASIC METABOLIC PANEL
ANION GAP: 7 (ref 5–15)
BUN: 15 mg/dL (ref 6–20)
CALCIUM: 9.3 mg/dL (ref 8.9–10.3)
CO2: 26 mmol/L (ref 22–32)
Chloride: 106 mmol/L (ref 101–111)
Creatinine, Ser: 1.01 mg/dL (ref 0.61–1.24)
GFR calc Af Amer: >60 mL/min (ref >60)
GLUCOSE: 104 mg/dL — AB (ref 65–99)
Potassium: 4.1 mmol/L (ref 3.5–5.1)
SODIUM: 139 mmol/L (ref 135–145)

## 2015-09-05 LAB — ABO/RH: ABO/RH(D): A POS

## 2015-09-05 NOTE — Pre-Procedure Instructions (Signed)
HARTMAN STILLSON  09/05/2015     Your procedure is scheduled on : Monday September 15, 2015 at 9:00 Am.  Report to [facility] Admitting at 7: 00 AM.  Call this number if you have problems the morning of surgery: [phone]    Remember:  Do not eat food or drink liquids after midnight.  Take these medicines the morning of surgery with A SIP OF WATER : Flonase nasal spray, Oxycodone if needed   Stop taking any vitamins, herbal medications, Fish Oil, Naproxen/Aleve, Ibuprofen, Advil, Motrin, etc on Monday December 5th   Do not wear jewelry.  Do not wear lotions, powders, or cologne.    Men may shave face and neck.  Do not bring valuables to the hospital.  [facility] is not responsible for any belongings or valuables.  Contacts, dentures or bridgework may not be worn into surgery.  Leave your suitcase in the car.  After surgery it may be brought to your room.  For patients admitted to the hospital, discharge time will be determined by your treatment team.  Patients discharged the day of surgery will not be allowed to drive home.   Name and phone number of your driver:    Special instructions:  Shower using CHG soap the night before and the morning of your surgery  Please read over the following fact sheets that you were given. Pain Booklet, Coughing and Deep Breathing, Blood Transfusion Information and Surgical Site Infection Prevention

## 2015-09-05 NOTE — Progress Notes (Signed)
PCP Shawnie Dapper  Patient denied having any cardiac or pulmonary issues  Nurse instructed patient to follow bowel prep instructions from [name]. Patient stated he had not received any instructions yet, but he knows from prior procedures that he has to complete a bowel prep.

## 2015-09-12 ENCOUNTER — Encounter: Payer: Self-pay | Admitting: Family Medicine

## 2015-09-12 ENCOUNTER — Ambulatory Visit (INDEPENDENT_AMBULATORY_CARE_PROVIDER_SITE_OTHER): Payer: Managed Care, Other (non HMO) | Admitting: Family Medicine

## 2015-09-12 VITALS — BP 122/72 | HR 63 | Temp 98.1°F | Resp 16 | Ht 65.5 in | Wt 249.0 lb

## 2015-09-12 DIAGNOSIS — H6983 Other specified disorders of Eustachian tube, bilateral: Secondary | ICD-10-CM | POA: Diagnosis not present

## 2015-09-12 DIAGNOSIS — J310 Chronic rhinitis: Secondary | ICD-10-CM | POA: Diagnosis not present

## 2015-09-12 DIAGNOSIS — E785 Hyperlipidemia, unspecified: Secondary | ICD-10-CM

## 2015-09-12 DIAGNOSIS — N401 Enlarged prostate with lower urinary tract symptoms: Secondary | ICD-10-CM

## 2015-09-12 DIAGNOSIS — N138 Other obstructive and reflux uropathy: Secondary | ICD-10-CM

## 2015-09-12 MED ORDER — FINASTERIDE 5 MG PO TABS: 5.0000 mg | ORAL_TABLET | Freq: Every day | ORAL | Status: DC

## 2015-09-12 NOTE — Progress Notes (Signed)
Pre visit review using our clinic review tool, if applicable. No additional management support is needed unless otherwise documented below in the visit note. 

## 2015-09-12 NOTE — Progress Notes (Signed)
OFFICE VISIT  09/12/2015   CC:  Chief Complaint  Patient presents with  . Follow-up    Pt is not fasting.    HPI:    Patient is a 59 y.o. Caucasian male who presents for 6 mo f/u hyperlipidemia, BPH, obesity. Says still up 2-4 times per night to urinate.  Daytime frequency as well but drinks coffee in daytime (not evenings).   No straining.  No urgency.  No sense of incomplete emptying.    Pressure, crackling, popping in ears more the last week or so.  Gets nasal congestion every night, predominately right side.  Has known deviated septum that limits R nasal passage flow.   Past Medical History  Diagnosis Date  . Hyperlipidemia   . IBS (irritable bowel syndrome)   . Allergic rhinitis   . OSA (obstructive sleep apnea)   . Diverticulosis of colon     'itis 12/2014.  Ongoing LLQ pain ? scar tissue? vs ongoing inflammation--GI plans on repeat CT as of 03/20/15 f/u but pt waiting for a time when it is better for him.  . Anxiety and depression   . Obesity, Class II, BMI 35-39.9   . Hepatic steatosis   . Nephrolithiasis 12/23/14    58mm nonobstructive R renal calc on CT abd/pelv done for diverticulitis  . History of adenomatous polyp of colon   . Diverticulitis large intestine 2015/16    Recurrent: GI MD [name].  Smoldering 'itis at most recent f/u CT in fall 2016, referred to gen surg ([name]), and plan is for lap partial colectomy  . Frequent urination at night     Takes Flomax  . Skin cancer     Basal cell removed from left hand    Past Surgical History  Procedure Laterality Date  . Tonsillectomy    . Colonoscopy  10/17/2002;  07/26/13    Hx of polyps.  Recall 07/2017 per [name]  . Hernia repair      multiple.  Right inguinal repair by Dr. [name] in 2004.  [name] Esophagogastroduodenoscopy      need specifics from pt    Outpatient Prescriptions Prior to Visit  Medication Sig Dispense Refill  . aspirin 81 MG tablet Take 81 mg by mouth daily.      [name]  atorvastatin (LIPITOR) 20 MG tablet Take 0.5 tablets (10 mg total) by mouth daily. 45 tablet 3  . cetirizine (ZYRTEC) 10 MG tablet Take 10 mg by mouth daily.    . fluticasone (FLONASE) 50 MCG/ACT nasal spray Place 2 sprays into both nostrils daily. 48 g 3  . Melatonin 5 MG CAPS Take 2 capsules by mouth at bedtime.    . montelukast (SINGULAIR) 10 MG tablet Take 1 tablet (10 mg total) by mouth at bedtime. 90 tablet 3  . Multiple Vitamins-Minerals (MULTIVITAMIN WITH MINERALS) tablet Take 1 tablet by mouth daily.    . naproxen sodium (ALEVE) 220 MG tablet Take 440 mg by mouth 2 (two) times daily as needed.    . Omega-3 Fatty Acids (FISH OIL) 1200 MG CAPS Take 3 capsules by mouth daily.     [name] oxyCODONE-acetaminophen (PERCOCET/ROXICET) 5-325 MG per tablet Take 1-2 tablets by mouth every 6 (six) hours as needed for severe pain. 30 tablet 0  . predniSONE (DELTASONE) 20 MG tablet Take as directed with a meal. (Patient taking differently: 1 tab po qd with a meal "prn severe allergic rhin/sinusitis"-per Dr. Sherren Mocha) 30 tablet 0  . Probiotic Product (PROBIOTIC DAILY PO)  Take 1 capsule by mouth daily.    . tamsulosin (FLOMAX) 0.4 MG CAPS capsule 1-2 tabs po qhs (Patient taking differently: Take 0.8 mg by mouth daily. 1-2 tabs po qhs) 60 capsule 6   No facility-administered medications prior to visit.    No Known Allergies  ROS As per HPI  PE: Blood pressure 122/72, pulse 63, temperature 98.1 F (36.7 C), temperature source Oral, resp. rate 16, height 5' 5.5" (1.664 m), weight 249 lb (112.946 kg), SpO2 95 %. Gen: Alert, well appearing.  Patient is oriented to person, place, time, and situation. ENT: Ears: EACs clear, normal epithelium.  TMs with good light reflex and landmarks bilaterally.  Eyes: no injection, icteris, swelling, or exudate.  EOMI, PERRLA. Nose: no drainage or turbinate edema/swelling. Mild septal deviation to the R.  No injection or focal lesion.  Mouth: lips without lesion/swelling.  Oral  mucosa pink and moist.  Dentition intact and without obvious caries or gingival swelling.  Oropharynx without erythema, exudate, or swelling.    LABS:  Lab Results  Component Value Date   TSH 1.21 01/16/2014   Lab Results  Component Value Date   WBC 7.1 09/05/2015   HGB 14.7 09/05/2015   HCT 43.2 09/05/2015   MCV 89.6 09/05/2015   PLT 180 09/05/2015   Lab Results  Component Value Date   CREATININE 1.01 09/05/2015   BUN 15 09/05/2015   NA 139 09/05/2015   K 4.1 09/05/2015   CL 106 09/05/2015   CO2 26 09/05/2015   Lab Results  Component Value Date   ALT 33 12/23/2014   AST 18 12/23/2014   ALKPHOS 50 12/23/2014   BILITOT 0.8 12/23/2014   Lab Results  Component Value Date   CHOL 187 01/07/2015   Lab Results  Component Value Date   HDL 55.50 01/07/2015   Lab Results  Component Value Date   LDLCALC 116* 01/07/2015   Lab Results  Component Value Date   TRIG 78.0 01/07/2015   Lab Results  Component Value Date   CHOLHDL 3 01/07/2015   Lab Results  Component Value Date   PSA 0.49 03/13/2015   PSA 0.58 01/16/2014   PSA 0.54 12/04/2012    IMPRESSION AND PLAN:  1) BPH with lower urinary tract obstructive sx's, primarily nocturia:  Limit caffeine in daytime as well as evening.  He was improved from flomax but some sx's remain. Add proscar 5mg  qhs.    2) Chronic rhinitis with eustachian tube dysfunction lately: no change in meds at this time. Reassured pt no sign of infectious sinusitis.  He will likely pursue ENT eval in future.  3) Hyperlipidemia: tolerating statin, lipids were fine 01/2015, AST/ALT normal 12/2014. Continue to work on wt loss.  4) Recurrent diverticulitis: he'll be getting partial colectomy next week.  An After Visit Summary was printed and given to the patient.  FOLLOW UP: Return in about 6 months (around 03/12/2016) for annual CPE (fasting).

## 2015-09-14 MED ORDER — CEFOTETAN DISODIUM-DEXTROSE 2-2.08 GM-% IV SOLR
2.0000 g | INTRAVENOUS | Status: AC
  Administered 2015-09-15: 2 g via INTRAVENOUS
  Filled 2015-09-14: qty 50

## 2015-09-14 NOTE — H&P (Signed)
History of Present Illness Kevin Neri E. Grandville Silos MD; 06/25/2015 9:37 AM) Patient words: Diverticulitis.  The patient is a 58 year old male who presents with diverticulitis. Kevin Young is well known to me status post right inguinal hernia repair in 2004. He is been having some left lower quadrant and suprapubic abdominal pain since January. It's possible he had it before and as well. The symptoms were somewhat vague. Early this year, he had a severe attack of the pain and was seen at [facility] emergency department in March of this year. CT scan showed sigmoid diverticulitis. He was treated as an outpatient with pain medication and antibiotics. Since then it is never totally gone away. He is followed closely by Dr. Watt Young at [facility] and Kevin Young asked me to see him in consultation for consideration of sigmoid colectomy. He recently had a follow-up CAT scan in August of this year which showed improved but not resolved sigmoid diverticulitis. He is taking antibiotics per Dr. Watt Young at this time. He also describes some bloating especially after eating. Bowel movements have been regular without significant changes.   Other Problems Kevin Leventhal, RN, BSN; 06/25/2015 9:08 AM) Anxiety Disorder Back Pain Hemorrhoids Hypercholesterolemia Inguinal Hernia Melanoma Other disease, cancer, significant illness  Past Surgical History Kevin Leventhal, RN, BSN; 06/25/2015 9:08 AM) Colon Polyp Removal - Colonoscopy Open Inguinal Hernia Surgery Right.  Diagnostic Studies History Kevin Leventhal, RN, BSN; 06/25/2015 9:08 AM) Colonoscopy 1-5 years ago  Allergies Kevin Leventhal, RN, BSN; 06/25/2015 9:09 AM) No Known Allergies09/21/2016  Medication History (Kevin Leventhal, RN, BSN; 06/25/2015 9:14 AM) Oxycodone-Acetaminophen (5-325MG  Tablet, Oral as needed) Active. Atorvastatin Calcium (20MG  Tablet, Oral daily) Active. Doxycycline Hyclate (100MG  Tablet, Oral daily)  Active. Fluticasone Propionate (50MCG/ACT Suspension, Nasal daily) Active. Tamsulosin HCl (0.4MG  Capsule, Oral daily) Active. Montelukast Sodium (10MG  Tablet, Oral daily) Active. Aspirin EC (81MG  Tablet DR, Oral daily) Active. Biotin (10MG  Tablet, Oral daily) Active. Melatonin (5MG  Tablet, Oral daily) Active. Multiple Vitamin (1 (one) Oral daily) Active. Probiotic Daily (Oral daily) Active. Medications Reconciled  Social History Multimedia programmer, RN, BSN; 06/25/2015 9:08 AM) Alcohol use Moderate alcohol use. Caffeine use Carbonated beverages, Coffee, Tea. No drug use Tobacco use Former smoker.  Family History (Kevin Leventhal, RN, BSN; 06/25/2015 9:08 AM) Arthritis Mother. Depression Mother, Sister. Diabetes Mellitus Mother. Hypertension Father. Ischemic Bowel Disease Mother. Melanoma Mother. Respiratory Condition Father.    Review of Systems Occupational hygienist, BSN; 06/25/2015 9:08 AM) General Present- Weight Gain. Not Present- Appetite Loss, Chills, Fatigue, Fever, Night Sweats and Weight Loss. Skin Not Present- Change in Wart/Mole, Dryness, Hives, Jaundice, New Lesions, Non-Healing Wounds, Rash and Ulcer. HEENT Present- Seasonal Allergies and Sinus Pain. Not Present- Earache, Hearing Loss, Hoarseness, Nose Bleed, Oral Ulcers, Ringing in the Ears, Sore Throat, Visual Disturbances, Wears glasses/contact lenses and Yellow Eyes. Respiratory Not Present- Bloody sputum, Chronic Cough, Difficulty Breathing, Snoring and Wheezing. Breast Not Present- Breast Mass, Breast Pain, Nipple Discharge and Skin Changes. Cardiovascular Not Present- Chest Pain, Difficulty Breathing Lying Down, Leg Cramps, Palpitations, Rapid Heart Rate, Shortness of Breath and Swelling of Extremities. Gastrointestinal Present- Abdominal Pain and Bloating. Not Present- Bloody Stool, Change in Bowel Habits, Chronic diarrhea, Constipation, Difficulty Swallowing, Excessive gas, Gets full quickly at  meals, Hemorrhoids, Indigestion, Nausea, Rectal Pain and Vomiting. Male Genitourinary Present- Frequency. Not Present- Blood in Urine, Change in Urinary Stream, Impotence, Nocturia, Painful Urination, Urgency and Urine Leakage. Musculoskeletal Not Present- Back Pain, Joint Pain, Joint Stiffness, Muscle Pain, Muscle Weakness and Swelling of Extremities. Neurological Not Present-  Decreased Memory, Fainting, Headaches, Numbness, Seizures, Tingling, Tremor, Trouble walking and Weakness. Psychiatric Not Present- Anxiety, Bipolar, Change in Sleep Pattern, Depression, Fearful and Frequent crying. Endocrine Not Present- Cold Intolerance, Excessive Hunger, Hair Changes, Heat Intolerance, Hot flashes and New Diabetes. Hematology Not Present- Easy Bruising, Excessive bleeding, Gland problems, HIV and Persistent Infections.  Vitals Occupational hygienist, BSN; 06/25/2015 9:09 AM) 06/25/2015 9:08 AM Weight: 244.2 lb Height: 66in Body Surface Area: 2.27 m Body Mass Index: 39.41 kg/m  Temp.: 98.17F(Oral)  Pulse: 80 (Regular)  BP: 124/82 (Sitting, Left Arm, Standard)     Physical Exam (Braxtin Bamba E. Grandville Silos MD; 06/25/2015 9:39 AM) General Mental Status-Alert. General Appearance-Consistent with stated age. Hydration-Well hydrated. Voice-Normal.  Head and Neck Head-normocephalic, atraumatic with no lesions or palpable masses. Trachea-midline. Thyroid Gland Characteristics - normal size and consistency.  Eye Eyeball - Bilateral-Extraocular movements intact. Sclera/Conjunctiva - Bilateral-No scleral icterus.  Chest and Lung Exam Chest and lung exam reveals -quiet, even and easy respiratory effort with no use of accessory muscles and on auscultation, normal breath sounds, no adventitious sounds and normal vocal resonance. Inspection Chest Wall - Normal. Back - normal.  Breast Breast - Left-Symmetric, Non Tender, No Biopsy scars, no Dimpling, No Inflammation, No Lumpectomy  scars, No Mastectomy scars, No Peau d' Orange. Breast - Right-Symmetric, Non Tender, No Biopsy scars, no Dimpling, No Inflammation, No Lumpectomy scars, No Mastectomy scars, No Peau d' Orange. Breast Lump-No Palpable Breast Mass.  Cardiovascular Cardiovascular examination reveals -normal heart sounds, regular rate and rhythm with no murmurs and normal pedal pulses bilaterally.  Abdomen Inspection Inspection of the abdomen reveals - No Hernias. Skin - Scar - no surgical scars. Palpation/Percussion Palpation and Percussion of the abdomen reveal - Soft, No Rebound tenderness, No Rigidity (guarding) and No hepatosplenomegaly. Note: Tenderness and mild, vague fullness left lower quadrant with no guarding, no generalized peritoneal signs. Bowel sounds are normal. Tenderness - Left Lower Quadrant. Auscultation Auscultation of the abdomen reveals - Bowel sounds normal.  Neurologic Neurologic evaluation reveals -alert and oriented x 3 with no impairment of recent or remote memory. Mental Status-Normal.  Musculoskeletal Normal Exam - Left-Upper Extremity Strength Normal and Lower Extremity Strength Normal. Normal Exam - Right-Upper Extremity Strength Normal and Lower Extremity Strength Normal.  Lymphatic Head & Neck  General Head & Neck Lymphatics: Bilateral - Description - Normal. Axillary  General Axillary Region: Bilateral - Description - Normal. Tenderness - Non Tender. Femoral & Inguinal  Generalized Femoral & Inguinal Lymphatics: Bilateral - Description - Normal. Tenderness - Non Tender.    Assessment & Plan Kevin Neri E. Grandville Silos MD; 06/25/2015 9:40 AM) DIVERTICULITIS (K57.92) Impression: Sigmoid diverticulitis has been smoldering without complete resolution. I have offered laparoscopic assisted sigmoid colectomy. Procedure, risks, and benefits were discussed in detail with him. We also discussed the bowel prep which will be necessary. He has some issues with his schedule  including both work and his wife who has undergone a heart transplant. He would like to schedule surgery in December. I gave him some educational materials including a booklet.  [name], MD, MPH, FACS Trauma: [phone] General Surgery: [phone]

## 2015-09-15 ENCOUNTER — Inpatient Hospital Stay (HOSPITAL_COMMUNITY): Payer: Managed Care, Other (non HMO) | Admitting: Anesthesiology

## 2015-09-15 ENCOUNTER — Encounter (HOSPITAL_COMMUNITY): Payer: Self-pay | Admitting: Anesthesiology

## 2015-09-15 ENCOUNTER — Encounter (HOSPITAL_COMMUNITY)
Admission: RE | Disposition: A | Discharge status: Home / Self Care | Payer: Self-pay | Source: Ambulatory Visit | Attending: General Surgery

## 2015-09-15 ENCOUNTER — Inpatient Hospital Stay (HOSPITAL_COMMUNITY)
Admission: RE | Admit: 2015-09-15 | Discharge: 2015-09-20 | DRG: 330 | Disposition: A | Discharge status: Home / Self Care | Payer: Managed Care, Other (non HMO) | Source: Ambulatory Visit | Attending: General Surgery | Admitting: General Surgery

## 2015-09-15 DIAGNOSIS — K649 Unspecified hemorrhoids: Secondary | ICD-10-CM | POA: Diagnosis present

## 2015-09-15 DIAGNOSIS — Z818 Family history of other mental and behavioral disorders: Secondary | ICD-10-CM

## 2015-09-15 DIAGNOSIS — Z8379 Family history of other diseases of the digestive system: Secondary | ICD-10-CM | POA: Diagnosis not present

## 2015-09-15 DIAGNOSIS — C439 Malignant melanoma of skin, unspecified: Secondary | ICD-10-CM | POA: Diagnosis present

## 2015-09-15 DIAGNOSIS — Z8249 Family history of ischemic heart disease and other diseases of the circulatory system: Secondary | ICD-10-CM

## 2015-09-15 DIAGNOSIS — K5732 Diverticulitis of large intestine without perforation or abscess without bleeding: Secondary | ICD-10-CM | POA: Diagnosis present

## 2015-09-15 DIAGNOSIS — Z7982 Long term (current) use of aspirin: Secondary | ICD-10-CM | POA: Diagnosis not present

## 2015-09-15 DIAGNOSIS — R509 Fever, unspecified: Secondary | ICD-10-CM | POA: Diagnosis not present

## 2015-09-15 DIAGNOSIS — N400 Enlarged prostate without lower urinary tract symptoms: Secondary | ICD-10-CM | POA: Diagnosis present

## 2015-09-15 DIAGNOSIS — G4730 Sleep apnea, unspecified: Secondary | ICD-10-CM | POA: Diagnosis present

## 2015-09-15 DIAGNOSIS — Z86010 Personal history of colonic polyps: Secondary | ICD-10-CM

## 2015-09-15 DIAGNOSIS — Z808 Family history of malignant neoplasm of other organs or systems: Secondary | ICD-10-CM | POA: Diagnosis not present

## 2015-09-15 DIAGNOSIS — F419 Anxiety disorder, unspecified: Secondary | ICD-10-CM | POA: Diagnosis present

## 2015-09-15 DIAGNOSIS — G4700 Insomnia, unspecified: Secondary | ICD-10-CM | POA: Diagnosis not present

## 2015-09-15 DIAGNOSIS — Z833 Family history of diabetes mellitus: Secondary | ICD-10-CM | POA: Diagnosis not present

## 2015-09-15 DIAGNOSIS — Z7289 Other problems related to lifestyle: Secondary | ICD-10-CM | POA: Diagnosis not present

## 2015-09-15 DIAGNOSIS — Z87891 Personal history of nicotine dependence: Secondary | ICD-10-CM

## 2015-09-15 DIAGNOSIS — E7800 Pure hypercholesterolemia, unspecified: Secondary | ICD-10-CM | POA: Diagnosis present

## 2015-09-15 DIAGNOSIS — Z6841 Body Mass Index (BMI) 40.0 and over, adult: Secondary | ICD-10-CM | POA: Diagnosis not present

## 2015-09-15 DIAGNOSIS — Z9049 Acquired absence of other specified parts of digestive tract: Secondary | ICD-10-CM

## 2015-09-15 HISTORY — PX: LAPAROSCOPIC SIGMOID COLECTOMY: SHX5928

## 2015-09-15 LAB — CBC
HCT: 40.9 % (ref 39.0–52.0)
Hemoglobin: 13.7 g/dL (ref 13.0–17.0)
MCH: 30.4 pg (ref 26.0–34.0)
MCHC: 33.5 g/dL (ref 30.0–36.0)
MCV: 90.9 fL (ref 78.0–100.0)
PLATELETS: 171 10*3/uL (ref 150–400)
RBC: 4.5 MIL/uL (ref 4.22–5.81)
RDW: 13.5 % (ref 11.5–15.5)
WBC: 12.5 10*3/uL — ABNORMAL HIGH (ref 4.0–10.5)

## 2015-09-15 LAB — CREATININE, SERUM
Creatinine, Ser: 1.06 mg/dL (ref 0.61–1.24)
GFR calc Af Amer: >60 mL/min (ref >60)
GFR calc non Af Amer: >60 mL/min (ref >60)

## 2015-09-15 SURGERY — COLECTOMY, SIGMOID, LAPAROSCOPIC
Anesthesia: General | Site: Abdomen

## 2015-09-15 MED ORDER — ONDANSETRON HCL 4 MG/2ML IJ SOLN: 4.0000 mg | Freq: Four times a day (QID) | INTRAMUSCULAR | Status: DC | PRN

## 2015-09-15 MED ORDER — ACETAMINOPHEN 325 MG PO TABS
650.0000 mg | ORAL_TABLET | Freq: Four times a day (QID) | ORAL | Status: DC | PRN
  Administered 2015-09-17 – 2015-09-18 (×2): 650 mg via ORAL
  Filled 2015-09-15 (×2): qty 2

## 2015-09-15 MED ORDER — LIDOCAINE HCL (CARDIAC) 20 MG/ML IV SOLN
INTRAVENOUS | Status: DC | PRN
  Administered 2015-09-15: 100 mg via INTRAVENOUS

## 2015-09-15 MED ORDER — MELATONIN 5 MG PO CAPS: 2.0000 | ORAL_CAPSULE | Freq: Every day | ORAL | Status: DC

## 2015-09-15 MED ORDER — 0.9 % SODIUM CHLORIDE (POUR BTL) OPTIME
TOPICAL | Status: DC | PRN
Start: 2015-09-15 — End: 2015-09-15
  Administered 2015-09-15: 1000 mL

## 2015-09-15 MED ORDER — 0.9 % SODIUM CHLORIDE (POUR BTL) OPTIME
TOPICAL | Status: DC | PRN
  Administered 2015-09-15: 1000 mL

## 2015-09-15 MED ORDER — ENOXAPARIN SODIUM 40 MG/0.4ML ~~LOC~~ SOLN
40.0000 mg | SUBCUTANEOUS | Status: DC
  Administered 2015-09-16 – 2015-09-20 (×5): 40 mg via SUBCUTANEOUS
  Filled 2015-09-15 (×5): qty 0.4

## 2015-09-15 MED ORDER — SODIUM CHLORIDE 0.9 % IR SOLN
Status: DC | PRN
  Administered 2015-09-15: 1000 mL

## 2015-09-15 MED ORDER — SUCCINYLCHOLINE CHLORIDE 20 MG/ML IJ SOLN
INTRAMUSCULAR | Status: DC | PRN
  Administered 2015-09-15: 120 mg via INTRAVENOUS

## 2015-09-15 MED ORDER — PROMETHAZINE HCL 25 MG/ML IJ SOLN
INTRAMUSCULAR | Status: AC
  Filled 2015-09-15: qty 1

## 2015-09-15 MED ORDER — PROPOFOL 10 MG/ML IV BOLUS
INTRAVENOUS | Status: DC | PRN
  Administered 2015-09-15: 200 mg via INTRAVENOUS

## 2015-09-15 MED ORDER — HYDROMORPHONE 1 MG/ML IV SOLN
INTRAVENOUS | Status: AC
  Filled 2015-09-15: qty 25

## 2015-09-15 MED ORDER — LACTATED RINGERS IV SOLN
INTRAVENOUS | Status: DC
  Administered 2015-09-15: 50 mL/h via INTRAVENOUS
  Administered 2015-09-15 (×2): via INTRAVENOUS

## 2015-09-15 MED ORDER — DIPHENHYDRAMINE HCL 12.5 MG/5ML PO ELIX: 12.5000 mg | ORAL_SOLUTION | Freq: Four times a day (QID) | ORAL | Status: DC | PRN

## 2015-09-15 MED ORDER — VECURONIUM BROMIDE 10 MG IV SOLR
INTRAVENOUS | Status: DC | PRN
  Administered 2015-09-15: 1 mg via INTRAVENOUS
  Administered 2015-09-15: 4 mg via INTRAVENOUS

## 2015-09-15 MED ORDER — ALVIMOPAN 12 MG PO CAPS
12.0000 mg | ORAL_CAPSULE | Freq: Two times a day (BID) | ORAL | Status: DC
  Administered 2015-09-16 – 2015-09-18 (×5): 12 mg via ORAL
  Filled 2015-09-15 (×5): qty 1

## 2015-09-15 MED ORDER — ALVIMOPAN 12 MG PO CAPS
12.0000 mg | ORAL_CAPSULE | Freq: Once | ORAL | Status: AC
  Administered 2015-09-15: 12 mg via ORAL

## 2015-09-15 MED ORDER — HYDROMORPHONE HCL 1 MG/ML IJ SOLN
INTRAMUSCULAR | Status: AC
  Filled 2015-09-15: qty 1

## 2015-09-15 MED ORDER — ALVIMOPAN 12 MG PO CAPS
ORAL_CAPSULE | ORAL | Status: AC
  Filled 2015-09-15: qty 1

## 2015-09-15 MED ORDER — ACETAMINOPHEN 650 MG RE SUPP: 650.0000 mg | Freq: Four times a day (QID) | RECTAL | Status: DC | PRN

## 2015-09-15 MED ORDER — PANTOPRAZOLE SODIUM 40 MG IV SOLR
40.0000 mg | Freq: Every day | INTRAVENOUS | Status: DC
  Administered 2015-09-15 – 2015-09-19 (×5): 40 mg via INTRAVENOUS
  Filled 2015-09-15 (×5): qty 40

## 2015-09-15 MED ORDER — NALOXONE HCL 0.4 MG/ML IJ SOLN: 0.4000 mg | INTRAMUSCULAR | Status: DC | PRN

## 2015-09-15 MED ORDER — MONTELUKAST SODIUM 10 MG PO TABS
10.0000 mg | ORAL_TABLET | Freq: Every day | ORAL | Status: DC
  Administered 2015-09-15 – 2015-09-19 (×5): 10 mg via ORAL
  Filled 2015-09-15 (×5): qty 1

## 2015-09-15 MED ORDER — HYDROMORPHONE HCL 1 MG/ML IJ SOLN
0.5000 mg | INTRAMUSCULAR | Status: AC | PRN
  Administered 2015-09-15 (×4): 0.5 mg via INTRAVENOUS

## 2015-09-15 MED ORDER — DEXAMETHASONE SODIUM PHOSPHATE 4 MG/ML IJ SOLN
INTRAMUSCULAR | Status: DC | PRN
  Administered 2015-09-15: 8 mg via INTRAVENOUS

## 2015-09-15 MED ORDER — ALBUMIN HUMAN 5 % IV SOLN
INTRAVENOUS | Status: DC | PRN
  Administered 2015-09-15 (×2): via INTRAVENOUS

## 2015-09-15 MED ORDER — TAMSULOSIN HCL 0.4 MG PO CAPS
0.8000 mg | ORAL_CAPSULE | Freq: Every day | ORAL | Status: DC
  Administered 2015-09-15 – 2015-09-20 (×6): 0.8 mg via ORAL
  Filled 2015-09-15 (×7): qty 2

## 2015-09-15 MED ORDER — CETYLPYRIDINIUM CHLORIDE 0.05 % MT LIQD
7.0000 mL | Freq: Two times a day (BID) | OROMUCOSAL | Status: DC
  Administered 2015-09-15 – 2015-09-18 (×4): 7 mL via OROMUCOSAL

## 2015-09-15 MED ORDER — BUPIVACAINE-EPINEPHRINE (PF) 0.25% -1:200000 IJ SOLN
INTRAMUSCULAR | Status: AC
  Filled 2015-09-15: qty 30

## 2015-09-15 MED ORDER — MIDAZOLAM HCL 5 MG/5ML IJ SOLN: INTRAMUSCULAR | Status: DC | PRN

## 2015-09-15 MED ORDER — HYDROMORPHONE HCL 1 MG/ML IJ SOLN
0.2500 mg | INTRAMUSCULAR | Status: DC | PRN
  Administered 2015-09-15 (×4): 0.5 mg via INTRAVENOUS

## 2015-09-15 MED ORDER — FENTANYL CITRATE (PF) 100 MCG/2ML IJ SOLN
INTRAMUSCULAR | Status: DC | PRN
  Administered 2015-09-15: 150 ug via INTRAVENOUS
  Administered 2015-09-15: 50 ug via INTRAVENOUS
  Administered 2015-09-15: 100 ug via INTRAVENOUS
  Administered 2015-09-15: 50 ug via INTRAVENOUS
  Administered 2015-09-15: 100 ug via INTRAVENOUS
  Administered 2015-09-15: 50 ug via INTRAVENOUS

## 2015-09-15 MED ORDER — PROMETHAZINE HCL 25 MG/ML IJ SOLN
6.2500 mg | INTRAMUSCULAR | Status: DC | PRN
  Administered 2015-09-15: 6.25 mg via INTRAVENOUS

## 2015-09-15 MED ORDER — MIDAZOLAM HCL 5 MG/5ML IJ SOLN
INTRAMUSCULAR | Status: DC | PRN
  Administered 2015-09-15: 2 mg via INTRAVENOUS

## 2015-09-15 MED ORDER — METHOCARBAMOL 500 MG PO TABS
500.0000 mg | ORAL_TABLET | Freq: Four times a day (QID) | ORAL | Status: DC | PRN
  Administered 2015-09-16 – 2015-09-20 (×7): 500 mg via ORAL
  Filled 2015-09-15 (×7): qty 1

## 2015-09-15 MED ORDER — FINASTERIDE 5 MG PO TABS
5.0000 mg | ORAL_TABLET | Freq: Every day | ORAL | Status: DC
Start: 2015-09-16 — End: 2015-09-20
  Administered 2015-09-16 – 2015-09-20 (×5): 5 mg via ORAL
  Filled 2015-09-15 (×5): qty 1

## 2015-09-15 MED ORDER — KCL IN DEXTROSE-NACL 20-5-0.45 MEQ/L-%-% IV SOLN
INTRAVENOUS | Status: DC
  Administered 2015-09-15 – 2015-09-17 (×6): via INTRAVENOUS
  Filled 2015-09-15 (×7): qty 1000

## 2015-09-15 MED ORDER — SODIUM CHLORIDE 0.9 % IV SOLN
10.0000 mg | INTRAVENOUS | Status: DC | PRN
  Administered 2015-09-15: 10 ug/min via INTRAVENOUS

## 2015-09-15 MED ORDER — HYDRALAZINE HCL 20 MG/ML IJ SOLN: 10.0000 mg | INTRAMUSCULAR | Status: DC | PRN

## 2015-09-15 MED ORDER — SODIUM CHLORIDE 0.9 % IJ SOLN: 9.0000 mL | INTRAMUSCULAR | Status: DC | PRN

## 2015-09-15 MED ORDER — CHLORHEXIDINE GLUCONATE 0.12 % MT SOLN
15.0000 mL | Freq: Two times a day (BID) | OROMUCOSAL | Status: DC
  Administered 2015-09-15 – 2015-09-18 (×7): 15 mL via OROMUCOSAL
  Filled 2015-09-15 (×6): qty 15

## 2015-09-15 MED ORDER — BUPIVACAINE-EPINEPHRINE 0.25% -1:200000 IJ SOLN
INTRAMUSCULAR | Status: DC | PRN
  Administered 2015-09-15: 9 mL

## 2015-09-15 MED ORDER — KCL IN DEXTROSE-NACL 20-5-0.45 MEQ/L-%-% IV SOLN
INTRAVENOUS | Status: AC
  Filled 2015-09-15: qty 1000

## 2015-09-15 MED ORDER — FLUTICASONE PROPIONATE 50 MCG/ACT NA SUSP
2.0000 | Freq: Every day | NASAL | Status: DC
  Administered 2015-09-17 – 2015-09-20 (×4): 2 via NASAL
  Filled 2015-09-15: qty 16

## 2015-09-15 MED ORDER — DIPHENHYDRAMINE HCL 50 MG/ML IJ SOLN: 12.5000 mg | Freq: Four times a day (QID) | INTRAMUSCULAR | Status: DC | PRN

## 2015-09-15 MED ORDER — ROCURONIUM BROMIDE 100 MG/10ML IV SOLN
INTRAVENOUS | Status: DC | PRN
  Administered 2015-09-15: 50 mg via INTRAVENOUS

## 2015-09-15 MED ORDER — SUGAMMADEX SODIUM 200 MG/2ML IV SOLN
INTRAVENOUS | Status: DC | PRN
  Administered 2015-09-15: 200 mg via INTRAVENOUS

## 2015-09-15 MED ORDER — ONDANSETRON HCL 4 MG/2ML IJ SOLN
INTRAMUSCULAR | Status: DC | PRN
  Administered 2015-09-15: 4 mg via INTRAVENOUS

## 2015-09-15 MED ORDER — ARTIFICIAL TEARS OP OINT
TOPICAL_OINTMENT | OPHTHALMIC | Status: DC | PRN
  Administered 2015-09-15: 1 via OPHTHALMIC

## 2015-09-15 MED ORDER — HYDROMORPHONE 1 MG/ML IV SOLN
INTRAVENOUS | Status: DC
  Administered 2015-09-15: 12:00:00 via INTRAVENOUS
  Administered 2015-09-15: 3.3 mg via INTRAVENOUS
  Administered 2015-09-15: 1.8 mg via INTRAVENOUS
  Administered 2015-09-16: 2.7 mg via INTRAVENOUS
  Administered 2015-09-16: 1.8 mg via INTRAVENOUS
  Administered 2015-09-16: 1.5 mg via INTRAVENOUS
  Administered 2015-09-16: 2.1 mg via INTRAVENOUS
  Administered 2015-09-16: 0.3 mg via INTRAVENOUS

## 2015-09-15 SURGICAL SUPPLY — 90 items
APPLIER CLIP 5 13 M/L LIGAMAX5 (MISCELLANEOUS)
APPLIER CLIP ROT 10 11.4 M/L (STAPLE)
APR CLP MED LRG 11.4X10 (STAPLE)
APR CLP MED LRG 5 ANG JAW (MISCELLANEOUS)
BAG DECANTER FOR FLEXI CONT (MISCELLANEOUS) IMPLANT
BLADE SURG 10 STRL SS (BLADE) ×1 IMPLANT
BLADE SURG ROTATE 9660 (MISCELLANEOUS) IMPLANT
CANISTER SUCTION 2500CC (MISCELLANEOUS) ×2 IMPLANT
CELLS DAT CNTRL 66122 CELL SVR (MISCELLANEOUS) IMPLANT
CHLORAPREP W/TINT 26ML (MISCELLANEOUS) ×2 IMPLANT
CLIP APPLIE 5 13 M/L LIGAMAX5 (MISCELLANEOUS) IMPLANT
CLIP APPLIE ROT 10 11.4 M/L (STAPLE) IMPLANT
COVER MAYO STAND STRL (DRAPES) ×4 IMPLANT
COVER SURGICAL LIGHT HANDLE (MISCELLANEOUS) ×4 IMPLANT
DRAPE PROXIMA HALF (DRAPES) ×2 IMPLANT
DRAPE UTILITY XL STRL (DRAPES) ×10 IMPLANT
DRAPE WARM FLUID 44X44 (DRAPE) ×2 IMPLANT
DRSG OPSITE POSTOP 4X10 (GAUZE/BANDAGES/DRESSINGS) ×1 IMPLANT
DRSG OPSITE POSTOP 4X8 (GAUZE/BANDAGES/DRESSINGS) IMPLANT
ELECT BLADE 4.0 EZ CLEAN MEGAD (MISCELLANEOUS) ×2
ELECT BLADE 6.5 EXT (BLADE) ×2 IMPLANT
ELECT CAUTERY BLADE 6.4 (BLADE) ×4 IMPLANT
ELECT REM PT RETURN 9FT ADLT (ELECTROSURGICAL) ×2
ELECTRODE BLDE 4.0 EZ CLN MEGD (MISCELLANEOUS) IMPLANT
ELECTRODE REM PT RTRN 9FT ADLT (ELECTROSURGICAL) ×1 IMPLANT
GAUZE SPONGE 2X2 8PLY STRL LF (GAUZE/BANDAGES/DRESSINGS) IMPLANT
GEL ULTRASOUND 20GR AQUASONIC (MISCELLANEOUS) IMPLANT
GLOVE BIO SURGEON STRL SZ7.5 (GLOVE) ×3 IMPLANT
GLOVE BIO SURGEON STRL SZ8 (GLOVE) ×4 IMPLANT
GLOVE BIOGEL PI IND STRL 7.0 (GLOVE) IMPLANT
GLOVE BIOGEL PI IND STRL 7.5 (GLOVE) IMPLANT
GLOVE BIOGEL PI IND STRL 8 (GLOVE) ×2 IMPLANT
GLOVE BIOGEL PI INDICATOR 7.0 (GLOVE) ×3
GLOVE BIOGEL PI INDICATOR 7.5 (GLOVE) ×1
GLOVE BIOGEL PI INDICATOR 8 (GLOVE) ×2
GLOVE SURG SS PI 7.0 STRL IVOR (GLOVE) ×1 IMPLANT
GOWN STRL REUS W/ TWL LRG LVL3 (GOWN DISPOSABLE) ×6 IMPLANT
GOWN STRL REUS W/ TWL XL LVL3 (GOWN DISPOSABLE) ×2 IMPLANT
GOWN STRL REUS W/TWL LRG LVL3 (GOWN DISPOSABLE) ×6
GOWN STRL REUS W/TWL XL LVL3 (GOWN DISPOSABLE) ×8
KIT BASIN OR (CUSTOM PROCEDURE TRAY) ×2 IMPLANT
LEGGING LITHOTOMY PAIR STRL (DRAPES) ×2 IMPLANT
LIGASURE IMPACT 36 18CM CVD LR (INSTRUMENTS) ×1 IMPLANT
NS IRRIG 1000ML POUR BTL (IV SOLUTION) ×4 IMPLANT
PAD ARMBOARD 7.5X6 YLW CONV (MISCELLANEOUS) ×4 IMPLANT
PENCIL BUTTON HOLSTER BLD 10FT (ELECTRODE) ×4 IMPLANT
RELOAD PROXIMATE 75MM BLUE (ENDOMECHANICALS) ×2 IMPLANT
RELOAD STAPLE 75 3.8 BLU REG (ENDOMECHANICALS) IMPLANT
RELOAD STAPLER LINEAR PROX 30 (STAPLE) ×1 IMPLANT
RETRACTOR WND ALEXIS 18 MED (MISCELLANEOUS) IMPLANT
RETRACTOR WND ALEXIS 25 LRG (MISCELLANEOUS) IMPLANT
RTRCTR WOUND ALEXIS 18CM MED (MISCELLANEOUS)
RTRCTR WOUND ALEXIS 25CM LRG (MISCELLANEOUS) ×2
SCALPEL HARMONIC ACE (MISCELLANEOUS) ×2 IMPLANT
SCISSORS LAP 5X35 DISP (ENDOMECHANICALS) ×2 IMPLANT
SET IRRIG TUBING LAPAROSCOPIC (IRRIGATION / IRRIGATOR) ×1 IMPLANT
SLEEVE ENDOPATH XCEL 5M (ENDOMECHANICALS) ×4 IMPLANT
SPECIMEN JAR LARGE (MISCELLANEOUS) ×2 IMPLANT
SPONGE GAUZE 2X2 STER 10/PKG (GAUZE/BANDAGES/DRESSINGS) ×1
STAPLER CUT CVD 40MM BLUE (STAPLE) ×1 IMPLANT
STAPLER PROXIMATE 75MM BLUE (STAPLE) ×1 IMPLANT
STAPLER RELOAD LINEAR PROX 30 (STAPLE) ×2
STAPLER RELOADABLE 30 BLU REG (STAPLE) IMPLANT
STAPLER VISISTAT 35W (STAPLE) ×2 IMPLANT
SUCTION POOLE TIP (SUCTIONS) ×2 IMPLANT
SURGILUBE 2OZ TUBE FLIPTOP (MISCELLANEOUS) ×2 IMPLANT
SUT PDS AB 1 TP1 96 (SUTURE) ×4 IMPLANT
SUT PROLENE 2 0 CT2 30 (SUTURE) IMPLANT
SUT PROLENE 2 0 KS (SUTURE) IMPLANT
SUT SILK 2 0 SH CR/8 (SUTURE) ×2 IMPLANT
SUT SILK 2 0 TIES 10X30 (SUTURE) ×2 IMPLANT
SUT SILK 3 0 SH CR/8 (SUTURE) ×2 IMPLANT
SUT SILK 3 0 TIES 10X30 (SUTURE) ×2 IMPLANT
SYR BULB IRRIGATION 50ML (SYRINGE) ×2 IMPLANT
SYS LAPSCP GELPORT 120MM (MISCELLANEOUS)
SYSTEM LAPSCP GELPORT 120MM (MISCELLANEOUS) IMPLANT
TOWEL OR 17X24 6PK STRL BLUE (TOWEL DISPOSABLE) ×1 IMPLANT
TOWEL OR 17X26 10 PK STRL BLUE (TOWEL DISPOSABLE) ×4 IMPLANT
TRAY FOLEY CATH 16FRSI W/METER (SET/KITS/TRAYS/PACK) ×2 IMPLANT
TRAY LAPAROSCOPIC MC (CUSTOM PROCEDURE TRAY) ×2 IMPLANT
TRAY PROCTOSCOPIC FIBER OPTIC (SET/KITS/TRAYS/PACK) ×2 IMPLANT
TROCAR XCEL 12X100 BLDLESS (ENDOMECHANICALS) IMPLANT
TROCAR XCEL BLUNT TIP 100MML (ENDOMECHANICALS) ×1 IMPLANT
TROCAR XCEL NON-BLD 11X100MML (ENDOMECHANICALS) IMPLANT
TROCAR XCEL NON-BLD 5MMX100MML (ENDOMECHANICALS) ×2 IMPLANT
TUBE CONNECTING 12X1/4 (SUCTIONS) ×5 IMPLANT
TUBE CONNECTING 20X1/4 (TUBING) ×1 IMPLANT
TUBING FILTER THERMOFLATOR (ELECTROSURGICAL) ×2 IMPLANT
TUBING INSUFFLATION (TUBING) ×2 IMPLANT
YANKAUER SUCT BULB TIP NO VENT (SUCTIONS) ×5 IMPLANT

## 2015-09-15 NOTE — Interval H&P Note (Signed)
History and Physical Interval Note: Patient tolerated the prep well. No recent abdominal pain. On exam: Awake and alert, no distress Lungs clear to auscultation Heart regular rate and rhythm, no murmurs Abdomen soft and nontender, no masses, bowel sounds present Extremities without tenderness or deformity Impression: we'll proceed with laparoscopic assisted sigmoid colectomy.  09/15/2015 8:23 AM  Kevin Young  has presented today for surgery, with the diagnosis of diverticulitis  The various methods of treatment have been discussed with the patient and family. After consideration of risks, benefits and other options for treatment, the patient has consented to  Procedure(s): LAPAROSCOPIC ASSISTED  SIGMOID COLECTOMY (N/A) as a surgical intervention .  The patient's history has been reviewed, patient re-examined, no change in status, stable for surgery.  I have reviewed the patient's chart and labs.  Questions were answered to the patient's satisfaction.     Harmonee Tozer E

## 2015-09-15 NOTE — Anesthesia Postprocedure Evaluation (Signed)
Anesthesia Post Note  Patient: Kevin Young  Procedure(s) Performed: Procedure(s) (LRB): LAPAROSCOPIC ASSISTED  SIGMOID COLECTOMY (N/A)  Patient location during evaluation: PACU Anesthesia Type: General Level of consciousness: sedated and patient cooperative Pain management: pain level controlled Vital Signs Assessment: post-procedure vital signs reviewed and stable Respiratory status: spontaneous breathing Cardiovascular status: stable Anesthetic complications: no    Last Vitals:  Filed Vitals:   09/15/15 1228 09/15/15 1242  BP: 141/81 139/105  Pulse: 79 82  Temp:    Resp: 15 14    Last Pain:  Filed Vitals:   09/15/15 1300  PainSc: [place]

## 2015-09-15 NOTE — Anesthesia Procedure Notes (Signed)
Procedure Name: Intubation Date/Time: 09/15/2015 9:08 AM Performed by: Jacquiline Doe A Pre-anesthesia Checklist: Patient identified, Timeout performed, Emergency Drugs available, Suction available and Patient being monitored Patient Re-evaluated:Patient Re-evaluated prior to inductionOxygen Delivery Method: Circle system utilized Preoxygenation: Pre-oxygenation with 100% oxygen Intubation Type: IV induction and Cricoid Pressure applied Ventilation: Mask ventilation without difficulty Laryngoscope Size: Mac and 4 Grade View: Grade I Tube type: Oral Tube size: 7.5 mm Number of attempts: 1 Airway Equipment and Method: Stylet Placement Confirmation: ETT inserted through vocal cords under direct vision,  breath sounds checked- equal and bilateral and positive ETCO2 Secured at: 23 cm Tube secured with: Tape Dental Injury: Teeth and Oropharynx as per pre-operative assessment

## 2015-09-15 NOTE — Op Note (Signed)
09/15/2015  11:49 AM  PATIENT:  Kevin Young  59 y.o. male  PRE-OPERATIVE DIAGNOSIS:  diverticulitis  POST-OPERATIVE DIAGNOSIS:  diverticulitis  PROCEDURE:  Procedure(s): LAPAROSCOPIC ASSISTED  SIGMOID COLECTOMY LAPAROSCOPIC MOBILIZATION OF THE SPLENIC FLEXURE RIGID SIGMOIDOSCOPY  SURGEON:  [name], M.D.  ASSISTANTS: Ralene Ok, M.D.   ANESTHESIA:   local and general  EBL:  Total I/O In: 2500 [I.V.:2000; IV Piggyback:500] Out: 500 [Urine:300; Blood:200]  BLOOD ADMINISTERED:none  DRAINS: none   SPECIMEN:  Excision  DISPOSITION OF SPECIMEN:  PATHOLOGY  COUNTS:  YES  DICTATION: .Dragon Dictation Findings: Chronic inflammatory changes consistent with diverticulitis of the sigmoid colon  Procedure in detail. Dejon presents for laparoscopic-assisted sigmoid colectomy. Informed consent was obtained. He received intravenous and buttocks. He was brought to the operative room and general endotracheal anesthesia was administered by the anesthesia staff. Foley catheter was placed by nursing. He was placed in lithotomy position. Abdomen and perineal area were prepped and draped in sterile fashion.We did time out procedure.The infraumbilical region was infiltrated with local. Infraumbilical incision was made. Subcutaneous tissues were dissected down revealing the anterior fascia. This was divided sharply along the midline. Peritoneal cavity was entered under direct vision without complication. A 0 Vicryl pursestring was placed around the fascial opening. Hassan trocar was inserted into the abdomen. The abdomen was insufflated with carbon dioxide in standard fashion. Under direct vision a 5 mm right lower quadrant and 5 mm right mid abdomen port were placed. Local was used at each port site. Laparoscopic exploration revealed chronic inflammatory changes of the proximal to mid sigmoid colon. This was carefully dissected from the lateral sidewall and mobilized. The left  colon was then mobilized from lateral peritoneal attachments. We placed additional ports in the right upper quadrant and left upper quadrant. This allowed excellent retraction. We continued mobilization of the left colon up to splenic flexure. The splenic flexure was then mobilized carefully. It was quite adherent and the areas around the spleen. The omentum from the region was also mobilized. We also mobilized the transverse colon from the omental attachments. Once we had excellent mobilization we made sure there was good hemostasis. At this point we reinspected the pelvis area the sigmoid was still quite stuck at the distal portion of the inflammatory process. We made a lower midline incision and subcutaneous tissues were dissected down and the fascia was divided, as a continuation of the infraumbilical port site. Placed a large wound protector. The inflamed sigmoid colon with: Was further dissected from the lateral sidewall. We then divided the sigmoid colon proximal to the beginning of the inflammatory process with GIA-75. We mobilized the sigmoid more distally and then the mesentery was taken with LigaSure. Additional 2-0 silk suture was used for good hemostasis. We stayed high above along the bowel and away from the ureter. The mesentery was taken down to the normal distal sigmoid region. There were no diverticuli or evidence of any disease distal to this area. The colon was divided there with GIA-75 and passed off labeled to pathology. Next, we decided to do an EEA anastomosis within the abdomen. The proximal colon had excellent length after splenic flexure mobilization.The distal staple line was removed and the colon was dilated up with the sizers to 29. A Prolene pursestring baseball stitch was placed around the colon edge and the anvil was inserted. The pursestring was tied securely and the fat was dissected away around the and. Next we made a small enterotomy in the proximal colon and the  EEA stapler was  inserted. The anastomosis was made in standard fashion to the distal sigmoid. There were excellent doughnuts and the enterotomy in the colon proximally was then closed with a TA-30. There was good hemostasis and the enterotomy closure and anastomosis were pink and viable. Next, I went below and did a rigid sigmoidoscopy insufflating the colon with air. Bowel clamp was placed by the assistant above and there was no leakage of air. There was no bleeding or other abnormality seen on rigid sigmoidoscopy.the abdomen was copiously irrigated and good hemostasis was ensured. We followed the colon protocol, changing drapes, instruments, gowns, and gloves. Bowel was returned to anatomic position. The lower midline fascia was closed with running #1 looped PDS from each end and tied in the middle. Subcutaneous tissues were irrigated and the skin was closed with staples. All port sites were closed with staples. Sterile dressings were applied. All counts were correct. He tolerated the procedure well without apparent complication and was taken recovery in stable condition.  PATIENT DISPOSITION:  PACU - hemodynamically stable.   Delay start of Pharmacological VTE agent (>24hrs) due to surgical blood loss or risk of bleeding:  no  [name], MD, MPH, FACS Pager: [phone]  12/12/201611:49 AM

## 2015-09-15 NOTE — Transfer of Care (Signed)
Immediate Anesthesia Transfer of Care Note  Patient: Kevin Young  Procedure(s) Performed: Procedure(s): LAPAROSCOPIC ASSISTED  SIGMOID COLECTOMY (N/A)  Patient Location: PACU  Anesthesia Type:General  Level of Consciousness: awake, alert  and oriented  Airway & Oxygen Therapy: Patient Spontanous Breathing and Patient connected to face mask oxygen  Post-op Assessment: Report given to RN and Post -op Vital signs reviewed and stable  Post vital signs: Reviewed and stable  Last Vitals:  Filed Vitals:   09/15/15 0707  BP: 132/74  Pulse: 73  Temp: 36.4 C  Resp: 18    Complications: No apparent anesthesia complications

## 2015-09-15 NOTE — Anesthesia Preprocedure Evaluation (Signed)
Anesthesia Evaluation  Patient identified by MRN, date of birth, ID band Patient awake    Reviewed: Allergy & Precautions, NPO status , Patient's Chart, lab work & pertinent test results  Airway Mallampati: II  TM Distance: >3 FB Neck ROM: Full    Dental no notable dental hx.    Pulmonary sleep apnea , former smoker,    Pulmonary exam normal breath sounds clear to auscultation       Cardiovascular negative cardio ROS Normal cardiovascular exam Rhythm:Regular Rate:Normal     Neuro/Psych negative neurological ROS  negative psych ROS   GI/Hepatic negative GI ROS, Neg liver ROS,   Endo/Other  Morbid obesity  Renal/GU negative Renal ROS  negative genitourinary   Musculoskeletal negative musculoskeletal ROS (+)   Abdominal   Peds negative pediatric ROS (+)  Hematology negative hematology ROS (+)   Anesthesia Other Findings   Reproductive/Obstetrics negative OB ROS                             Anesthesia Physical Anesthesia Plan  ASA: III  Anesthesia Plan: General   Post-op Pain Management:    Induction: Intravenous  Airway Management Planned: Oral ETT  Additional Equipment:   Intra-op Plan:   Post-operative Plan: Extubation in OR  Informed Consent: I have reviewed the patients History and Physical, chart, labs and discussed the procedure including the risks, benefits and alternatives for the proposed anesthesia with the patient or authorized representative who has indicated his/her understanding and acceptance.   Dental advisory given  Plan Discussed with: CRNA and Surgeon  Anesthesia Plan Comments:         Anesthesia Quick Evaluation

## 2015-09-16 ENCOUNTER — Encounter (HOSPITAL_COMMUNITY): Payer: Self-pay | Admitting: General Surgery

## 2015-09-16 LAB — BASIC METABOLIC PANEL
ANION GAP: 9 (ref 5–15)
BUN: 11 mg/dL (ref 6–20)
CALCIUM: 8.7 mg/dL — AB (ref 8.9–10.3)
CO2: 25 mmol/L (ref 22–32)
CREATININE: 0.94 mg/dL (ref 0.61–1.24)
Chloride: 105 mmol/L (ref 101–111)
GFR calc non Af Amer: >60 mL/min (ref >60)
Glucose, Bld: 139 mg/dL — ABNORMAL HIGH (ref 65–99)
Potassium: 4.5 mmol/L (ref 3.5–5.1)
SODIUM: 139 mmol/L (ref 135–145)

## 2015-09-16 LAB — CBC
HCT: 40.4 % (ref 39.0–52.0)
Hemoglobin: 13.8 g/dL (ref 13.0–17.0)
MCH: 30.9 pg (ref 26.0–34.0)
MCHC: 34.2 g/dL (ref 30.0–36.0)
MCV: 90.6 fL (ref 78.0–100.0)
PLATELETS: 171 10*3/uL (ref 150–400)
RBC: 4.46 MIL/uL (ref 4.22–5.81)
RDW: 13.5 % (ref 11.5–15.5)
WBC: 15.5 10*3/uL — AB (ref 4.0–10.5)

## 2015-09-16 MED ORDER — ZOLPIDEM TARTRATE 5 MG PO TABS
5.0000 mg | ORAL_TABLET | Freq: Every evening | ORAL | Status: DC | PRN
  Administered 2015-09-16: 5 mg via ORAL
  Filled 2015-09-16: qty 1

## 2015-09-16 MED ORDER — MORPHINE SULFATE (PF) 2 MG/ML IV SOLN
2.0000 mg | INTRAVENOUS | Status: DC | PRN
  Administered 2015-09-16 – 2015-09-17 (×4): 4 mg via INTRAVENOUS
  Filled 2015-09-16: qty 2
  Filled 2015-09-16: qty 1
  Filled 2015-09-16 (×2): qty 2
  Filled 2015-09-16: qty 1

## 2015-09-16 MED ORDER — DIPHENHYDRAMINE HCL 50 MG/ML IJ SOLN
25.0000 mg | Freq: Every evening | INTRAMUSCULAR | Status: DC | PRN
Start: 2015-09-16 — End: 2015-09-20

## 2015-09-16 NOTE — Care Management Note (Signed)
Case Management Note  Patient Details  Name: Kevin Young MRN: [id number] Date of Birth: 1956/03/12  Subjective/Objective:     Pt s/p small bowel resection for diverticulitis on 09/15/15.  PTA, pt independent, lives with spouse.                 Action/Plan: Will follow for discharge planning as pt progresses.    Expected Discharge Date:                  Expected Discharge Plan:     In-House Referral:     Discharge planning Services     Post Acute Care Choice:    Choice offered to:     DME Arranged:    DME Agency:     HH Arranged:    [place] Agency:     Status of Service:     Medicare Important Message Given:    Date Medicare IM Given:    Medicare IM give by:    Date Additional Medicare IM Given:    Additional Medicare Important Message give by:     If discussed at [place] of Stay Meetings, dates discussed:    Additional Comments:  Reinaldo Raddle, RN, BSN  Trauma/Neuro ICU Case Manager [phone]

## 2015-09-16 NOTE — Progress Notes (Signed)
Dilaudid PCa d/c`d per MD order. Wasted Dilaudid PCA 65ml (5mg ) to sink,witnessed by Esaw Dace

## 2015-09-16 NOTE — Progress Notes (Signed)
Patient ID: Kevin Young, male   DOB: Feb 03, 1956, 59 y.o.   MRN: [name] Up in the chair. His brother is visiting. I discussed his pathology results with him. [name], MD, MPH, FACS Trauma: [phone] General Surgery: [phone]

## 2015-09-16 NOTE — Progress Notes (Signed)
1 Day Post-Op  Subjective: Sore, usin gice pack, PCA helping, no flatus, no nausea. C/O insomina.  Objective: Vital signs in last 24 hours: Temp:  [97.6 F (36.4 C)-99 F (37.2 C)] 98.2 F (36.8 C) (12/13 0544) Pulse Rate:  [71-94] 74 (12/13 0544) Resp:  [10-24] 20 (12/13 0544) BP: (123-147)/(75-105) 124/75 mmHg (12/13 0544) SpO2:  [91 %-99 %] 98 % (12/13 0544) Weight:  [112.95 kg (249 lb 0.2 oz)] 112.95 kg (249 lb 0.2 oz) (12/12 1600) Last BM Date: 09/15/15 (before surgery)  Intake/Output from previous day: 12/12 0701 - 12/13 0700 In: 4957.9 [P.O.:160; I.V.:4297.9; IV Piggyback:500] Out: 3200 [Urine:3000; Blood:200] Intake/Output this shift:    General appearance: alert and appears stated age Resp: clear to auscultation bilaterally Cardio: regular rate and rhythm GI: soft, dressings with dry staining, a few BS  Lab Results:   Recent Labs  09/15/15 1608 09/16/15 0427  WBC 12.5* 15.5*  HGB 13.7 13.8  HCT 40.9 40.4  PLT 171 171   BMET  Recent Labs  09/15/15 1608 09/16/15 0427  NA  --  139  K  --  4.5  CL  --  105  CO2  --  25  GLUCOSE  --  139*  BUN  --  11  CREATININE 1.06 0.94  CALCIUM  --  8.7*   PT/INR No results for input(s): LABPROT, INR in the last 72 hours. ABG No results for input(s): PHART, HCO3 in the last 72 hours.  Invalid input(s): PCO2, PO2  Studies/Results: No results found.  Anti-infectives: Anti-infectives    Start     Dose/Rate Route Frequency Ordered Stop   09/15/15 0600  cefoTEtan in Dextrose 5% (CEFOTAN) IVPB 2 g     2 g Intravenous On call to O.R. 09/14/15 1858 09/15/15 0925      Assessment/Plan: S/P lap assisted sigmoid colectomy POD#1 - await return of bowel function, water and ice PO, Entereg BPH - continue home Flomax, D/C foley tomorrow Insomnia - try Ambien PO, Benadryl IV as needed FEN - lytes OK VTE - Lovenox  LOS: 1 day    Chau Sawin E 09/16/2015

## 2015-09-17 LAB — CBC
HCT: 41.8 % (ref 39.0–52.0)
Hemoglobin: 14.2 g/dL (ref 13.0–17.0)
MCH: 30.7 pg (ref 26.0–34.0)
MCHC: 34 g/dL (ref 30.0–36.0)
MCV: 90.3 fL (ref 78.0–100.0)
PLATELETS: 175 10*3/uL (ref 150–400)
RBC: 4.63 MIL/uL (ref 4.22–5.81)
RDW: 13.6 % (ref 11.5–15.5)
WBC: 13 10*3/uL — AB (ref 4.0–10.5)

## 2015-09-17 LAB — BASIC METABOLIC PANEL
ANION GAP: 8 (ref 5–15)
BUN: 8 mg/dL (ref 6–20)
CALCIUM: 9 mg/dL (ref 8.9–10.3)
CO2: 27 mmol/L (ref 22–32)
CREATININE: 0.83 mg/dL (ref 0.61–1.24)
Chloride: 103 mmol/L (ref 101–111)
GFR calc Af Amer: >60 mL/min (ref >60)
GLUCOSE: 154 mg/dL — AB (ref 65–99)
Potassium: 4.2 mmol/L (ref 3.5–5.1)
Sodium: 138 mmol/L (ref 135–145)

## 2015-09-17 MED ORDER — MORPHINE SULFATE (PF) 2 MG/ML IV SOLN
2.0000 mg | INTRAVENOUS | Status: DC | PRN
  Administered 2015-09-17 (×4): 4 mg via INTRAVENOUS
  Administered 2015-09-17 – 2015-09-18 (×2): 2 mg via INTRAVENOUS
  Administered 2015-09-18: 4 mg via INTRAVENOUS
  Filled 2015-09-17 (×2): qty 2
  Filled 2015-09-17: qty 1
  Filled 2015-09-17 (×2): qty 2
  Filled 2015-09-17: qty 1
  Filled 2015-09-17: qty 2

## 2015-09-17 NOTE — Progress Notes (Signed)
2 Days Post-Op  Subjective: Dilaudid PCA D/Cd overnight as it was making him feel hot. Morphine controlling pain. No nausea but no flatus yet.  Objective: Vital signs in last 24 hours: Temp:  [97.9 F (36.6 C)-98.4 F (36.9 C)] 97.9 F (36.6 C) (12/14 0645) Pulse Rate:  [77-89] 89 (12/14 0645) Resp:  [16-22] 19 (12/14 0645) BP: (120-142)/(70-86) 142/81 mmHg (12/14 0645) SpO2:  [92 %-98 %] 97 % (12/14 0645) Last BM Date: 09/15/15 (before surgery)  Intake/Output from previous day: 12/13 0701 - 12/14 0700 In: 2361.7 [P.O.:360; I.V.:2001.7] Out: 3200 [Urine:3200] Intake/Output this shift:    General appearance: alert and cooperative Resp: clear to auscultation bilaterally GI: soft, + some BS, dry stain on dressings. some distention  Lab Results:   Recent Labs  09/16/15 0427 09/17/15 0619  WBC 15.5* 13.0*  HGB 13.8 14.2  HCT 40.4 41.8  PLT 171 175   BMET  Recent Labs  09/15/15 1608 09/16/15 0427  NA  --  139  K  --  4.5  CL  --  105  CO2  --  25  GLUCOSE  --  139*  BUN  --  11  CREATININE 1.06 0.94  CALCIUM  --  8.7*   PT/INR No results for input(s): LABPROT, INR in the last 72 hours. ABG No results for input(s): PHART, HCO3 in the last 72 hours.  Invalid input(s): PCO2, PO2  Studies/Results: No results found.  Anti-infectives: Anti-infectives    Start     Dose/Rate Route Frequency Ordered Stop   09/15/15 0600  cefoTEtan in Dextrose 5% (CEFOTAN) IVPB 2 g     2 g Intravenous On call to O.R. 09/14/15 1858 09/15/15 0925      Assessment/Plan: S/P lap assisted sigmoid colectomy POD#2 - await return of bowel function, sips of clears, Entereg BPH - continue home Flomax, D/C foley today FEN - lytes P VTE - Lovenox I also spoke with his sister  LOS: 2 days    Kizer Nobbe E 09/17/2015

## 2015-09-18 MED ORDER — OXYCODONE HCL 5 MG PO TABS
5.0000 mg | ORAL_TABLET | ORAL | Status: DC | PRN
  Administered 2015-09-18 – 2015-09-20 (×8): 10 mg via ORAL
  Filled 2015-09-18 (×8): qty 2

## 2015-09-18 NOTE — Progress Notes (Signed)
3 Days Post-Op  Subjective: Had BM and a lot of flatus  Objective: Vital signs in last 24 hours: Temp:  [97.8 F (36.6 C)-98.4 F (36.9 C)] 97.8 F (36.6 C) (12/15 0622) Pulse Rate:  [84-92] 92 (12/15 0622) Resp:  [17-20] 20 (12/15 0622) BP: (131-152)/(76-93) 145/93 mmHg (12/15 0622) SpO2:  [95 %-98 %] 95 % (12/15 0622) Last BM Date: 09/15/15  Intake/Output from previous day: 12/14 0701 - 12/15 0700 In: 540 [P.O.:540] Out: 2200 [Urine:2200] Intake/Output this shift:    General appearance: alert and cooperative Resp: clear to auscultation bilaterally GI: soft, port site dressings removed - CDI. Lower midline with some ecchymosis along R, dry stain. +BS   Lab Results:   Recent Labs  09/16/15 0427 09/17/15 0619  WBC 15.5* 13.0*  HGB 13.8 14.2  HCT 40.4 41.8  PLT 171 175   BMET  Recent Labs  09/16/15 0427 09/17/15 0619  NA 139 138  K 4.5 4.2  CL 105 103  CO2 25 27  GLUCOSE 139* 154*  BUN 11 8  CREATININE 0.94 0.83  CALCIUM 8.7* 9.0   PT/INR No results for input(s): LABPROT, INR in the last 72 hours. ABG No results for input(s): PHART, HCO3 in the last 72 hours.  Invalid input(s): PCO2, PO2  Studies/Results: No results found.  Anti-infectives: Anti-infectives    Start     Dose/Rate Route Frequency Ordered Stop   09/15/15 0600  cefoTEtan in Dextrose 5% (CEFOTAN) IVPB 2 g     2 g Intravenous On call to O.R. 09/14/15 1858 09/15/15 0925      Assessment/Plan: S/P lap assisted sigmoid colectomy POD#3 - advance to fulls BPH - continue home Flomax FEN - [place] IVF, start oral pain meds VTE - Lovenox Dispo - possible D/C 12/15  LOS: 3 days    Twanna Resh E 09/18/2015

## 2015-09-19 MED ORDER — OXYCODONE HCL 5 MG PO TABS: 5.0000 mg | ORAL_TABLET | ORAL | Status: DC | PRN

## 2015-09-19 NOTE — Progress Notes (Signed)
4 Days Post-Op  Subjective: Up walking early this AM. Fels bloated. Flatus but no further BMs. Not taking much pain medicine.  Objective: Vital signs in last 24 hours: Temp:  [98.2 F (36.8 C)-98.5 F (36.9 C)] 98.2 F (36.8 C) (12/16 0438) Pulse Rate:  [83-89] 87 (12/16 0438) Resp:  [18] 18 (12/16 0438) BP: (129)/(65-80) 129/80 mmHg (12/16 0438) SpO2:  [96 %-97 %] 96 % (12/16 0438) Last BM Date: 09/17/15 (small)  Intake/Output from previous day: 12/15 0701 - 12/16 0700 In: 1470 [P.O.:1470] Out: 1900 [Urine:1900] Intake/Output this shift:    General appearance: cooperative Lungs: CTA Abs: soft, less distended, +BS, dressing removed and lower midline incision CDI  Lab Results:   Recent Labs  09/17/15 0619  WBC 13.0*  HGB 14.2  HCT 41.8  PLT 175   BMET  Recent Labs  09/17/15 0619  NA 138  K 4.2  CL 103  CO2 27  GLUCOSE 154*  BUN 8  CREATININE 0.83  CALCIUM 9.0   PT/INR No results for input(s): LABPROT, INR in the last 72 hours. ABG No results for input(s): PHART, HCO3 in the last 72 hours.  Invalid input(s): PCO2, PO2  Studies/Results: No results found.  Anti-infectives: Anti-infectives    Start     Dose/Rate Route Frequency Ordered Stop   09/15/15 0600  cefoTEtan in Dextrose 5% (CEFOTAN) IVPB 2 g     2 g Intravenous On call to O.R. 09/14/15 1858 09/15/15 0925      Assessment/Plan: S/P lap assisted sigmoid colectomy POD#4 - advance to regular BPH - continue home Flomax FEN - saline lock IVF VTE - Lovenox Dispo - plan D/C 12/17 if bowel function fully returns   LOS: 4 days    Kiptyn Rafuse E 09/19/2015

## 2015-09-20 MED ORDER — PANTOPRAZOLE SODIUM 40 MG PO TBEC: 40.0000 mg | DELAYED_RELEASE_TABLET | Freq: Every day | ORAL | Status: DC

## 2015-09-20 MED ORDER — OXYCODONE HCL 5 MG PO TABS: 5.0000 mg | ORAL_TABLET | ORAL | Status: DC | PRN

## 2015-09-20 MED ORDER — DIPHENHYDRAMINE HCL 25 MG PO CAPS: 25.0000 mg | ORAL_CAPSULE | Freq: Every evening | ORAL | Status: DC | PRN

## 2015-09-20 NOTE — Discharge Summary (Signed)
Physician Discharge Summary  Patient ID: Kevin Young MRN: [id number] DOB/AGE: [birth year] 58 y.o.  Admit date: 09/15/2015 Discharge date: 09/20/2015  Admission Diagnoses:  diverticulitis  Discharge Diagnoses:  Same post colectomy  Active Problems:   S/P laparoscopic-assisted sigmoidectomy   Surgery:  Lap assisted sigmoid colecotmy  Discharged Condition: improved  Hospital Course:   Had surgery per [name].  Postop progress as expected for comorbid condition.  Fever resolved and patient desiring discharge on Saturday morning.    Consults: none  Significant Diagnostic Studies: none noted     Discharge Exam: Blood pressure 133/80, pulse 84, temperature 97.9 F (36.6 C), temperature source Axillary, resp. rate 15, height 5' 5.5" (1.664 m), weight 112.95 kg (249 lb 0.2 oz), SpO2 97 %. Incisions with staples;  Slight ecchymosis in lower part of incision  Disposition: 01-Home or Self Care  Discharge Instructions    Diet - low sodium heart healthy    Complete by:  As directed      Discharge instructions    Complete by:  As directed   Shower ad lib.  Apply neopsporin to staple line.  Call office and have staples removed on Dec 27.     Increase activity slowly    Complete by:  As directed             Medication List    STOP taking these medications        oxyCODONE-acetaminophen 5-325 MG tablet  Commonly known as:  PERCOCET/ROXICET     predniSONE 20 MG tablet  Commonly known as:  DELTASONE      TAKE these medications        ALEVE 220 MG tablet  Generic drug:  naproxen sodium  Take 440 mg by mouth 2 (two) times daily as needed.     aspirin 81 MG tablet  Take 81 mg by mouth daily.     atorvastatin 20 MG tablet  Commonly known as:  LIPITOR  Take 0.5 tablets (10 mg total) by mouth daily.     cetirizine 10 MG tablet  Commonly known as:  ZYRTEC  Take 10 mg by mouth daily.     finasteride 5 MG tablet  Commonly known as:  PROSCAR  Take 1 tablet  (5 mg total) by mouth daily.     Fish Oil 1200 MG Caps  Take 3 capsules by mouth daily.     fluticasone 50 MCG/ACT nasal spray  Commonly known as:  FLONASE  Place 2 sprays into both nostrils daily.     Melatonin 5 MG Caps  Take 2 capsules by mouth at bedtime.     montelukast 10 MG tablet  Commonly known as:  SINGULAIR  Take 1 tablet (10 mg total) by mouth at bedtime.     multivitamin with minerals tablet  Take 1 tablet by mouth daily.     oxyCODONE 5 MG immediate release tablet  Commonly known as:  Oxy IR/ROXICODONE  Take 1 tablet (5 mg total) by mouth every 4 (four) hours as needed (5mg  for mild to moderate pain, 10mg  for severe pain).     PROBIOTIC DAILY PO  Take 1 capsule by mouth daily.     tamsulosin 0.4 MG Caps capsule  Commonly known as:  FLOMAX  1-2 tabs po qhs           Follow-up Information    Follow up with [facility] On 09/26/2015.   Specialty:  General Surgery   Why:  For suture removal  Contact information:   [address] [place] [phone]      Signed: Pedro Earls 09/20/2015, 8:15 AM

## 2015-09-20 NOTE — Progress Notes (Signed)
Pt temp was 102.2, pt was wearing his jacket rechecked temp 98.2 afebrile.

## 2015-09-20 NOTE — Progress Notes (Signed)
Discharge instructions gone over with patient. Home medications gone over. Prescription given. Diet, activity, and reasons to call the doctor gone over. Incisional care gone over and follow up appointment to be made. Patient verbalized understanding of instructions.

## 2015-10-27 ENCOUNTER — Ambulatory Visit (INDEPENDENT_AMBULATORY_CARE_PROVIDER_SITE_OTHER): Payer: Managed Care, Other (non HMO) | Admitting: Family Medicine

## 2015-10-27 ENCOUNTER — Encounter: Payer: Self-pay | Admitting: Family Medicine

## 2015-10-27 VITALS — BP 126/79 | HR 72 | Temp 98.3°F | Resp 16 | Ht 65.5 in | Wt 234.5 lb

## 2015-10-27 DIAGNOSIS — K645 Perianal venous thrombosis: Secondary | ICD-10-CM

## 2015-10-27 NOTE — Progress Notes (Signed)
OFFICE VISIT  10/27/2015   CC:  Chief Complaint  Patient presents with  . Hemorrhoids    x 1 week    HPI:    Patient is a 60 y.o. Caucasian male who presents for hemorrhoid. Says felt small bulge at anal verge x 1 week or so, started to hurt and then it hurt mod/severe and he was going to come in and have it lanced as Dr. Sherren Mocha has done in the past.  However, in the last 3-4 d it has opened up and drained some blood and it no longer hurts, he no longer feels any bulge in the area.   We discussed constipation and how narcotics can sometimes make this worse, leading to more probs with hemorrhoids.  Past Medical History  Diagnosis Date  . Hyperlipidemia   . IBS (irritable bowel syndrome)   . Allergic rhinitis   . OSA (obstructive sleep apnea)   . Anxiety and depression   . Obesity, Class II, BMI 35-39.9   . Hepatic steatosis   . Nephrolithiasis 12/23/14    46mm nonobstructive R renal calc on CT abd/pelv done for diverticulitis  . History of adenomatous polyp of colon   . Diverticulitis large intestine 2015/16    Recurrent: GI MD [name].  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  . Frequent urination at night     Takes Flomax  . Skin cancer     Basal cell removed from left hand    Past Surgical History  Procedure Laterality Date  . Tonsillectomy    . Colonoscopy  10/17/2002;  07/26/13    Hx of polyps.  Recall 07/2017 per [name]  . Hernia repair      multiple.  Right inguinal repair by Dr. [name] in 2004.  [name] Esophagogastroduodenoscopy      need specifics from pt  . Laparoscopic sigmoid colectomy N/A 09/15/2015    Procedure: LAPAROSCOPIC ASSISTED  SIGMOID COLECTOMY;  Surgeon: [name], MD;  Location: [place];  Service: General;  Laterality: N/A;    Outpatient Prescriptions Prior to Visit  Medication Sig Dispense Refill  . aspirin 81 MG tablet Take 81 mg by mouth daily.      [name] atorvastatin (LIPITOR) 20 MG tablet  Take 0.5 tablets (10 mg total) by mouth daily. 45 tablet 3  . cetirizine (ZYRTEC) 10 MG tablet Take 10 mg by mouth daily.    . finasteride (PROSCAR) 5 MG tablet Take 1 tablet (5 mg total) by mouth daily. 30 tablet 6  . fluticasone (FLONASE) 50 MCG/ACT nasal spray Place 2 sprays into both nostrils daily. 48 g 3  . Melatonin 5 MG CAPS Take 2 capsules by mouth at bedtime.    . montelukast (SINGULAIR) 10 MG tablet Take 1 tablet (10 mg total) by mouth at bedtime. 90 tablet 3  . Multiple Vitamins-Minerals (MULTIVITAMIN WITH MINERALS) tablet Take 1 tablet by mouth daily.    . naproxen sodium (ALEVE) 220 MG tablet Take 440 mg by mouth 2 (two) times daily as needed.    . Omega-3 Fatty Acids (FISH OIL) 1200 MG CAPS Take 3 capsules by mouth daily.     [name] oxyCODONE (OXY IR/ROXICODONE) 5 MG immediate release tablet Take 1 tablet (5 mg total) by mouth every 4 (four) hours as needed (5mg  for mild to moderate pain, 10mg  for severe pain). 40 tablet 0  . tamsulosin (FLOMAX) 0.4 MG CAPS capsule 1-2 tabs po qhs (Patient taking  differently: Take 0.8 mg by mouth daily. 1-2 tabs po qhs) 60 capsule 6  . Probiotic Product (PROBIOTIC DAILY PO) Take 1 capsule by mouth daily. Reported on 10/27/2015     No facility-administered medications prior to visit.    No Known Allergies  ROS As per HPI  PE: Blood pressure 126/79, pulse 72, temperature 98.3 F (36.8 C), temperature source Oral, resp. rate 16, height 5' 5.5" (1.664 m), weight 234 lb 8 oz (106.369 kg), SpO2 93 %.  Pt examined with Sharen Hones, CMA, as chaperone.  Gen: Alert, well appearing.  Patient is oriented to person, place, time, and situation. Anal exam: no hemorrhoids, masses, or fissures are visible on inspection.  DRE not done today.  LABS:  none  IMPRESSION AND PLAN:  Thrombosed external hemorrhoid: now resolved spontaneously. Exam normal today. Discussed prn (or more regular) use of stool softener and/or laxative to keep constipation from being  an issue, esp while on narcotic pain meds.  An After Visit Summary was printed and given to the patient.  FOLLOW UP: Return for keep appt in April 2017. (CPE)

## 2015-10-27 NOTE — Progress Notes (Signed)
Pre visit review using our clinic review tool, if applicable. No additional management support is needed unless otherwise documented below in the visit note. 

## 2015-11-06 ENCOUNTER — Encounter: Payer: Self-pay | Admitting: Family Medicine

## 2015-12-31 ENCOUNTER — Other Ambulatory Visit: Payer: Self-pay | Admitting: *Deleted

## 2015-12-31 MED ORDER — ATORVASTATIN CALCIUM 20 MG PO TABS: 10.0000 mg | ORAL_TABLET | Freq: Every day | ORAL | Status: DC

## 2015-12-31 NOTE — Telephone Encounter (Signed)
RF request for atorvastatin LOV: 09/12/15 Next ov: 01/29/16 Last written: 01/22/15 #45 w/ 3RF

## 2016-01-29 ENCOUNTER — Encounter: Payer: Self-pay | Admitting: Family Medicine

## 2016-01-29 ENCOUNTER — Ambulatory Visit (INDEPENDENT_AMBULATORY_CARE_PROVIDER_SITE_OTHER): Payer: Managed Care, Other (non HMO) | Admitting: Family Medicine

## 2016-01-29 VITALS — BP 124/86 | HR 57 | Temp 98.0°F | Resp 16 | Ht 65.0 in | Wt 238.2 lb

## 2016-01-29 DIAGNOSIS — Z0000 Encounter for general adult medical examination without abnormal findings: Secondary | ICD-10-CM | POA: Diagnosis not present

## 2016-01-29 LAB — CBC WITH DIFFERENTIAL/PLATELET
BASOS PCT: 0 %
Basophils Absolute: 0 cells/uL (ref 0–200)
EOS ABS: 110 {cells}/uL (ref 15–500)
Eosinophils Relative: 2 %
HEMATOCRIT: 46.1 % (ref 38.5–50.0)
Hemoglobin: 15.6 g/dL (ref 13.2–17.1)
Lymphocytes Relative: 34 %
Lymphs Abs: 1870 cells/uL (ref 850–3900)
MCH: 30.4 pg (ref 27.0–33.0)
MCHC: 33.8 g/dL (ref 32.0–36.0)
MCV: 89.9 fL (ref 80.0–100.0)
MONO ABS: 605 {cells}/uL (ref 200–950)
MPV: 11.5 fL (ref 7.5–12.5)
Monocytes Relative: 11 %
NEUTROS ABS: 2915 {cells}/uL (ref 1500–7800)
Neutrophils Relative %: 53 %
PLATELETS: 199 10*3/uL (ref 140–400)
RBC: 5.13 MIL/uL (ref 4.20–5.80)
RDW: 14.2 % (ref 11.0–15.0)
WBC: 5.5 10*3/uL (ref 3.8–10.8)

## 2016-01-29 LAB — COMPREHENSIVE METABOLIC PANEL
ALBUMIN: 4.2 g/dL (ref 3.6–5.1)
ALT: 34 U/L (ref 9–46)
AST: 29 U/L (ref 10–35)
Alkaline Phosphatase: 50 U/L (ref 40–115)
BUN: 14 mg/dL (ref 7–25)
CALCIUM: 9.1 mg/dL (ref 8.6–10.3)
CHLORIDE: 103 mmol/L (ref 98–110)
CO2: 25 mmol/L (ref 20–31)
Creat: 0.96 mg/dL (ref 0.70–1.33)
Glucose, Bld: 91 mg/dL (ref 65–99)
POTASSIUM: 4.4 mmol/L (ref 3.5–5.3)
Sodium: 139 mmol/L (ref 135–146)
TOTAL PROTEIN: 6.5 g/dL (ref 6.1–8.1)
Total Bilirubin: 0.6 mg/dL (ref 0.2–1.2)

## 2016-01-29 LAB — LIPID PANEL
CHOL/HDL RATIO: 2.5 ratio (ref <=5.0)
Cholesterol: 143 mg/dL (ref 125–200)
HDL: 57 mg/dL (ref >=40)
LDL CALC: 75 mg/dL (ref <130)
TRIGLYCERIDES: 57 mg/dL (ref <150)
VLDL: 11 mg/dL (ref <30)

## 2016-01-29 LAB — TSH: TSH: 1.09 mIU/L (ref 0.40–4.50)

## 2016-01-29 NOTE — Progress Notes (Signed)
Office Note 01/29/2016  CC:  Chief Complaint  Patient presents with  . Annual Exam    Pt is fasting.   HPI:  Kevin Young is a 60 y.o. White male who is here for annual health maintenance exam. Diet is poor. Exercise: walks 5 d/week but not vigorously. He is due for eye exam.  No acute complaints.  Past Medical History  Diagnosis Date  . Hyperlipidemia   . IBS (irritable bowel syndrome)   . Allergic rhinitis   . OSA (obstructive sleep apnea)   . Anxiety and depression   . Obesity, Class II, BMI 35-39.9   . Hepatic steatosis   . Nephrolithiasis 12/23/14    37mm nonobstructive R renal calc on CT abd/pelv done for diverticulitis  . History of adenomatous polyp of colon   . Diverticulitis large intestine 2015/16    Recurrent: GI MD [name].  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  [name] BPH (benign prostatic hyperplasia)     Nocturia is his primary symptom:  Takes Flomax  . Skin cancer     Basal cell removed from left hand  . Eustachian tube dysfunction     with recurrent "sinus issues"--ENT (Dr. [name]) recommended surgery for nasal turbinate reduction and septoplasty--as of 11/03/15)    Past Surgical History  Procedure Laterality Date  . Tonsillectomy    . Colonoscopy  10/17/2002;  07/26/13    Hx of polyps.  Recall 07/2017 per [name]  . Hernia repair      multiple.  Right inguinal repair by Dr. [name] in 2004.  [name] Esophagogastroduodenoscopy      need specifics from pt  . Laparoscopic sigmoid colectomy N/A 09/15/2015    Procedure: LAPAROSCOPIC ASSISTED  SIGMOID COLECTOMY;  Surgeon: [name], MD;  Location: [facility];  Service: General;  Laterality: N/A;    Family History  Problem Relation Age of Onset  . GI problems Mother   . Cancer Father     skin  . Hypertension Father   . Glaucoma Father   . Migraines Sister   . Seizures Brother   . Depression Brother     Social History   Social History   . Marital Status: Married    Spouse Name: N/A  . Number of Children: N/A  . Years of Education: N/A   Occupational History  . truck driver    Social History Main Topics  . Smoking status: Former Smoker    Types: Cigars    Quit date: 12/02/2009  . Smokeless tobacco: Former Systems developer    Types: [place] date: 08/23/2013  . Alcohol Use: 3.0 oz/week    5 Cans of beer per week     Comment: 7-10 a week  . Drug Use: No  . Sexual Activity: Not on file   Other Topics Concern  . Not on file   Social History Narrative   Married, step children.   Orig from [facility]   Occupation: delivery driver.   Former Facilities manager tob user, quit 2010.   Alcohol: 1 beer a day.    Outpatient Prescriptions Prior to Visit  Medication Sig Dispense Refill  . aspirin 81 MG tablet Take 81 mg by mouth daily.      [name] atorvastatin (LIPITOR) 20 MG tablet Take 0.5 tablets (10 mg total) by mouth daily. 45 tablet 3  . finasteride (PROSCAR) 5 MG tablet Take 1 tablet (5 mg total) by mouth  daily. 30 tablet 6  . Melatonin 5 MG CAPS Take 2 capsules by mouth at bedtime.    . montelukast (SINGULAIR) 10 MG tablet Take 1 tablet (10 mg total) by mouth at bedtime. 90 tablet 3  . Multiple Vitamins-Minerals (MULTIVITAMIN WITH MINERALS) tablet Take 1 tablet by mouth daily.    . naproxen sodium (ALEVE) 220 MG tablet Take 440 mg by mouth 2 (two) times daily as needed.    . Omega-3 Fatty Acids (FISH OIL) 1200 MG CAPS Take 3 capsules by mouth daily.     [name] oxyCODONE (OXY IR/ROXICODONE) 5 MG immediate release tablet Take 1 tablet (5 mg total) by mouth every 4 (four) hours as needed (5mg  for mild to moderate pain, 10mg  for severe pain). 40 tablet 0  . tamsulosin (FLOMAX) 0.4 MG CAPS capsule 1-2 tabs po qhs (Patient taking differently: Take 0.8 mg by mouth daily. 1-2 tabs po qhs) 60 capsule 6  . cetirizine (ZYRTEC) 10 MG tablet Take 10 mg by mouth daily. Reported on 01/29/2016    . fluticasone (FLONASE) 50 MCG/ACT nasal spray Place  2 sprays into both nostrils daily. (Patient not taking: Reported on 01/29/2016) 48 g 3   No facility-administered medications prior to visit.    No Known Allergies  ROS Review of Systems  Constitutional: Negative for fever, chills, appetite change and fatigue.  HENT: Positive for sinus pressure (and bilat ear pressure on/off--has deviated septum causing these sx's). Negative for congestion, dental problem, ear pain and sore throat.   Eyes: Negative for discharge, redness and visual disturbance.  Respiratory: Negative for cough, chest tightness, shortness of breath and wheezing.   Cardiovascular: Negative for chest pain, palpitations and leg swelling.  Gastrointestinal: Negative for nausea, vomiting, abdominal pain, diarrhea and blood in stool.  Genitourinary: Negative for dysuria, urgency, frequency, hematuria, flank pain and difficulty urinating.  Musculoskeletal: Negative for myalgias, back pain, joint swelling, arthralgias and neck stiffness.  Skin: Negative for pallor and rash.  Neurological: Negative for dizziness, speech difficulty, weakness and headaches.  Hematological: Negative for adenopathy. Does not bruise/bleed easily.  Psychiatric/Behavioral: Negative for confusion and sleep disturbance. The patient is not nervous/anxious.     PE; Blood pressure 124/86, pulse 57, temperature 98 F (36.7 C), temperature source Oral, resp. rate 16, height 5\' 5"  (1.651 m), weight 238 lb 4 oz (108.069 kg), SpO2 95 %. Gen: Alert, well appearing.  Patient is oriented to person, place, time, and situation. AFFECT: pleasant, lucid thought and speech. ENT: Ears: EACs clear, normal epithelium.  TMs with good light reflex and landmarks bilaterally.  Eyes: no injection, icteris, swelling, or exudate.  EOMI, PERRLA. Nose: no drainage or turbinate edema/swelling.  No injection or focal lesion.  Mouth: lips without lesion/swelling.  Oral mucosa pink and moist.  Dentition intact and without obvious caries  or gingival swelling.  Oropharynx without erythema, exudate, or swelling.  Neck: supple/nontender.  No LAD, mass, or TM.  Carotid pulses 2+ bilaterally, without bruits. CV: RRR, no m/r/g.   LUNGS: CTA bilat, nonlabored resps, good aeration in all lung fields. ABD: soft, NT, ND, BS normal.  No hepatospenomegaly or mass.  No bruits. EXT: no clubbing, cyanosis, or edema.  Musculoskeletal: no joint swelling, erythema, warmth, or tenderness.  ROM of all joints intact. Skin - no sores or suspicious lesions or rashes or color changes Rectal exam: negative without mass, lesions or tenderness, PROSTATE EXAM: smooth and symmetric without nodules or tenderness.  Pertinent labs:  None today  ASSESSMENT AND PLAN:  Health maintenance exam: Reviewed age and gender appropriate health maintenance issues (prudent diet, regular exercise, health risks of tobacco and excessive alcohol, use of seatbelts, fire alarms in home, use of sunscreen).  Also reviewed age and gender appropriate health screening as well as vaccine recommendations. Vacc's UTD. Fasting HP labs drawn today. Prostate cancer screening: DRE normal, PSA drawn. Colon cancer screening: next colonoscopy due after 07/2017 ([name]). Encouraged him to arrange appt for eye exam. Encouraged TLC.  An After Visit Summary was printed and given to the patient.  FOLLOW UP:  Return in about 6 months (around 07/30/2016) for routine chronic illness f/u.  Signed:  Crissie Sickles, MD           01/29/2016

## 2016-01-29 NOTE — Progress Notes (Signed)
Pre visit review using our clinic review tool, if applicable. No additional management support is needed unless otherwise documented below in the visit note. 

## 2016-01-30 LAB — PSA: PSA: 0.52 ng/mL (ref <=4.00)

## 2016-03-20 ENCOUNTER — Ambulatory Visit (INDEPENDENT_AMBULATORY_CARE_PROVIDER_SITE_OTHER): Payer: Self-pay | Admitting: Emergency Medicine

## 2016-03-20 VITALS — BP 132/80 | HR 60 | Temp 98.1°F | Resp 15 | Ht 65.0 in | Wt 236.0 lb

## 2016-03-20 DIAGNOSIS — G4733 Obstructive sleep apnea (adult) (pediatric): Secondary | ICD-10-CM

## 2016-03-20 DIAGNOSIS — Z021 Encounter for pre-employment examination: Secondary | ICD-10-CM

## 2016-03-20 DIAGNOSIS — Z024 Encounter for examination for driving license: Secondary | ICD-10-CM

## 2016-03-20 NOTE — Progress Notes (Signed)
By signing my name below, I, Kevin Young, attest that this documentation has been prepared under the direction and in the presence of Kevin Queen, MD.  Electronically Signed: Verlee Young, Medical Scribe. 03/20/2016. 9:34 AM.  Chief Complaint:  Chief Complaint  Patient presents with  . Employment Physical    DOT    HPI: Kevin Young is a 60 y.o. male who reports to [facility] today for a DOT. Pt reports he has PMHx of HLD, IBS, and a deviated septum. Pt had a colectomy done for diverticulitis by [name]. Pt no longer takes oxycodone. Pt had pain in his groin about a week ago. Pt went to his surgeon for it and was placed on Lyrica. 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 Pt gets his prostate checked regularly. Pt denies sleep apnea. He had a sleep study 2 years ago with nl results. Pt hasn't had eye surgery.  Pt mentions he was rated a 12 from his sleep study, but never heard anything about his sleep apnea dx. Pt understands that he needs to get clearance from his pulmonologist to be cleared for his DOT.  Pt's wife had a heart transplant at [facility].  Past Medical History  Diagnosis Date  . Hyperlipidemia   . IBS (irritable bowel syndrome)   . Allergic rhinitis   . OSA (obstructive sleep apnea)   . Anxiety and depression   . Obesity, Class II, BMI 35-39.9   . Hepatic steatosis   . Nephrolithiasis 12/23/14    5mm nonobstructive R renal calc on CT abd/pelv done for diverticulitis  . History of adenomatous polyp of colon   . Diverticulitis large intestine 2015/16    Recurrent: GI MD [name].  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  [name] BPH (benign prostatic hyperplasia)     Nocturia is his primary symptom:  Takes Flomax  . Skin cancer     Basal cell removed from left hand  . Eustachian tube dysfunction     with recurrent "sinus issues"--ENT (Dr. [name]) recommended surgery for nasal turbinate reduction and septoplasty--as  of 11/03/15)   Past Surgical History  Procedure Laterality Date  . Tonsillectomy    . Colonoscopy  10/17/2002;  07/26/13    Hx of polyps.  Recall 07/2017 per [name]  . Hernia repair      multiple.  Right inguinal repair by Dr. Georganna Young in 2004.  [name] Esophagogastroduodenoscopy      need specifics from pt  . Laparoscopic sigmoid colectomy N/A 09/15/2015    Procedure: LAPAROSCOPIC ASSISTED  SIGMOID COLECTOMY;  Surgeon: [name], MD;  Location: [place];  Service: General;  Laterality: N/A;   Social History   Social History  . Marital Status: Married    Spouse Name: N/A  . Number of Children: N/A  . Years of Education: N/A   Occupational History  . truck driver    Social History Main Topics  . Smoking status: Former Smoker    Types: Cigars    Quit date: 12/02/2009  . Smokeless tobacco: Former Systems developer    Types: [place] date: 08/23/2013  . Alcohol Use: 3.0 oz/week    5 Cans of beer per week     Comment: 7-10 a week  . Drug Use: No  . Sexual Activity: Not Asked   Other Topics Concern  . None   Social History Narrative   Married, step children.   Orig  from [facility].   Occupation: delivery driver.   Former Facilities manager tob user, quit 2010.   Alcohol: 1 beer a day.   Family History  Problem Relation Age of Onset  . GI problems Mother   . Cancer Father     skin  . Hypertension Father   . Glaucoma Father   . Migraines Sister   . Seizures Brother   . Depression Brother    No Known Allergies Prior to Admission medications   Medication Sig Start Date End Date Taking? Authorizing Provider  pregabalin (LYRICA) 75 MG capsule Take 75 mg by mouth 2 (two) times daily.   Yes Historical Provider, MD  aspirin 81 MG tablet Take 81 mg by mouth daily.      Historical Provider, MD  atorvastatin (LIPITOR) 20 MG tablet Take 0.5 tablets (10 mg total) by mouth daily. 12/31/15   [name], MD  finasteride (PROSCAR) 5 MG tablet Take 1 tablet (5 mg total) by mouth  daily. Patient not taking: Reported on 03/20/2016 09/12/15   [name], MD  Melatonin 5 MG CAPS Take 2 capsules by mouth at bedtime.    Historical Provider, MD  montelukast (SINGULAIR) 10 MG tablet Take 1 tablet (10 mg total) by mouth at bedtime. 01/22/15   [name], MD  Multiple Vitamins-Minerals (MULTIVITAMIN WITH MINERALS) tablet Take 1 tablet by mouth daily.    Historical Provider, MD  naproxen sodium (ALEVE) 220 MG tablet Take 440 mg by mouth 2 (two) times daily as needed. Reported on 03/20/2016    Historical Provider, MD  Omega-3 Fatty Acids (FISH OIL) 1200 MG CAPS Take 3 capsules by mouth daily.     Historical Provider, MD  oxyCODONE (OXY IR/ROXICODONE) 5 MG immediate release tablet Take 1 tablet (5 mg total) by mouth every 4 (four) hours as needed (5mg  for mild to moderate pain, 10mg  for severe pain). Patient not taking: Reported on 03/20/2016 09/20/15   [name], MD  tamsulosin [facility]) 0.4 MG CAPS capsule 1-2 tabs po qhs Patient not taking: Reported on 03/20/2016 08/20/15   [name], MD     ROS: The patient denies fevers, chills, night sweats, unintentional weight loss, chest pain, palpitations, wheezing, dyspnea on exertion, nausea, vomiting, abdominal pain, dysuria, hematuria, melena, numbness, weakness, or tingling.  All other systems have been reviewed and were otherwise negative with the exception of those mentioned in the HPI and as above.    PHYSICAL EXAM: Filed Vitals:   03/20/16 0855  BP: 132/80  Pulse: 60  Temp: 98.1 F (36.7 C)  Resp: 15   Body mass index is 39.27 kg/(m^2).   General: Alert, no acute distress HEENT:  Normocephalic, atraumatic, oropharynx patent. Eye: [name] [facility] Cardiovascular:  Regular rate and rhythm, no rubs murmurs or gallops.  No Carotid bruits, radial pulse intact. No pedal edema.  Respiratory: Clear to auscultation bilaterally.  No wheezes, rales, or rhonchi.  No cyanosis, no use of accessory  musculature Abdominal: No organomegaly, abdomen is soft and non-tender, positive bowel sounds.  No masses. Musculoskeletal: Gait intact. No edema, tenderness. No hernia Skin: No rashes. Lower abdominal midline scar Neurologic: Facial musculature symmetric. Psychiatric: Patient acts appropriately throughout our interaction. Lymphatic: No cervical or submandibular lymphadenopathy  LABS:  EKG/XRAY:   Primary read interpreted by Dr. Everlene Farrier at [facility].   ASSESSMENT/PLAN: Patient needs reevaluation of his sleep apnea. He can return to clinic once he has his sleep apnea treated and he is cleared by pulmonary. Patient had  a total of 12   AHI  events per hour with transient desaturation to 79%.I personally performed the services described in this documentation, which was scribed in my presence. The recorded information has been reviewed and is accurate.   Gross sideeffects, risk and benefits, and alternatives of medications d/w patient. Patient is aware that all medications have potential sideeffects and we are unable to predict every sideeffect or drug-drug interaction that may occur.  Kevin Queen MD 03/20/2016 9:34 AM

## 2016-03-20 NOTE — Patient Instructions (Signed)
     IF you received an x-ray today, you will receive an invoice from [facility]. Please contact [facility] at [phone] with questions or concerns regarding your invoice.   IF you received labwork today, you will receive an invoice from [facility]. Please contact [facility] at [phone] with questions or concerns regarding your invoice.   Our billing staff will not be able to assist you with questions regarding bills from these companies.  You will be contacted with the lab results as soon as they are available. The fastest way to get your results is to activate your My Chart account. Instructions are located on the last page of this paperwork. If you have not heard from us regarding the results in 2 weeks, please contact this office.      

## 2016-04-11 ENCOUNTER — Encounter: Payer: Self-pay | Admitting: Family Medicine

## 2016-04-12 ENCOUNTER — Other Ambulatory Visit: Payer: Self-pay | Admitting: *Deleted

## 2016-04-12 MED ORDER — FINASTERIDE 5 MG PO TABS: 5.0000 mg | ORAL_TABLET | Freq: Every day | ORAL | Status: DC

## 2016-04-12 NOTE — Telephone Encounter (Signed)
RF request for finasteride LOV: 01/29/16 Next ov: 07/29/16 Last written: 09/12/15 #30 w/ 6RF

## 2016-04-20 ENCOUNTER — Ambulatory Visit (INDEPENDENT_AMBULATORY_CARE_PROVIDER_SITE_OTHER): Payer: Managed Care, Other (non HMO) | Admitting: Adult Health

## 2016-04-20 ENCOUNTER — Encounter: Payer: Self-pay | Admitting: Adult Health

## 2016-04-20 VITALS — BP 118/84 | HR 70 | Temp 98.4°F | Ht 65.0 in | Wt 241.0 lb

## 2016-04-20 DIAGNOSIS — G4733 Obstructive sleep apnea (adult) (pediatric): Secondary | ICD-10-CM

## 2016-04-20 NOTE — Patient Instructions (Signed)
Begin CPAP nightly Wear at least 4 hours nightly Work on weight loss Do not drive if your sleepy Download in 1 month after starting CPAP Follow up with Dr. Corrie Dandy in 2-3 months and as needed

## 2016-04-20 NOTE — Progress Notes (Signed)
Subjective:    Patient ID: Kevin Young, male    DOB: December 16, 1955, 60 y.o.   MRN: [id number]  HPI 60 yo male seen in past by Dr. Gwenette Greet for Mild OSA   TEST  Sleep study 06/2014 AHI 12/hr  04/20/2016 Follow up : Mild OSA  Pt returns for follow up . Seen in 2015 for sleep consult.  Underwent sleep study 06/2014  Found to have mild OSA. AHI 12/hr.  He opted to go with weight loss.  Recently seen for DOT physical, was told he would need to checked for OSA management.  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  We discussed previously sleep test. Weight is up 20lbs since 2015.  We discussed treatment options . He is okay with proceeding with CPAP .   Denies sleep issues, daytime sleepiness, no sleepiness driving.   Past Medical History  Diagnosis Date  . Hyperlipidemia   . IBS (irritable bowel syndrome)   . Allergic rhinitis   . OSA (obstructive sleep apnea)   . Anxiety and depression   . Obesity, Class II, BMI 35-39.9   . Hepatic steatosis   . Nephrolithiasis 12/23/14    83mm nonobstructive R renal calc on CT abd/pelv done for diverticulitis  . History of adenomatous polyp of colon   . Diverticulitis large intestine 2015/16    Recurrent: GI MD [name].  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  [name] BPH (benign prostatic hyperplasia)     Nocturia is his primary symptom:  Takes Flomax  . Skin cancer     Basal cell removed from left hand  . Eustachian tube dysfunction     with recurrent "sinus issues"--ENT (Dr. [name]) recommended surgery for nasal turbinate reduction and septoplasty--as of 11/03/15)  . GERD (gastroesophageal reflux disease)    Current Outpatient Prescriptions on File Prior to Visit  Medication Sig Dispense Refill  . aspirin 81 MG tablet Take 81 mg by mouth daily.      [name] atorvastatin (LIPITOR) 20 MG tablet Take 0.5 tablets (10 mg total) by mouth daily. 45 tablet 3  . Melatonin 5 MG CAPS Take 2 capsules by  mouth at bedtime. Reported on 04/20/2016    . montelukast (SINGULAIR) 10 MG tablet Take 1 tablet (10 mg total) by mouth at bedtime. 90 tablet 3  . Multiple Vitamins-Minerals (MULTIVITAMIN WITH MINERALS) tablet Take 1 tablet by mouth daily.    . Omega-3 Fatty Acids (FISH OIL) 1200 MG CAPS Take 3 capsules by mouth daily.     . pregabalin (LYRICA) 75 MG capsule Take 75 mg by mouth 2 (two) times daily.    . finasteride (PROSCAR) 5 MG tablet Take 1 tablet (5 mg total) by mouth daily. (Patient not taking: Reported on 04/20/2016) 30 tablet 6  . naproxen sodium (ALEVE) 220 MG tablet Take 440 mg by mouth 2 (two) times daily as needed. Reported on 04/20/2016    . oxyCODONE (OXY IR/ROXICODONE) 5 MG immediate release tablet Take 1 tablet (5 mg total) by mouth every 4 (four) hours as needed (5mg  for mild to moderate pain, 10mg  for severe pain). (Patient not taking: Reported on 04/20/2016) 40 tablet 0  . tamsulosin (FLOMAX) 0.4 MG CAPS capsule 1-2 tabs po qhs (Patient not taking: Reported on 03/20/2016) 60 capsule 6   No current facility-administered medications on file prior to visit.      Review of Systems Constitutional:   No  weight loss, night sweats,  Fevers, chills, fatigue, or  lassitude.  HEENT:   No headaches,  Difficulty swallowing,  Tooth/dental problems, or  Sore throat,                No sneezing, itching, ear ache, nasal congestion, post nasal drip,   CV:  No chest pain,  Orthopnea, PND, swelling in lower extremities, anasarca, dizziness, palpitations, syncope.   GI  No heartburn, indigestion, abdominal pain, nausea, vomiting, diarrhea, change in bowel habits, loss of appetite, bloody stools.   Resp: No shortness of breath with exertion or at rest.  No excess mucus, no productive cough,  No non-productive cough,  No coughing up of blood.  No change in color of mucus.  No wheezing.  No chest wall deformity  Skin: no rash or lesions.  GU: no dysuria, change in color of urine, no urgency or  frequency.  No flank pain, no hematuria   MS:  No joint pain or swelling.  No decreased range of motion.  No back pain.  Psych:  No change in mood or affect. No depression or anxiety.  No memory loss.         Objective:   Physical Exam Filed Vitals:   04/20/16 0911  BP: 118/84  Pulse: 70  Temp: 98.4 F (36.9 C)  TempSrc: Oral  Height: 5\' 5"  (1.651 m)  Weight: 241 lb (109.317 kg)  SpO2: 95%  Body mass index is 40.1 kg/(m^2).   GEN: A/Ox3; pleasant , NAD, obese   HEENT:  /AT,  EACs-clear, TMs-wnl, NOSE-clear, THROAT-clear, no lesions, no postnasal drip or exudate noted. Class 3 MP airway   NECK:  Supple w/ fair ROM; no JVD; normal carotid impulses w/o bruits; no thyromegaly or nodules palpated; no lymphadenopathy.  RESP  Clear  P & A; w/o, wheezes/ rales/ or rhonchi.no accessory muscle use, no dullness to percussion  CARD:  RRR, no m/r/g  , no peripheral edema, pulses intact, no cyanosis or clubbing.  GI:   Soft & nt; nml bowel sounds; no organomegaly or masses detected.  Musco: Warm bil, no deformities or joint swelling noted.   Neuro: alert, no focal deficits noted.    Skin: Warm, no lesions or rashes  Nazair Fortenberry NP-C  [facility]  04/20/2016        Assessment & Plan:

## 2016-04-20 NOTE — Assessment & Plan Note (Signed)
Mild OSA  Discussed treatment option and healthy lifestyle /sleep regimen   Plan  Begin CPAP nightly Wear at least 4 hours nightly Work on weight loss Do not drive if your sleepy Download in 1 month after starting CPAP Follow up with Kevin Young in 2-3 months and as needed

## 2016-05-06 ENCOUNTER — Telehealth: Payer: Self-pay

## 2016-05-06 NOTE — Telephone Encounter (Signed)
Please advise 

## 2016-05-06 NOTE — Telephone Encounter (Signed)
Patient stated he was seen at [facility]. The doctor placed him on a CPAP machine. Patient request for Dr. Everlene Farrier to look at the office notes from [facility] and to sign off on his DOT card. [phone]

## 2016-05-07 ENCOUNTER — Telehealth: Payer: Self-pay | Admitting: Emergency Medicine

## 2016-05-07 NOTE — Telephone Encounter (Signed)
Spoke with [t, advised him of message. He wants to know if he could get another temporary card because he has to have it by August 18th. Can this be done?

## 2016-05-07 NOTE — Telephone Encounter (Signed)
I will give the patient a 6 week DOT card. When studied he only had about 12 AHI  Per hour. He is currently on CPAP He has a follow-up visit with the sleep specialist. I gave him a 6 week DOT card so he can go ahead and drive and then follow-up with me. He will need to bring in his a1 month report of compliance.

## 2016-05-07 NOTE — Telephone Encounter (Signed)
The patient must be on CPAP for at least 1 month  And  show 80% compliance of at least 4 hours and then the DOT card can be issued.please call and advise the patient.

## 2016-05-07 NOTE — Telephone Encounter (Signed)
Patient has been to sleep specialist. He is now using CPAP. Reevaluation is to be in 4-6 weeks. I gave him a 6 week card area I told him he would need to bring in his compliance record in 4 weeks for me to review and then I can extend his card.

## 2016-05-07 NOTE — Telephone Encounter (Signed)
Card in pick up draw.

## 2016-05-14 NOTE — Telephone Encounter (Signed)
Left message for pt to call back  °

## 2016-06-03 ENCOUNTER — Institutional Professional Consult (permissible substitution): Payer: Managed Care, Other (non HMO) | Admitting: Pulmonary Disease

## 2016-06-24 ENCOUNTER — Ambulatory Visit: Payer: Managed Care, Other (non HMO) | Admitting: Pulmonary Disease

## 2016-06-25 ENCOUNTER — Other Ambulatory Visit: Payer: Self-pay | Admitting: Family Medicine

## 2016-06-25 DIAGNOSIS — J301 Allergic rhinitis due to pollen: Secondary | ICD-10-CM

## 2016-06-25 MED ORDER — MONTELUKAST SODIUM 10 MG PO TABS: 10.0000 mg | ORAL_TABLET | Freq: Every day | ORAL | 0 refills | Status: DC

## 2016-07-29 ENCOUNTER — Ambulatory Visit: Payer: Managed Care, Other (non HMO) | Admitting: Family Medicine

## 2016-08-13 ENCOUNTER — Encounter: Payer: Self-pay | Admitting: Family Medicine

## 2016-08-13 ENCOUNTER — Ambulatory Visit (INDEPENDENT_AMBULATORY_CARE_PROVIDER_SITE_OTHER): Payer: Managed Care, Other (non HMO) | Admitting: Family Medicine

## 2016-08-13 VITALS — BP 128/87 | HR 55 | Temp 98.4°F | Resp 16 | Wt 241.0 lb

## 2016-08-13 DIAGNOSIS — K219 Gastro-esophageal reflux disease without esophagitis: Secondary | ICD-10-CM

## 2016-08-13 DIAGNOSIS — E6609 Other obesity due to excess calories: Secondary | ICD-10-CM

## 2016-08-13 DIAGNOSIS — M792 Neuralgia and neuritis, unspecified: Secondary | ICD-10-CM

## 2016-08-13 DIAGNOSIS — Z0000 Encounter for general adult medical examination without abnormal findings: Secondary | ICD-10-CM

## 2016-08-13 DIAGNOSIS — E7800 Pure hypercholesterolemia, unspecified: Secondary | ICD-10-CM | POA: Diagnosis not present

## 2016-08-13 MED ORDER — PREGABALIN 75 MG PO CAPS: 75.0000 mg | ORAL_CAPSULE | Freq: Two times a day (BID) | ORAL | 5 refills | Status: DC

## 2016-08-13 NOTE — Progress Notes (Signed)
OFFICE VISIT  08/13/2016   CC:  Chief Complaint  Patient presents with  . Follow-up    Discuss medications   HPI:    Patient is a 60 y.o. Caucasian male who presents for 6 mo f/u hyperlipidemia, GERD, and obesity. He is not fasting today.  Was doing ok with walking regimen but with colder weather he has slacked off. Says he is trying to work on better food choices but stress in his life is working against him right now.  Has still been having intermittent groin pain, went back to [name], his surgeon, and he was put on lyrica for inflammation of "inguinal nerve".  Lyrica 75mg  bid was helping.  He ran out of this med recently and is in favor of continuing it.  Pantoprazole for GERD daily and this is helpful. Taking 1/2 of 20mg  atorv qd, no side effects.  All health panel labs normal 6 mo ago.  Past Medical History:  Diagnosis Date  . Allergic rhinitis   . Anxiety and depression   . BPH (benign prostatic hyperplasia)    Nocturia is his primary symptom:  Takes Flomax  . Diverticulitis large intestine 2015/16   Recurrent: GI MD [name].  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  . Eustachian tube dysfunction    with recurrent "sinus issues"--ENT (Dr. [name]) recommended surgery for nasal turbinate reduction and septoplasty--as of 11/03/15)  . GERD (gastroesophageal reflux disease)   . Hepatic steatosis   . History of adenomatous polyp of colon   . Hyperlipidemia   . IBS (irritable bowel syndrome)   . Nephrolithiasis 12/23/14   34mm nonobstructive R renal calc on CT abd/pelv done for diverticulitis  . Obesity, Class II, BMI 35-39.9   . OSA (obstructive sleep apnea)   . Skin cancer    Basal cell removed from left hand    Past Surgical History:  Procedure Laterality Date  . COLONOSCOPY  10/17/2002;  07/26/13   Hx of polyps.  Recall 07/2017 per [name]  . ESOPHAGOGASTRODUODENOSCOPY     need specifics from pt  . HERNIA REPAIR      multiple.  Right inguinal repair by Dr. [name] in 2004.  [name] LAPAROSCOPIC SIGMOID COLECTOMY N/A 09/15/2015   Procedure: LAPAROSCOPIC ASSISTED  SIGMOID COLECTOMY;  Surgeon: [name], MD;  Location: [place];  Service: General;  Laterality: N/A;  . TONSILLECTOMY      Outpatient Medications Prior to Visit  Medication Sig Dispense Refill  . aspirin 81 MG tablet Take 81 mg by mouth daily.      [name] atorvastatin (LIPITOR) 20 MG tablet Take 0.5 tablets (10 mg total) by mouth daily. 45 tablet 3  . Melatonin 5 MG CAPS Take 2 capsules by mouth at bedtime. Reported on 04/20/2016    . montelukast (SINGULAIR) 10 MG tablet Take 1 tablet (10 mg total) by mouth at bedtime. 90 tablet 0  . Multiple Vitamins-Minerals (MULTIVITAMIN WITH MINERALS) tablet Take 1 tablet by mouth daily.    . naproxen sodium (ALEVE) 220 MG tablet Take 440 mg by mouth 2 (two) times daily as needed. Reported on 04/20/2016    . Omega-3 Fatty Acids (FISH OIL) 1200 MG CAPS Take 3 capsules by mouth daily.     . pantoprazole (PROTONIX) 40 MG tablet Take 1 tablet by mouth daily before breakfast.  6  . pregabalin (LYRICA) 75 MG capsule Take 75 mg by mouth 2 (two) times daily.    [name]  finasteride (PROSCAR) 5 MG tablet Take 1 tablet (5 mg total) by mouth daily. (Patient not taking: Reported on 08/13/2016) 30 tablet 6  . tamsulosin (FLOMAX) 0.4 MG CAPS capsule 1-2 tabs po qhs (Patient not taking: Reported on 08/13/2016) 60 capsule 6  . oxyCODONE (OXY IR/ROXICODONE) 5 MG immediate release tablet Take 1 tablet (5 mg total) by mouth every 4 (four) hours as needed (5mg  for mild to moderate pain, 10mg  for severe pain). (Patient not taking: Reported on 04/20/2016) 40 tablet 0   No facility-administered medications prior to visit.     No Known Allergies  ROS As per HPI  PE: Blood pressure 128/87, pulse (!) 55, temperature 98.4 F (36.9 C), temperature source Temporal, resp. rate 16, weight 241 lb (109.3 kg), SpO2 95 %. Gen: Alert, well  appearing.  Patient is oriented to person, place, time, and situation. CV: RRR, no m/r/g.   LUNGS: CTA bilat, nonlabored resps, good aeration in all lung fields.   LABS:  Lab Results  Component Value Date   TSH 1.09 01/29/2016   Lab Results  Component Value Date   WBC 5.5 01/29/2016   HGB 15.6 01/29/2016   HCT 46.1 01/29/2016   MCV 89.9 01/29/2016   PLT 199 01/29/2016   Lab Results  Component Value Date   CREATININE 0.96 01/29/2016   BUN 14 01/29/2016   NA 139 01/29/2016   K 4.4 01/29/2016   CL 103 01/29/2016   CO2 25 01/29/2016   Lab Results  Component Value Date   ALT 34 01/29/2016   AST 29 01/29/2016   ALKPHOS 50 01/29/2016   BILITOT 0.6 01/29/2016   Lab Results  Component Value Date   CHOL 143 01/29/2016   Lab Results  Component Value Date   HDL 57 01/29/2016   Lab Results  Component Value Date   LDLCALC 75 01/29/2016   Lab Results  Component Value Date   TRIG 57 01/29/2016   Lab Results  Component Value Date   CHOLHDL 2.5 01/29/2016   Lab Results  Component Value Date   PSA 0.52 01/29/2016   PSA 0.49 03/13/2015   PSA 0.58 01/16/2014   Lab Results  Component Value Date   HGBA1C 6.0 11/18/2006   IMPRESSION AND PLAN:  1) Neuropathic groin pain: as per his surgeon.  Lyrica seems to have been effective at dosing of 75mg  bid so I'll re-order this today. We may ween him off this in 6 mo to see how he does.  2) GERD: doing well on daily PPI at this time.    3) Hyperlipidemia: tolerating statin.  AST/ALT normal 6 mo ago. Repeat these labs at next f/u in 6 mo.  4) Obesity: he needs push the issue more with TLC but at the current time says he is just too stressed out with taking care of his sick wife. He may try to tackle a dietary plan and start walking regularly again after 10/2016.  5) Preventative health care: Zostavax rx given to patient today.  An After Visit Summary was printed and given to the patient.  FOLLOW UP: Return in about 6  months (around 02/10/2017) for annual CPE (fasting).  Signed:  Crissie Sickles, MD           08/13/2016

## 2017-01-07 ENCOUNTER — Other Ambulatory Visit: Payer: Self-pay | Admitting: *Deleted

## 2017-01-07 MED ORDER — ATORVASTATIN CALCIUM 20 MG PO TABS: 10.0000 mg | ORAL_TABLET | Freq: Every day | ORAL | 0 refills | Status: DC

## 2017-01-07 NOTE — Telephone Encounter (Signed)
[facility].  RF request for atorvastatin LOV: 08/13/16 Next ov: 02/10/17 Last written: 12/31/15 #45 w/ 3RF

## 2017-02-10 ENCOUNTER — Encounter: Payer: Self-pay | Admitting: Family Medicine

## 2017-02-10 ENCOUNTER — Ambulatory Visit (INDEPENDENT_AMBULATORY_CARE_PROVIDER_SITE_OTHER): Payer: BLUE CROSS/BLUE SHIELD | Admitting: Family Medicine

## 2017-02-10 ENCOUNTER — Encounter: Payer: Managed Care, Other (non HMO) | Admitting: Family Medicine

## 2017-02-10 VITALS — BP 148/83 | HR 59 | Temp 98.0°F | Resp 16 | Ht 65.0 in | Wt 230.5 lb

## 2017-02-10 DIAGNOSIS — Z125 Encounter for screening for malignant neoplasm of prostate: Secondary | ICD-10-CM | POA: Diagnosis not present

## 2017-02-10 DIAGNOSIS — Z0000 Encounter for general adult medical examination without abnormal findings: Secondary | ICD-10-CM

## 2017-02-10 DIAGNOSIS — Z9189 Other specified personal risk factors, not elsewhere classified: Secondary | ICD-10-CM

## 2017-02-10 DIAGNOSIS — Z1159 Encounter for screening for other viral diseases: Secondary | ICD-10-CM

## 2017-02-10 DIAGNOSIS — E8881 Metabolic syndrome: Secondary | ICD-10-CM

## 2017-02-10 LAB — COMPREHENSIVE METABOLIC PANEL
ALBUMIN: 4.3 g/dL (ref 3.5–5.2)
ALT: 33 U/L (ref 0–53)
AST: 27 U/L (ref 0–37)
Alkaline Phosphatase: 59 U/L (ref 39–117)
BILIRUBIN TOTAL: 0.6 mg/dL (ref 0.2–1.2)
BUN: 16 mg/dL (ref 6–23)
CALCIUM: 9.4 mg/dL (ref 8.4–10.5)
CHLORIDE: 106 meq/L (ref 96–112)
CO2: 31 mEq/L (ref 19–32)
CREATININE: 1.05 mg/dL (ref 0.40–1.50)
GFR: 76.64 mL/min (ref >60.00)
Glucose, Bld: 109 mg/dL — ABNORMAL HIGH (ref 70–99)
Potassium: 4.6 mEq/L (ref 3.5–5.1)
Sodium: 142 mEq/L (ref 135–145)
Total Protein: 6.4 g/dL (ref 6.0–8.3)

## 2017-02-10 LAB — CBC WITH DIFFERENTIAL/PLATELET
BASOS ABS: 0 10*3/uL (ref 0.0–0.1)
Basophils Relative: 0.5 % (ref 0.0–3.0)
EOS PCT: 3.2 % (ref 0.0–5.0)
Eosinophils Absolute: 0.2 10*3/uL (ref 0.0–0.7)
HCT: 45.2 % (ref 39.0–52.0)
Hemoglobin: 15.3 g/dL (ref 13.0–17.0)
LYMPHS ABS: 2 10*3/uL (ref 0.7–4.0)
Lymphocytes Relative: 32.7 % (ref 12.0–46.0)
MCHC: 34 g/dL (ref 30.0–36.0)
MCV: 91.2 fl (ref 78.0–100.0)
MONOS PCT: 11.1 % (ref 3.0–12.0)
Monocytes Absolute: 0.7 10*3/uL (ref 0.1–1.0)
NEUTROS PCT: 52.5 % (ref 43.0–77.0)
Neutro Abs: 3.1 10*3/uL (ref 1.4–7.7)
Platelets: 186 10*3/uL (ref 150.0–400.0)
RBC: 4.95 Mil/uL (ref 4.22–5.81)
RDW: 13.6 % (ref 11.5–15.5)
WBC: 6 10*3/uL (ref 4.0–10.5)

## 2017-02-10 LAB — LIPID PANEL
CHOLESTEROL: 156 mg/dL (ref 0–200)
HDL: 59.5 mg/dL (ref >39.00)
LDL CALC: 84 mg/dL (ref 0–99)
NonHDL: 96.78
TRIGLYCERIDES: 63 mg/dL (ref 0.0–149.0)
Total CHOL/HDL Ratio: 3
VLDL: 12.6 mg/dL (ref 0.0–40.0)

## 2017-02-10 LAB — HEMOGLOBIN A1C: Hgb A1c MFr Bld: 6 % (ref 4.6–6.5)

## 2017-02-10 LAB — TSH: TSH: 1.57 u[IU]/mL (ref 0.35–4.50)

## 2017-02-10 LAB — PSA: PSA: 0.72 ng/mL (ref 0.10–4.00)

## 2017-02-10 NOTE — Patient Instructions (Signed)
 Health Maintenance, Male A healthy lifestyle and preventive care is important for your health and wellness. Ask your health care provider about what schedule of regular examinations is right for you. What should I know about weight and diet?  Eat a Healthy Diet  Eat plenty of vegetables, fruits, whole grains, low-fat dairy products, and lean protein.  Do not eat a lot of foods high in solid fats, added sugars, or salt. Maintain a Healthy Weight  Regular exercise can help you achieve or maintain a healthy weight. You should:  Do at least 150 minutes of exercise each week. The exercise should increase your heart rate and make you sweat (moderate-intensity exercise).  Do strength-training exercises at least twice a week. Watch Your Levels of Cholesterol and Blood Lipids  Have your blood tested for lipids and cholesterol every 5 years starting at 60 years of age. If you are at high risk for heart disease, you should start having your blood tested when you are 60 years old. You may need to have your cholesterol levels checked more often if:  Your lipid or cholesterol levels are high.  You are older than 60 years of age.  You are at high risk for heart disease. What should I know about cancer screening? Many types of cancers can be detected early and may often be prevented. Lung Cancer  You should be screened every year for lung cancer if:  You are a current smoker who has smoked for at least 30 years.  You are a former smoker who has quit within the past 15 years.  Talk to your health care provider about your screening options, when you should start screening, and how often you should be screened. Colorectal Cancer  Routine colorectal cancer screening usually begins at 60 years of age and should be repeated every 5-10 years until you are 60 years old. You may need to be screened more often if early forms of precancerous polyps or small growths are found. Your health care provider  may recommend screening at an earlier age if you have risk factors for colon cancer.  Your health care provider may recommend using home test kits to check for hidden blood in the stool.  A small camera at the end of a tube can be used to examine your colon (sigmoidoscopy or colonoscopy). This checks for the earliest forms of colorectal cancer. Prostate and Testicular Cancer  Depending on your age and overall health, your health care provider may do certain tests to screen for prostate and testicular cancer.  Talk to your health care provider about any symptoms or concerns you have about testicular or prostate cancer. Skin Cancer  Check your skin from head to toe regularly.  Tell your health care provider about any new moles or changes in moles, especially if:  There is a change in a mole's size, shape, or color.  You have a mole that is larger than a pencil eraser.  Always use sunscreen. Apply sunscreen liberally and repeat throughout the day.  Protect yourself by wearing long sleeves, pants, a wide-brimmed hat, and sunglasses when outside. What should I know about heart disease, diabetes, and high blood pressure?  If you are 18-39 years of age, have your blood pressure checked every 3-5 years. If you are 40 years of age or older, have your blood pressure checked every year. You should have your blood pressure measured twice-once when you are at a hospital or clinic, and once when you are not at   a hospital or clinic. Record the average of the two measurements. To check your blood pressure when you are not at a hospital or clinic, you can use:  An automated blood pressure machine at a pharmacy.  A home blood pressure monitor.  Talk to your health care provider about your target blood pressure.  If you are between 45-79 years old, ask your health care provider if you should take aspirin to prevent heart disease.  Have regular diabetes screenings by checking your fasting blood sugar  level.  If you are at a normal weight and have a low risk for diabetes, have this test once every three years after the age of 45.  If you are overweight and have a high risk for diabetes, consider being tested at a younger age or more often.  A one-time screening for abdominal aortic aneurysm (AAA) by ultrasound is recommended for men aged 65-75 years who are current or former smokers. What should I know about preventing infection? Hepatitis B  If you have a higher risk for hepatitis B, you should be screened for this virus. Talk with your health care provider to find out if you are at risk for hepatitis B infection. Hepatitis C  Blood testing is recommended for:  Everyone born from 1945 through 1965.  Anyone with known risk factors for hepatitis C. Sexually Transmitted Diseases (STDs)  You should be screened each year for STDs including gonorrhea and chlamydia if:  You are sexually active and are younger than 60 years of age.  You are older than 60 years of age and your health care provider tells you that you are at risk for this type of infection.  Your sexual activity has changed since you were last screened and you are at an increased risk for chlamydia or gonorrhea. Ask your health care provider if you are at risk.  Talk with your health care provider about whether you are at high risk of being infected with HIV. Your health care provider may recommend a prescription medicine to help prevent HIV infection. What else can I do?  Schedule regular health, dental, and eye exams.  Stay current with your vaccines (immunizations).  Do not use any tobacco products, such as cigarettes, chewing tobacco, and e-cigarettes. If you need help quitting, ask your health care provider.  Limit alcohol intake to no more than 2 drinks per day. One drink equals 12 ounces of beer, 5 ounces of wine, or 1 ounces of hard liquor.  Do not use street drugs.  Do not share needles.  Ask your health  care provider for help if you need support or information about quitting drugs.  Tell your health care provider if you often feel depressed.  Tell your health care provider if you have ever been abused or do not feel safe at home. This information is not intended to replace advice given to you by your health care provider. Make sure you discuss any questions you have with your health care provider. Document Released: 03/18/2008 Document Revised: 05/19/2016 Document Reviewed: 06/24/2015 Elsevier Interactive Patient Education  2017 Elsevier Inc.  

## 2017-02-10 NOTE — Progress Notes (Signed)
Office Note 02/10/2017  CC:  Chief Complaint  Patient presents with  . Annual Exam    Pt is not fasting.     HPI:  Kevin Young is a 61 y.o. White male who is here for annual health maintenance exam.  Eye exam: he is due and will set this up. Dental preventatives UTD.  Working on walking more. Diet: not working on anything right now.   Past Medical History:  Diagnosis Date  . Allergic rhinitis   . Anxiety and depression   . BPH (benign prostatic hyperplasia)    Nocturia is his primary symptom:  Takes Flomax  . Diverticulitis large intestine 2015/16   Recurrent: GI MD [name].  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  . Eustachian tube dysfunction    with recurrent "sinus issues"--ENT (Dr. [name]) recommended surgery for nasal turbinate reduction and septoplasty--as of 11/03/15)  . GERD (gastroesophageal reflux disease)   . Hepatic steatosis   . History of adenomatous polyp of colon   . Hyperlipidemia   . IBS (irritable bowel syndrome)   . Nephrolithiasis 12/23/14   104mm nonobstructive R renal calc on CT abd/pelv done for diverticulitis  . Obesity, Class II, BMI 35-39.9   . OSA (obstructive sleep apnea)   . Skin cancer    Basal cell removed from left hand    Past Surgical History:  Procedure Laterality Date  . COLONOSCOPY  10/17/2002;  07/26/13   Hx of polyps.  Recall 07/2017 per [name]  . ESOPHAGOGASTRODUODENOSCOPY     need specifics from pt  . HERNIA REPAIR     multiple.  Right inguinal repair by Dr. [name] in 2004.  [name] LAPAROSCOPIC SIGMOID COLECTOMY N/A 09/15/2015   Procedure: LAPAROSCOPIC ASSISTED  SIGMOID COLECTOMY;  Surgeon: [name], MD;  Location: [facility];  Service: General;  Laterality: N/A;  . TONSILLECTOMY      Family History  Problem Relation Age of Onset  . GI problems Mother   . Cancer Father        skin  . Hypertension Father   . Glaucoma Father   . Migraines Sister   .  Seizures Brother   . Depression Brother     Social History   Social History  . Marital status: Married    Spouse name: N/A  . Number of children: N/A  . Years of education: N/A   Occupational History  . truck driver [employer] History Main Topics  . Smoking status: Former Smoker    Types: Cigars    Quit date: 12/02/2009  . Smokeless tobacco: Former Systems developer    Types: [place] date: 08/23/2013  . Alcohol use 3.0 oz/week    5 Cans of beer per week     Comment: 7-10 a week  . Drug use: No  . Sexual activity: Not on file   Other Topics Concern  . Not on file   Social History Narrative   Married, step children.   Orig from [employer] [facility]   Occupation: delivery driver.   Former Facilities manager tob user, quit 2010.   Alcohol: 1 beer a day.    Outpatient Medications Prior to Visit  Medication Sig Dispense Refill  . aspirin 81 MG tablet Take 81 mg by mouth daily.      [name] atorvastatin (LIPITOR) 20 MG tablet Take 0.5 tablets (10 mg total) by mouth daily. 45 tablet 0  . Melatonin  5 MG CAPS Take 2 capsules by mouth at bedtime. Reported on 04/20/2016    . Multiple Vitamins-Minerals (MULTIVITAMIN WITH MINERALS) tablet Take 1 tablet by mouth daily.    . naproxen sodium (ALEVE) 220 MG tablet Take 440 mg by mouth 2 (two) times daily as needed. Reported on 04/20/2016    . Omega-3 Fatty Acids (FISH OIL) 1200 MG CAPS Take 3 capsules by mouth daily.     . pantoprazole (PROTONIX) 40 MG tablet Take 1 tablet by mouth daily before breakfast.  6  . finasteride (PROSCAR) 5 MG tablet Take 1 tablet (5 mg total) by mouth daily. (Patient not taking: Reported on 08/13/2016) 30 tablet 6  . montelukast (SINGULAIR) 10 MG tablet Take 1 tablet (10 mg total) by mouth at bedtime. (Patient not taking: Reported on 02/10/2017) 90 tablet 0  . pregabalin (LYRICA) 75 MG capsule Take 1 capsule (75 mg total) by mouth 2 (two) times daily. (Patient not taking: Reported on 02/10/2017) 60 capsule 5  .  tamsulosin (FLOMAX) 0.4 MG CAPS capsule 1-2 tabs po qhs (Patient not taking: Reported on 08/13/2016) 60 capsule 6   No facility-administered medications prior to visit.     No Known Allergies  ROS Review of Systems  Constitutional: Negative for appetite change, chills, fatigue and fever.  HENT: Negative for congestion, dental problem, ear pain and sore throat.   Eyes: Negative for discharge, redness and visual disturbance.  Respiratory: Negative for cough, chest tightness, shortness of breath and wheezing.   Cardiovascular: Negative for chest pain, palpitations and leg swelling.  Gastrointestinal: Negative for abdominal pain, blood in stool, diarrhea, nausea and vomiting.  Genitourinary: Negative for difficulty urinating, dysuria, flank pain, frequency, hematuria and urgency.  Musculoskeletal: Negative for arthralgias, back pain, joint swelling, myalgias and neck stiffness.  Skin: Negative for pallor and rash.  Neurological: Positive for tremors (L hand tremor x 2 yrs at least.). Negative for dizziness, speech difficulty, weakness and headaches.  Hematological: Negative for adenopathy. Does not bruise/bleed easily.  Psychiatric/Behavioral: Negative for confusion and sleep disturbance. The patient is not nervous/anxious.     PE; Blood pressure (!) 148/83, pulse (!) 59, temperature 98 F (36.7 C), temperature source Oral, resp. rate 16, height 5\' 5"  (1.651 m), weight 230 lb 8 oz (104.6 kg), SpO2 95 %. Gen: Alert, well appearing.  Patient is oriented to person, place, time, and situation. AFFECT: pleasant, lucid thought and speech. ENT: Ears: EACs clear, normal epithelium.  TMs with good light reflex and landmarks bilaterally.  Eyes: no injection, icteris, swelling, or exudate.  EOMI, PERRLA. Nose: no drainage or turbinate edema/swelling.  No injection or focal lesion.  Mouth: lips without lesion/swelling.  Oral mucosa pink and moist.  Dentition intact and without obvious caries or gingival  swelling.  Oropharynx without erythema, exudate, or swelling.  Neck: supple/nontender.  No LAD, mass, or TM.  Carotid pulses 2+ bilaterally, without bruits. CV: RRR, no m/r/g.   LUNGS: CTA bilat, nonlabored resps, good aeration in all lung fields. ABD: soft, NT, ND, BS normal.  No hepatospenomegaly or mass.  No bruits. EXT: no clubbing, cyanosis, or edema.  Musculoskeletal: no joint swelling, erythema, warmth, or tenderness.  ROM of all joints intact. Skin - no sores or suspicious lesions or rashes or color changes Rectal exam: negative without mass, lesions or tenderness, PROSTATE EXAM: smooth and symmetric without nodules or tenderness. Neuro: CN 2-12 intact bilaterally, strength 5/5 in proximal and distal upper extremities and lower extremities bilaterally.  No sensory deficits. Fine  resting tremor with arms held outstretched.  No pill rolling.  No bradykinesia or rigidity.  Gait normal.  FNF normal bilat.  No ataxia.  Upper extremity and lower extremity DTRs symmetric.     Pertinent labs:  Lab Results  Component Value Date   TSH 1.09 01/29/2016   Lab Results  Component Value Date   WBC 5.5 01/29/2016   HGB 15.6 01/29/2016   HCT 46.1 01/29/2016   MCV 89.9 01/29/2016   PLT 199 01/29/2016   Lab Results  Component Value Date   CREATININE 0.96 01/29/2016   BUN 14 01/29/2016   NA 139 01/29/2016   K 4.4 01/29/2016   CL 103 01/29/2016   CO2 25 01/29/2016   Lab Results  Component Value Date   ALT 34 01/29/2016   AST 29 01/29/2016   ALKPHOS 50 01/29/2016   BILITOT 0.6 01/29/2016   Lab Results  Component Value Date   CHOL 143 01/29/2016   Lab Results  Component Value Date   HDL 57 01/29/2016   Lab Results  Component Value Date   LDLCALC 75 01/29/2016   Lab Results  Component Value Date   TRIG 57 01/29/2016   Lab Results  Component Value Date   CHOLHDL 2.5 01/29/2016   Lab Results  Component Value Date   PSA 0.52 01/29/2016   PSA 0.49 03/13/2015   PSA 0.58  01/16/2014   Lab Results  Component Value Date   HGBA1C 6.0 11/18/2006     ASSESSMENT AND PLAN:   1) Health maintenance exam: Reviewed age and gender appropriate health maintenance issues (prudent diet, regular exercise, health risks of tobacco and excessive alcohol, use of seatbelts, fire alarms in home, use of sunscreen).  Also reviewed age and gender appropriate health screening as well as vaccine recommendations. Vaccines UTD. Fasting HP (he had a donut about 4 hours ago). Prostate ca screening: DRE normal.  PSA done today. Colon ca screening: next colonoscopy due 07/2017 ([name]).  2) Tremor: benign essential tremor--not bothering him at this time so no treatment necessary.  An After Visit Summary was printed and given to the patient.  FOLLOW UP:  Return in about 6 months (around 08/13/2017) for routine chronic illness f/u.  Signed:  Crissie Sickles, MD           02/10/2017

## 2017-02-11 ENCOUNTER — Encounter: Payer: Self-pay | Admitting: Family Medicine

## 2017-02-11 LAB — HEPATITIS C ANTIBODY: HCV Ab: NEGATIVE

## 2017-02-15 ENCOUNTER — Telehealth: Payer: Self-pay | Admitting: Family Medicine

## 2017-02-15 NOTE — Telephone Encounter (Signed)
Patient's wife calling on behalf of pt to state during ov last week pcp was supposed to call in refills for:  atorvastatin (LIPITOR) 20 MG tablet  pantoprazole (PROTONIX) 40 MG tablet   Pharmacy: [facility], [place] - [address] [phone] (Phone) [phone] (Fax)

## 2017-02-16 MED ORDER — ATORVASTATIN CALCIUM 20 MG PO TABS: 10.0000 mg | ORAL_TABLET | Freq: Every day | ORAL | 3 refills | Status: DC

## 2017-02-16 MED ORDER — PANTOPRAZOLE SODIUM 40 MG PO TBEC: 40.0000 mg | DELAYED_RELEASE_TABLET | Freq: Every day | ORAL | 3 refills | Status: DC

## 2017-02-16 NOTE — Telephone Encounter (Signed)
RF request for atorvastatin LOV: 02/10/17 Next ov: 08/12/17 Last written: 01/07/17 #45 w/ 0RF  RF request for pantoprazole Last written: unknown

## 2017-02-18 NOTE — Telephone Encounter (Signed)
Left detailed message on home vm, okay per DPR.  

## 2017-02-22 ENCOUNTER — Other Ambulatory Visit: Payer: Self-pay | Admitting: Family Medicine

## 2017-06-03 ENCOUNTER — Encounter: Payer: Self-pay | Admitting: Family Medicine

## 2017-06-03 ENCOUNTER — Ambulatory Visit (INDEPENDENT_AMBULATORY_CARE_PROVIDER_SITE_OTHER): Payer: BLUE CROSS/BLUE SHIELD | Admitting: Family Medicine

## 2017-06-03 VITALS — BP 146/87 | HR 79 | Temp 98.0°F | Resp 16 | Wt 240.0 lb

## 2017-06-03 DIAGNOSIS — S76212A Strain of adductor muscle, fascia and tendon of left thigh, initial encounter: Secondary | ICD-10-CM | POA: Diagnosis not present

## 2017-06-03 NOTE — Progress Notes (Signed)
OFFICE VISIT  06/03/2017   CC:  Chief Complaint  Patient presents with  . Groin Pain    Left side   HPI:    Patient is a 61 y.o. Caucasian male who presents for left sided groin pain. Lifting heavy object 3 weeks ago felt acute pain in left groin area.  This has been mild intensity and has stayed constant, then he lifted something heavy again a few days ago and hurt the area again.  Has been applying ice some and taking some tylenol which helps a little. Started taking some lyrica he had leftover from treatment of inguinal nerve pain in the past. He does not see any bulge.  No hx of hernia on left side, but has head R sided hernia repair in remote past. When sitting it is a bit worse, not as bad when walking around.  Worse with lifting. Pain does not radiate into scrotal area/testicle.  It does extend some into LLQ abd. He is eating, drinking, and stooling normally.  Past Medical History:  Diagnosis Date  . Allergic rhinitis   . Anxiety and depression   . BPH (benign prostatic hyperplasia)    Nocturia is his primary symptom:  Takes Flomax  . Diverticulitis large intestine 2015/16   Recurrent: GI MD [name].  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  . Eustachian tube dysfunction    with recurrent "sinus issues"--ENT (Dr. [name]) recommended surgery for nasal turbinate reduction and septoplasty--as of 11/03/15)  . GERD (gastroesophageal reflux disease)   . Hepatic steatosis   . History of adenomatous polyp of colon   . Hyperlipidemia   . IBS (irritable bowel syndrome)   . Nephrolithiasis 12/23/14   26mm nonobstructive R renal calc on CT abd/pelv done for diverticulitis  . Obesity, Class II, BMI 35-39.9   . OSA (obstructive sleep apnea)   . Prediabetes    HbA1c 6.0% in 2008 and in 2018.  . Skin cancer    Basal cell removed from left hand    Past Surgical History:  Procedure Laterality Date  . COLONOSCOPY  10/17/2002;  07/26/13   Hx  of polyps.  Recall 07/2017 per [name]  . ESOPHAGOGASTRODUODENOSCOPY     need specifics from pt  . HERNIA REPAIR     multiple.  Right inguinal repair by Dr. [name] in 2004.  [name] LAPAROSCOPIC SIGMOID COLECTOMY N/A 09/15/2015   Procedure: LAPAROSCOPIC ASSISTED  SIGMOID COLECTOMY;  Surgeon: [name], MD;  Location: [facility];  Service: General;  Laterality: N/A;  . TONSILLECTOMY      Outpatient Medications Prior to Visit  Medication Sig Dispense Refill  . aspirin 81 MG tablet Take 81 mg by mouth daily.      [name] atorvastatin (LIPITOR) 20 MG tablet Take 0.5 tablets (10 mg total) by mouth daily. 45 tablet 3  . atorvastatin (LIPITOR) 20 MG tablet TAKE 0.5 TABLETS (10 MG TOTAL) BY MOUTH DAILY. 45 tablet 1  . cetirizine (ZYRTEC) 10 MG tablet Take 10 mg by mouth daily.    . Melatonin 5 MG CAPS Take 2 capsules by mouth at bedtime. Reported on 04/20/2016    . Multiple Vitamins-Minerals (MULTIVITAMIN WITH MINERALS) tablet Take 1 tablet by mouth daily.    . Omega-3 Fatty Acids (FISH OIL) 1200 MG CAPS Take 3 capsules by mouth daily.     [name] OVER THE COUNTER MEDICATION Super Beta prostate - takes one capsule twice daily    .  pantoprazole (PROTONIX) 40 MG tablet Take 1 tablet (40 mg total) by mouth daily before breakfast. 90 tablet 3  . naproxen sodium (ALEVE) 220 MG tablet Take 440 mg by mouth 2 (two) times daily as needed. Reported on 04/20/2016     No facility-administered medications prior to visit.     No Known Allergies  ROS As per HPI  PE: Blood pressure (!) 146/87, pulse 79, temperature 98 F (36.7 C), temperature source Oral, resp. rate 16, weight 240 lb (108.9 kg), SpO2 95 %. Gen: Alert, well appearing.  Patient is oriented to person, place, time, and situation. AFFECT: pleasant, lucid thought and speech. Left groin with mild pressure sensation when I palpate inguinal region upward towards abomen (patient has abundant suprapubic adipose tissue).  I cannot see or feel any bulging  tissue or mass in groin or scrotum when patient coughs forcefully when standing.  No difference in exam when patient is supine.  Mild left inguinal ligament area tenderness/discomfort to palpation. No abd TTP or distention.  LABS:  none  IMPRESSION AND PLAN:  Left groin strain.  No sign of inguinal hernia. Reassured pt. Instructions: Apply heat to the area of pain/discomfort for 30 min per day. Rest. Take TWO of the aleve 220 mg tabs TWICE per day until they are all gone (nine days-worth of samples given today).  An After Visit Summary was printed and given to the patient.  FOLLOW UP: Return if symptoms worsen or fail to improve.   Signed:  Crissie Sickles, MD           06/03/2017

## 2017-06-03 NOTE — Patient Instructions (Signed)
Apply heat to the area of pain/discomfort for 30 min per day. Rest. Take TWO of the aleve tabs TWICE per day until they are all gone.

## 2017-06-08 ENCOUNTER — Ambulatory Visit: Payer: BLUE CROSS/BLUE SHIELD | Admitting: Family Medicine

## 2017-07-22 ENCOUNTER — Encounter: Payer: Self-pay | Admitting: Family Medicine

## 2017-08-10 ENCOUNTER — Other Ambulatory Visit: Payer: Self-pay | Admitting: General Surgery

## 2017-08-10 ENCOUNTER — Encounter: Payer: Self-pay | Admitting: Family Medicine

## 2017-08-10 DIAGNOSIS — R1032 Left lower quadrant pain: Secondary | ICD-10-CM

## 2017-08-12 ENCOUNTER — Ambulatory Visit: Payer: BLUE CROSS/BLUE SHIELD | Admitting: Family Medicine

## 2017-08-24 HISTORY — PX: COLONOSCOPY: SHX174

## 2017-08-24 LAB — HM COLONOSCOPY

## 2017-08-31 ENCOUNTER — Encounter: Payer: Self-pay | Admitting: Family Medicine

## 2018-02-10 ENCOUNTER — Encounter: Payer: Self-pay | Admitting: Family Medicine

## 2018-02-10 DIAGNOSIS — R7303 Prediabetes: Secondary | ICD-10-CM | POA: Insufficient documentation

## 2018-02-13 ENCOUNTER — Encounter: Payer: Self-pay | Admitting: Family Medicine

## 2018-02-13 ENCOUNTER — Ambulatory Visit (INDEPENDENT_AMBULATORY_CARE_PROVIDER_SITE_OTHER): Admitting: Family Medicine

## 2018-02-13 VITALS — BP 146/94 | HR 72 | Temp 98.1°F | Resp 16 | Ht 65.0 in | Wt 231.0 lb

## 2018-02-13 DIAGNOSIS — Z125 Encounter for screening for malignant neoplasm of prostate: Secondary | ICD-10-CM

## 2018-02-13 DIAGNOSIS — R1032 Left lower quadrant pain: Secondary | ICD-10-CM

## 2018-02-13 DIAGNOSIS — I10 Essential (primary) hypertension: Secondary | ICD-10-CM

## 2018-02-13 DIAGNOSIS — Z0000 Encounter for general adult medical examination without abnormal findings: Secondary | ICD-10-CM | POA: Diagnosis not present

## 2018-02-13 DIAGNOSIS — R7303 Prediabetes: Secondary | ICD-10-CM | POA: Diagnosis not present

## 2018-02-13 MED ORDER — LISINOPRIL 5 MG PO TABS: 5.0000 mg | ORAL_TABLET | Freq: Every day | ORAL | 3 refills | Status: DC

## 2018-02-13 NOTE — Progress Notes (Addendum)
Office Note 02/13/2018  CC:  Chief Complaint  Patient presents with  . Annual Exam    HPI:  GOMER FRANCE is a 62 y.o. White male who is here for annual health maintenance exam.    BP: occ bp check out of the office for the last year--elevated to same degree as today.  Exercise: walks daily 2 miles the last 6 wks. Diet: getting a little better.  Wife on dialysis and has had CVAs--he stays home to take care of her now. Says things are getting better.    Still having groin pain (see old OV note for more details)--L groin, extends some into medial L thigh as well as around L side into L glut.  Worse sitting, better when up walking around. 08/2016: I saw him and at that time he was being treated with lyrica for presume neuropathic pain in L groin (gen surgeon rx'd this). 05/2017: I saw him and felt like the groin pain came after some activities that could have strained a muscle or ligament.   Past Medical History:  Diagnosis Date  . Allergic rhinitis   . Anxiety and depression   . BPH (benign prostatic hyperplasia)    Nocturia is his primary symptom:  Takes Flomax  . Diverticulitis large intestine 2015/16   Recurrent: GI MD [name].  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  . Eustachian tube dysfunction    with recurrent "sinus issues"--ENT (Dr. [name]) recommended surgery for nasal turbinate reduction and septoplasty--as of 11/03/15)  . GERD (gastroesophageal reflux disease)   . Hepatic steatosis   . History of adenomatous polyp of colon   . Hyperlipidemia   . IBS (irritable bowel syndrome)   . Inguinal pain, left    Chronic; [name] to do MRI pelvis w/out contrast as of 08/10/17.  . Nephrolithiasis 12/23/14   49mm nonobstructive R renal calc on CT abd/pelv done for diverticulitis  . Obesity, Class II, BMI 35-39.9   . OSA (obstructive sleep apnea)   . Prediabetes    HbA1c 6.0% in 2008 and in 2018.  . Skin cancer     Basal cell removed from left hand    Past Surgical History:  Procedure Laterality Date  . COLONOSCOPY  10/17/2002;  07/26/13   Hx of polyps.  Recall 07/2017 per [name]  . COLONOSCOPY  08/24/2017   Diverticulosis from transverse colon to sigmoid.  Otherwise normal.  Repeat in 5-10 years for screening/[name]  . ESOPHAGOGASTRODUODENOSCOPY     need specifics from pt  . HERNIA REPAIR     multiple.  Right inguinal repair by Dr. [name] in 2004.  07/2017 having some muscular/nerve pain in groin--evaluated by Dr. Alice Reichert w/u at that time.  [name] LAPAROSCOPIC SIGMOID COLECTOMY N/A 09/15/2015   Procedure: LAPAROSCOPIC ASSISTED  SIGMOID COLECTOMY;  Surgeon: [name], MD;  Location: [facility];  Service: General;  Laterality: N/A;  . TONSILLECTOMY      Family History  Problem Relation Age of Onset  . GI problems Mother   . Cancer Father        skin  . Hypertension Father   . Glaucoma Father   . Migraines Sister   . Seizures Brother   . Depression Brother     Social History   Socioeconomic History  . Marital status: Married    Spouse name: Not on file  . Number of children: Not on file  . Years of education:  Not on file  . Highest education level: Not on file  Occupational History  . Occupation: truck Education administrator: [employer]  . Financial resource strain: Not on file  . Food insecurity:    Worry: Not on file    Inability: Not on file  . Transportation needs:    Medical: Not on file    Non-medical: Not on file  Tobacco Use  . Smoking status: Former Smoker    Types: Cigars    Last attempt to quit: 12/02/2009    Years since quitting: 8.2  . Smokeless tobacco: Former Systems developer    Types: [place] date: 08/23/2013  Substance and Sexual Activity  . Alcohol use: Yes    Alcohol/week: 3.0 oz    Types: 5 Cans of beer per week    Comment: 7-10 a week  . Drug use: No  . Sexual activity: Not on file  Lifestyle  . Physical activity:     Days per week: Not on file    Minutes per session: Not on file  . Stress: Not on file  Relationships  . Social connections:    Talks on phone: Not on file    Gets together: Not on file    Attends religious service: Not on file    Active member of club or organization: Not on file    Attends meetings of clubs or organizations: Not on file    Relationship status: Not on file  . Intimate partner violence:    Fear of current or ex partner: Not on file    Emotionally abused: Not on file    Physically abused: Not on file    Forced sexual activity: Not on file  Other Topics Concern  . Not on file  Social History Narrative   Married, step children.   Orig from [facility]   Occupation: delivery driver.   Former Facilities manager tob user, quit 2010.   Alcohol: 1 beer a day.    Outpatient Medications Prior to Visit  Medication Sig Dispense Refill  . aspirin 81 MG tablet Take 81 mg by mouth daily.      [name] atorvastatin (LIPITOR) 20 MG tablet Take 0.5 tablets (10 mg total) by mouth daily. 45 tablet 3  . cetirizine (ZYRTEC) 10 MG tablet Take 10 mg by mouth daily.    . Melatonin 5 MG CAPS Take 2 capsules by mouth at bedtime. Reported on 04/20/2016    . Multiple Vitamins-Minerals (MULTIVITAMIN WITH MINERALS) tablet Take 1 tablet by mouth daily.    . naproxen sodium (ALEVE) 220 MG tablet Take 440 mg by mouth 2 (two) times daily as needed. Reported on 04/20/2016    . Omega-3 Fatty Acids (FISH OIL) 1200 MG CAPS Take 3 capsules by mouth daily.     [name] OVER THE COUNTER MEDICATION Super Beta prostate - takes one capsule twice daily    . pantoprazole (PROTONIX) 40 MG tablet Take 1 tablet (40 mg total) by mouth daily before breakfast. 90 tablet 3  . pregabalin (LYRICA) 75 MG capsule Take 75 mg by mouth 2 (two) times daily.    [name] atorvastatin (LIPITOR) 20 MG tablet TAKE 0.5 TABLETS (10 MG TOTAL) BY MOUTH DAILY. 45 tablet 1   No facility-administered medications prior to visit.     No Known  Allergies  ROS Review of Systems  Constitutional: Negative for appetite change, chills, fatigue and fever.  HENT: Negative for congestion, dental problem, ear pain and  sore throat.   Eyes: Negative for discharge, redness and visual disturbance.  Respiratory: Negative for cough, chest tightness, shortness of breath and wheezing.   Cardiovascular: Negative for chest pain, palpitations and leg swelling.  Gastrointestinal: Negative for abdominal pain, blood in stool, diarrhea, nausea and vomiting.  Genitourinary: Negative for difficulty urinating, dysuria, flank pain, frequency, hematuria and urgency.  Musculoskeletal: Positive for myalgias (groin--see HPI). Negative for arthralgias, back pain, joint swelling and neck stiffness.  Skin: Negative for pallor and rash.  Neurological: Negative for dizziness, speech difficulty, weakness and headaches.  Hematological: Negative for adenopathy. Does not bruise/bleed easily.  Psychiatric/Behavioral: Negative for confusion and sleep disturbance. The patient is not nervous/anxious.     PE; Blood pressure (!) 146/94, pulse 72, temperature 98.1 F (36.7 C), temperature source Oral, resp. rate 16, height 5\' 5"  (1.651 m), weight 231 lb (104.8 kg), SpO2 97 %. Gen: Alert, well appearing.  Patient is oriented to person, place, time, and situation. AFFECT: pleasant, lucid thought and speech. ENT: Ears: EACs clear, normal epithelium.  TMs with good light reflex and landmarks bilaterally.  Eyes: no injection, icteris, swelling, or exudate.  EOMI, PERRLA. Nose: no drainage or turbinate edema/swelling.  No injection or focal lesion.  Mouth: lips without lesion/swelling.  Oral mucosa pink and moist.  Dentition intact and without obvious caries or gingival swelling.  Oropharynx without erythema, exudate, or swelling.  Neck: supple/nontender.  No LAD, mass, or TM.  Carotid pulses 2+ bilaterally, without bruits. CV: RRR, no m/r/g.   LUNGS: CTA bilat, nonlabored resps,  good aeration in all lung fields. ABD: soft, NT, ND, BS normal.  No hepatospenomegaly or mass.  No bruits. EXT: no clubbing, cyanosis, or edema.   Left groin mildly TTP over inguinal ligament region.  No pain with resisted hip flexion/extension.  No side, glut, or thigh tenderness.  Mild pain with L hip IR. Musculoskeletal: no joint swelling, erythema, warmth, or tenderness.  ROM of all joints intact. Skin - no sores or suspicious lesions or rashes or color changes Rectal exam: negative without mass, lesions or tenderness, PROSTATE EXAM: smooth and symmetric without nodules or tenderness.    Pertinent labs:  Lab Results  Component Value Date   TSH 1.57 02/10/2017   Lab Results  Component Value Date   WBC 6.0 02/10/2017   HGB 15.3 02/10/2017   HCT 45.2 02/10/2017   MCV 91.2 02/10/2017   PLT 186.0 02/10/2017   Lab Results  Component Value Date   CREATININE 1.05 02/10/2017   BUN 16 02/10/2017   NA 142 02/10/2017   K 4.6 02/10/2017   CL 106 02/10/2017   CO2 31 02/10/2017   Lab Results  Component Value Date   ALT 33 02/10/2017   AST 27 02/10/2017   ALKPHOS 59 02/10/2017   BILITOT 0.6 02/10/2017   Lab Results  Component Value Date   CHOL 156 02/10/2017   Lab Results  Component Value Date   HDL 59.50 02/10/2017   Lab Results  Component Value Date   LDLCALC 84 02/10/2017   Lab Results  Component Value Date   TRIG 63.0 02/10/2017   Lab Results  Component Value Date   CHOLHDL 3 02/10/2017   Lab Results  Component Value Date   PSA 0.72 02/10/2017   PSA 0.52 01/29/2016   PSA 0.49 03/13/2015   Lab Results  Component Value Date   HGBA1C 6.0 02/10/2017     ASSESSMENT AND PLAN:   1) HTN: needs to start med--rx'd lisinopril 5mg   qd today.  2) Left groin pain--chronic: unclear etiology. Exams in the past unrevealing by myself and general surgeon. Surgeon had planned on getting MRI of the area but insurer denied this. Will check Left hip joint with plain  films--ordered today. Need to consider eval for possible neuropathy causing this pain (left L1 and L2 spinal nerve territory vs peripheral/pelvic nerve pain--MRI L spine?).  2) Health maintenance exam: Reviewed age and gender appropriate health maintenance issues (prudent diet, regular exercise, health risks of tobacco and excessive alcohol, use of seatbelts, fire alarms in home, use of sunscreen).  Also reviewed age and gender appropriate health screening as well as vaccine recommendations. Vaccines: he is all UTD, including shingrix series. Labs: fasting HP, PSA, HbA1c (prediabetes). Prostate ca screening:  DRE normal today , PSA. Colon ca screening: next colonoscopy due 2023-2028 time range ([name]).  An After Visit Summary was printed and given to the patient.  FOLLOW UP:  Return for 2-3 week f/u HTN/Groin pain: also fasting lab appt at pt's earliest convenience.  Signed:  Crissie Sickles, MD           02/13/2018

## 2018-03-04 DIAGNOSIS — M160 Bilateral primary osteoarthritis of hip: Secondary | ICD-10-CM

## 2018-03-04 HISTORY — DX: Bilateral primary osteoarthritis of hip: M16.0

## 2018-03-05 ENCOUNTER — Other Ambulatory Visit: Payer: Self-pay | Admitting: Family Medicine

## 2018-03-08 ENCOUNTER — Ambulatory Visit: Admitting: Family Medicine

## 2018-03-10 ENCOUNTER — Ambulatory Visit (HOSPITAL_BASED_OUTPATIENT_CLINIC_OR_DEPARTMENT_OTHER)
Admission: RE | Admit: 2018-03-10 | Discharge: 2018-03-10 | Disposition: A | Discharge status: Home / Self Care | Source: Ambulatory Visit | Attending: Family Medicine | Admitting: Family Medicine

## 2018-03-10 DIAGNOSIS — R1032 Left lower quadrant pain: Secondary | ICD-10-CM | POA: Insufficient documentation

## 2018-03-12 ENCOUNTER — Encounter: Payer: Self-pay | Admitting: Family Medicine

## 2018-03-15 ENCOUNTER — Encounter: Payer: Self-pay | Admitting: Family Medicine

## 2018-03-15 ENCOUNTER — Ambulatory Visit (INDEPENDENT_AMBULATORY_CARE_PROVIDER_SITE_OTHER): Admitting: Family Medicine

## 2018-03-15 VITALS — BP 130/73 | HR 68 | Temp 97.8°F | Resp 16 | Ht 65.0 in | Wt 233.2 lb

## 2018-03-15 DIAGNOSIS — Z0000 Encounter for general adult medical examination without abnormal findings: Secondary | ICD-10-CM | POA: Diagnosis not present

## 2018-03-15 DIAGNOSIS — R7303 Prediabetes: Secondary | ICD-10-CM

## 2018-03-15 DIAGNOSIS — R1032 Left lower quadrant pain: Secondary | ICD-10-CM

## 2018-03-15 DIAGNOSIS — E669 Obesity, unspecified: Secondary | ICD-10-CM

## 2018-03-15 DIAGNOSIS — G8929 Other chronic pain: Secondary | ICD-10-CM

## 2018-03-15 DIAGNOSIS — Z125 Encounter for screening for malignant neoplasm of prostate: Secondary | ICD-10-CM

## 2018-03-15 LAB — CBC WITH DIFFERENTIAL/PLATELET
BASOS ABS: 0 10*3/uL (ref 0.0–0.1)
Basophils Relative: 0.2 % (ref 0.0–3.0)
EOS ABS: 0.3 10*3/uL (ref 0.0–0.7)
Eosinophils Relative: 4.2 % (ref 0.0–5.0)
HEMATOCRIT: 44.2 % (ref 39.0–52.0)
HEMOGLOBIN: 15.2 g/dL (ref 13.0–17.0)
LYMPHS PCT: 27.6 % (ref 12.0–46.0)
Lymphs Abs: 2.3 10*3/uL (ref 0.7–4.0)
MCHC: 34.4 g/dL (ref 30.0–36.0)
MCV: 92 fl (ref 78.0–100.0)
Monocytes Absolute: 0.7 10*3/uL (ref 0.1–1.0)
Monocytes Relative: 9 % (ref 3.0–12.0)
Neutro Abs: 4.8 10*3/uL (ref 1.4–7.7)
Neutrophils Relative %: 59 % (ref 43.0–77.0)
PLATELETS: 197 10*3/uL (ref 150.0–400.0)
RBC: 4.81 Mil/uL (ref 4.22–5.81)
RDW: 13.3 % (ref 11.5–15.5)
WBC: 8.1 10*3/uL (ref 4.0–10.5)

## 2018-03-15 LAB — LIPID PANEL
CHOL/HDL RATIO: 3
CHOLESTEROL: 150 mg/dL (ref 0–200)
HDL: 51.4 mg/dL (ref >39.00)
LDL CALC: 84 mg/dL (ref 0–99)
NONHDL: 99.04
Triglycerides: 76 mg/dL (ref 0.0–149.0)
VLDL: 15.2 mg/dL (ref 0.0–40.0)

## 2018-03-15 LAB — COMPREHENSIVE METABOLIC PANEL
ALT: 26 U/L (ref 0–53)
AST: 23 U/L (ref 0–37)
Albumin: 4.3 g/dL (ref 3.5–5.2)
Alkaline Phosphatase: 54 U/L (ref 39–117)
BILIRUBIN TOTAL: 0.6 mg/dL (ref 0.2–1.2)
BUN: 17 mg/dL (ref 6–23)
CALCIUM: 9.6 mg/dL (ref 8.4–10.5)
CO2: 31 meq/L (ref 19–32)
CREATININE: 1.11 mg/dL (ref 0.40–1.50)
Chloride: 102 mEq/L (ref 96–112)
GFR: 71.62 mL/min (ref >60.00)
Glucose, Bld: 105 mg/dL — ABNORMAL HIGH (ref 70–99)
Potassium: 4.5 mEq/L (ref 3.5–5.1)
Sodium: 139 mEq/L (ref 135–145)
Total Protein: 6.7 g/dL (ref 6.0–8.3)

## 2018-03-15 LAB — PSA: PSA: 0.73 ng/mL (ref 0.10–4.00)

## 2018-03-15 LAB — TSH: TSH: 1.4 u[IU]/mL (ref 0.35–4.50)

## 2018-03-15 LAB — HEMOGLOBIN A1C: Hgb A1c MFr Bld: 5.7 % (ref 4.6–6.5)

## 2018-03-15 NOTE — Progress Notes (Signed)
OFFICE VISIT  03/15/2018   CC:  Chief Complaint  Patient presents with  . Follow-up    HTN, Groin Pain, needs labs drawn, pt is fasting.    HPI:    Patient is a 62 y.o. Caucasian male who presents for 1 mo f/u HTN and chronic groin pain. His hip x-rays recently showed moderate bilat osteoarthritis.  I recommended he see an orthopedist to further eval and see if he thinks his groin pain could be from L hip arthritis. He was not fasting at his recent CPE here so he will get fasting HP + A1c and PSA today.  HTN: I started him on lisinopril 5mg  qd last visit. Home bp's 120s/70s avg, HR avg 70s. No side effects noted from med.  Groin pain: seems to wax and wane in intensity quite a bit. Touching the area is tender.  Notices it more when he sits down.  Up moving around it is relieved.  Occ radiates into medial thigh and occ into abd for short periods. Eating/drinking fine.  Stooling is normal. Coughing makes it a bit worse, says he feels like the area bulges out but he does not see any bulge in the area. He is still concerned he may have a hernia and wants me to make sure he doesn't have one.  ROS: no fevers, no penile d/c, no LE swelling, no abd pain, no n/v, no diarrhea.  No rash.  No joint swelling.  Past Medical History:  Diagnosis Date  . Allergic rhinitis   . Anxiety and depression   . BPH (benign prostatic hyperplasia)    Nocturia is his primary symptom:  Takes Flomax  . Diverticulitis large intestine 2015/16   Recurrent: GI MD [name].  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  . Eustachian tube dysfunction    with recurrent "sinus issues"--ENT (Dr. [name]) recommended surgery for nasal turbinate reduction and septoplasty--as of 11/03/15)  . GERD (gastroesophageal reflux disease)   . Hepatic steatosis   . History of adenomatous polyp of colon   . Hyperlipidemia   . Hypertension    Started med 03/2018  . IBS (irritable bowel  syndrome)   . Inguinal pain, left    Chronic; [name] to do MRI pelvis w/out contrast 08/2017 but insurance denied it.  . Nephrolithiasis 12/23/14   55mm nonobstructive R renal calc on CT abd/pelv done for diverticulitis  . Obesity, Class II, BMI 35-39.9   . OSA (obstructive sleep apnea)   . 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 Prediabetes    HbA1c 6.0% in 2008 and in 2018.  . Skin cancer    Basal cell removed from left hand    Past Surgical History:  Procedure Laterality Date  . COLONOSCOPY  10/17/2002;  07/26/13   Hx of polyps.  Recall 07/2017 per [name]  . COLONOSCOPY  08/24/2017   Diverticulosis from transverse colon to sigmoid.  Otherwise normal.  Repeat in 5-10 years for screening/[name]  . ESOPHAGOGASTRODUODENOSCOPY     need specifics from pt  . HERNIA REPAIR     multiple.  Right inguinal repair by Dr. [name] in 2004.  07/2017 having some muscular/nerve pain in groin--evaluated by Dr. Alice Reichert w/u at that time.  [name] LAPAROSCOPIC SIGMOID COLECTOMY N/A 09/15/2015   Procedure: LAPAROSCOPIC ASSISTED  SIGMOID COLECTOMY;  Surgeon: [name], MD;  Location: [facility];  Service: General;  Laterality: N/A;  . TONSILLECTOMY  Outpatient Medications Prior to Visit  Medication Sig Dispense Refill  . aspirin 81 MG tablet Take 81 mg by mouth daily.      [name] atorvastatin (LIPITOR) 20 MG tablet TAKE 1/2 TABLET BY MOUTH EVERY DAY 45 tablet 0  . cetirizine (ZYRTEC) 10 MG tablet Take 10 mg by mouth daily.    [name] lisinopril (PRINIVIL,ZESTRIL) 5 MG tablet Take 1 tablet (5 mg total) by mouth daily. 90 tablet 3  . Melatonin 5 MG CAPS Take 2 capsules by mouth at bedtime. Reported on 04/20/2016    . Multiple Vitamins-Minerals (MULTIVITAMIN WITH MINERALS) tablet Take 1 tablet by mouth daily.    . naproxen sodium (ALEVE) 220 MG tablet Take 440 mg by mouth 2 (two) times daily as needed. Reported on 04/20/2016    . Omega-3 Fatty Acids (FISH OIL) 1200 MG CAPS Take 3  capsules by mouth daily.     [name] OVER THE COUNTER MEDICATION Super Beta prostate - takes one capsule twice daily    . pantoprazole (PROTONIX) 40 MG tablet TAKE 1 TABLET BY MOUTH EVERY DAY BEFORE BREAKFAST 90 tablet 0  . pregabalin (LYRICA) 75 MG capsule Take 75 mg by mouth 2 (two) times daily.     No facility-administered medications prior to visit.     No Known Allergies  ROS As per HPI  PE: Blood pressure 130/73, pulse 68, temperature 97.8 F (36.6 C), temperature source Oral, resp. rate 16, height 5\' 5"  (1.651 m), weight 233 lb 4 oz (105.8 kg), SpO2 94 %. Gen: Alert, well appearing.  Patient is oriented to person, place, time, and situation. AFFECT: pleasant, lucid thought and speech. CV: RRR, no m/r/g.   LUNGS: CTA bilat, nonlabored resps, good aeration in all lung fields. EXT: no clubbing, cyanosis, or edema.  Groin: mild TTP in L groin region below the level of the inguinal ligament. No mass, adenopathy, erythema, or rash. Left hip extension/flexion cause some increase in his groin pain.  Scrotum w/out mass or tenderness. No hernia palpable.  LABS:  Lab Results  Component Value Date   TSH 1.57 02/10/2017   Lab Results  Component Value Date   WBC 6.0 02/10/2017   HGB 15.3 02/10/2017   HCT 45.2 02/10/2017   MCV 91.2 02/10/2017   PLT 186.0 02/10/2017   Lab Results  Component Value Date   CREATININE 1.05 02/10/2017   BUN 16 02/10/2017   NA 142 02/10/2017   K 4.6 02/10/2017   CL 106 02/10/2017   CO2 31 02/10/2017   Lab Results  Component Value Date   ALT 33 02/10/2017   AST 27 02/10/2017   ALKPHOS 59 02/10/2017   BILITOT 0.6 02/10/2017   Lab Results  Component Value Date   CHOL 156 02/10/2017   Lab Results  Component Value Date   HDL 59.50 02/10/2017   Lab Results  Component Value Date   LDLCALC 84 02/10/2017   Lab Results  Component Value Date   TRIG 63.0 02/10/2017   Lab Results  Component Value Date   CHOLHDL 3 02/10/2017   Lab Results   Component Value Date   PSA 0.72 02/10/2017   PSA 0.52 01/29/2016   PSA 0.49 03/13/2015   Lab Results  Component Value Date   HGBA1C 6.0 02/10/2017    IMPRESSION AND PLAN:  1) HTN: Good control on lisinopril 5mg  qd.  2) Chronic L groin pain: strongly suspect groin strain.  No sign of hernia. Very doubtful that hip osteoarthritis is causing this pain.  Recent CPE when he was not fasting---he is fasting today--release lab orders today (HP, PSA, HbA1c).  An After Visit Summary was printed and given to the patient.  FOLLOW UP: 6 mo [facility] f/u  Signed:  Crissie Sickles, MD           03/15/2018

## 2018-03-16 ENCOUNTER — Encounter: Payer: Self-pay | Admitting: *Deleted

## 2018-03-23 ENCOUNTER — Encounter: Payer: Self-pay | Admitting: *Deleted

## 2018-06-09 ENCOUNTER — Encounter: Payer: Self-pay | Admitting: Family Medicine

## 2018-06-09 ENCOUNTER — Ambulatory Visit (INDEPENDENT_AMBULATORY_CARE_PROVIDER_SITE_OTHER): Admitting: Family Medicine

## 2018-06-09 VITALS — BP 122/76 | HR 68 | Temp 98.3°F | Resp 16 | Ht 65.0 in | Wt 238.4 lb

## 2018-06-09 DIAGNOSIS — R1032 Left lower quadrant pain: Secondary | ICD-10-CM

## 2018-06-09 DIAGNOSIS — K5792 Diverticulitis of intestine, part unspecified, without perforation or abscess without bleeding: Secondary | ICD-10-CM | POA: Diagnosis not present

## 2018-06-09 DIAGNOSIS — Z23 Encounter for immunization: Secondary | ICD-10-CM | POA: Diagnosis not present

## 2018-06-09 MED ORDER — CIPROFLOXACIN HCL 500 MG PO TABS: ORAL_TABLET | ORAL | 0 refills | Status: DC

## 2018-06-09 MED ORDER — METRONIDAZOLE 500 MG PO TABS: ORAL_TABLET | ORAL | 0 refills | Status: DC

## 2018-06-09 NOTE — Progress Notes (Signed)
OFFICE VISIT  06/09/2018   CC:  Chief Complaint  Patient presents with  . Abdominal Pain    LLQ   HPI:    Patient is a 62 y.o. Caucasian male with a history of recurrent diverticulitis who is s/p sigmoid colectomy in 2016 who presents for abdominal pain. Left lower abd region started hurting a few days ago, extends around left side to L LB region.  Some recent constipation. No n/v/d or fever.  Constant dull ache.  He continues to have chronic L groin pain--unchanged. No recent significant dietary changes.  No recent abd strain or trauma.  No dysuria or gross hematuria.  He is emptying bladder well. No otc meds tried.    ROS: no CP, no SOB, no wheezing, no cough, no dizziness, no HAs, no rashes, no melena.  No polyuria or polydipsia.  No myalgias or arthralgias.  He has had some recent bright red hemorrhoidal bleeding assoc with his constipation recently but says this has cleared up in the last couple of days.     Past Medical History:  Diagnosis Date  . Allergic rhinitis   . Anxiety and depression   . BPH (benign prostatic hyperplasia)    Nocturia is his primary symptom:  Takes Flomax  . Diverticulitis large intestine 2015/16   Recurrent: GI MD [name].  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  . Eustachian tube dysfunction    with recurrent "sinus issues"--ENT (Dr. [name]) recommended surgery for nasal turbinate reduction and septoplasty--as of 11/03/15)  . GERD (gastroesophageal reflux disease)   . Hepatic steatosis   . History of adenomatous polyp of colon   . Hyperlipidemia   . Hypertension    Started med 03/2018  . IBS (irritable bowel syndrome)   . Inguinal pain, left    Chronic; [name] to do MRI pelvis w/out contrast 08/2017 but insurance denied it.  . Nephrolithiasis 12/23/14   66mm nonobstructive R renal calc on CT abd/pelv done for diverticulitis  . Obesity, Class II, BMI 35-39.9   . OSA (obstructive sleep apnea)   .  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 Prediabetes    HbA1c 6.0% in 2008 and in 2018.  . Skin cancer    Basal cell removed from left hand    Past Surgical History:  Procedure Laterality Date  . COLONOSCOPY  10/17/2002;  07/26/13   Hx of polyps.  Recall 07/2017 per [name]  . COLONOSCOPY  08/24/2017   Diverticulosis from transverse colon to sigmoid.  Otherwise normal.  Repeat in 5-10 years for screening/[name]  . ESOPHAGOGASTRODUODENOSCOPY     need specifics from pt  . HERNIA REPAIR     multiple.  Right inguinal repair by Dr. [name] in 2004.  07/2017 having some muscular/nerve pain in groin--evaluated by Dr. Alice Reichert w/u at that time.  [name] LAPAROSCOPIC SIGMOID COLECTOMY N/A 09/15/2015   Procedure: LAPAROSCOPIC ASSISTED  SIGMOID COLECTOMY;  Surgeon: [name], MD;  Location: [facility];  Service: General;  Laterality: N/A;  . TONSILLECTOMY      Outpatient Medications Prior to Visit  Medication Sig Dispense Refill  . aspirin 81 MG tablet Take 81 mg by mouth daily.      [name] atorvastatin (LIPITOR) 20 MG tablet TAKE 1/2 TABLET BY MOUTH EVERY DAY 45 tablet 0  . cetirizine (ZYRTEC) 10 MG tablet Take 10 mg by mouth daily.    [name] lisinopril (PRINIVIL,ZESTRIL) 5 MG tablet  Take 1 tablet (5 mg total) by mouth daily. 90 tablet 3  . Melatonin 5 MG CAPS Take 2 capsules by mouth at bedtime. Reported on 04/20/2016    . Multiple Vitamins-Minerals (MULTIVITAMIN WITH MINERALS) tablet Take 1 tablet by mouth daily.    . naproxen sodium (ALEVE) 220 MG tablet Take 440 mg by mouth 2 (two) times daily as needed. Reported on 04/20/2016    . Omega-3 Fatty Acids (FISH OIL) 1200 MG CAPS Take 3 capsules by mouth daily.     [name] OVER THE COUNTER MEDICATION Super Beta prostate - takes one capsule twice daily    . pantoprazole (PROTONIX) 40 MG tablet TAKE 1 TABLET BY MOUTH EVERY DAY BEFORE BREAKFAST 90 tablet 0   No facility-administered medications prior to visit.     No Known  Allergies  ROS As per HPI  PE: Blood pressure 122/76, pulse 68, temperature 98.3 F (36.8 C), temperature source Oral, resp. rate 16, height 5\' 5"  (1.651 m), weight 238 lb 6 oz (108.1 kg), SpO2 93 %. Gen: Alert, well appearing.  Patient is oriented to person, place, time, and situation. AFFECT: pleasant, lucid thought and speech. CV: RRR, no m/r/g.   LUNGS: CTA bilat, nonlabored resps, good aeration in all lung fields. BACK: no CVA tenderness. ABD: soft, non distended, BS normal. No mass, HSM, or bruit. Mild/mod TTP in LLQ, less in lower midline abd and just minimal discomfort to palpation in RLQ.  No guarding or rebound tenderness.   No flank tenderness.  LABS:    Chemistry      Component Value Date/Time   NA 139 03/15/2018 0957   K 4.5 03/15/2018 0957   CL 102 03/15/2018 0957   CO2 31 03/15/2018 0957   BUN 17 03/15/2018 0957   CREATININE 1.11 03/15/2018 0957   CREATININE 0.96 01/29/2016 0840      Component Value Date/Time   CALCIUM 9.6 03/15/2018 0957   ALKPHOS 54 03/15/2018 0957   AST 23 03/15/2018 0957   ALT 26 03/15/2018 0957   BILITOT 0.6 03/15/2018 0957     Lab Results  Component Value Date   WBC 8.1 03/15/2018   HGB 15.2 03/15/2018   HCT 44.2 03/15/2018   MCV 92.0 03/15/2018   PLT 197.0 03/15/2018    IMPRESSION AND PLAN:  Acute LLQ pain c/w classic sx's of acute diverticulitis. Pt is fortunately only mildly ill. Cipro 500mg  bid x 14d and flagyl 500 mg tid x 14d prescribed today. Signs/symptoms to call or return for were reviewed and pt expressed understanding.  An After Visit Summary was printed and given to the patient.  FOLLOW UP: Return if symptoms worsen or fail to improve.  Signed:  Crissie Sickles, MD           06/09/2018

## 2018-07-12 ENCOUNTER — Other Ambulatory Visit: Payer: Self-pay | Admitting: Family Medicine

## 2018-09-15 ENCOUNTER — Ambulatory Visit (INDEPENDENT_AMBULATORY_CARE_PROVIDER_SITE_OTHER): Admitting: Family Medicine

## 2018-09-15 ENCOUNTER — Encounter: Payer: Self-pay | Admitting: Family Medicine

## 2018-09-15 VITALS — BP 115/78 | HR 78 | Temp 98.5°F | Resp 16 | Ht 65.0 in | Wt 241.0 lb

## 2018-09-15 DIAGNOSIS — J069 Acute upper respiratory infection, unspecified: Secondary | ICD-10-CM

## 2018-09-15 DIAGNOSIS — R05 Cough: Secondary | ICD-10-CM

## 2018-09-15 DIAGNOSIS — I10 Essential (primary) hypertension: Secondary | ICD-10-CM | POA: Diagnosis not present

## 2018-09-15 DIAGNOSIS — T464X5A Adverse effect of angiotensin-converting-enzyme inhibitors, initial encounter: Secondary | ICD-10-CM

## 2018-09-15 DIAGNOSIS — E7800 Pure hypercholesterolemia, unspecified: Secondary | ICD-10-CM

## 2018-09-15 DIAGNOSIS — R7303 Prediabetes: Secondary | ICD-10-CM | POA: Diagnosis not present

## 2018-09-15 DIAGNOSIS — B9789 Other viral agents as the cause of diseases classified elsewhere: Secondary | ICD-10-CM

## 2018-09-15 LAB — BASIC METABOLIC PANEL
BUN: 19 mg/dL (ref 6–23)
CALCIUM: 9.4 mg/dL (ref 8.4–10.5)
CHLORIDE: 104 meq/L (ref 96–112)
CO2: 23 meq/L (ref 19–32)
CREATININE: 1.07 mg/dL (ref 0.40–1.50)
GFR: 74.59 mL/min (ref >60.00)
Glucose, Bld: 125 mg/dL — ABNORMAL HIGH (ref 70–99)
Potassium: 3.7 mEq/L (ref 3.5–5.1)
Sodium: 139 mEq/L (ref 135–145)

## 2018-09-15 LAB — HEMOGLOBIN A1C: Hgb A1c MFr Bld: 5.9 % (ref 4.6–6.5)

## 2018-09-15 MED ORDER — AMLODIPINE BESYLATE 5 MG PO TABS: 5.0000 mg | ORAL_TABLET | Freq: Every day | ORAL | 3 refills | Status: DC

## 2018-09-15 NOTE — Patient Instructions (Signed)
Stop your lisinopril.  Start the new bp med I sent in called amlodipine: one tab daily.  Monitor blood pressure at home. Goal blood pressure is <130/80.

## 2018-09-15 NOTE — Progress Notes (Signed)
OFFICE VISIT  09/15/2018   CC:  Chief Complaint  Patient presents with  . Follow-up    RCI, pt is not fasting.     HPI:    Patient is a 62 y.o. Caucasian male who presents for 6 mo f/u HTN, HLD, and prediabetes. He has hx of recurrent diverticulitis for which he had to get partial colectomy 09/2015.  HTN: occ home monitoring shows bp consistently <130/80.  HLD: tolerating statin fine.  Prediabetes: he is not exercising routinely anymore b/c wife in hospital.  Reports about 7d of R maxillary pain but this has now resolved.  Some pressure in ears, some coughing, some clogged nose.  Normal temp.  Some PND, no signif ST.  Alka seltzer cold med + tylenol. Says he has had some chronic tickly throat cough with feeling of throat tightness, worse at night, wonders if it is from lisinopril.   He mentions that he seems to have had a dry/tickly cough ever since getting on lisinopril.  It is not constant, seems to come in "patches" of time.  No wheezing or soft tissue swelling.   Past Medical History:  Diagnosis Date  . Allergic rhinitis   . Anxiety and depression   . BPH (benign prostatic hyperplasia)    Nocturia is his primary symptom:  Takes Flomax  . Diverticulitis large intestine 2015/16   Recurrent: GI MD [name].  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  . Eustachian tube dysfunction    with recurrent "sinus issues"--ENT (Dr. [name]) recommended surgery for nasal turbinate reduction and septoplasty--as of 11/03/15)  . GERD (gastroesophageal reflux disease)   . Hepatic steatosis   . History of adenomatous polyp of colon   . Hyperlipidemia   . Hypertension    Started med 03/2018  . IBS (irritable bowel syndrome)   . Inguinal pain, left    Chronic; [name] to do MRI pelvis w/out contrast 08/2017 but insurance denied it.  . Nephrolithiasis 12/23/14   30mm nonobstructive R renal calc on CT abd/pelv done for diverticulitis  .  Obesity, Class II, BMI 35-39.9   . OSA (obstructive sleep apnea)   . 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 Prediabetes    HbA1c 6.0% in 2008 and in 2018.  . Skin cancer    Basal cell removed from left hand    Past Surgical History:  Procedure Laterality Date  . COLONOSCOPY  10/17/2002;  07/26/13   Hx of polyps.  Recall 07/2017 per [name]  . COLONOSCOPY  08/24/2017   Diverticulosis from transverse colon to sigmoid.  Otherwise normal.  Repeat in 5-10 years for screening/[name]  . ESOPHAGOGASTRODUODENOSCOPY     need specifics from pt  . HERNIA REPAIR     multiple.  Right inguinal repair by Dr. [name] in 2004.  07/2017 having some muscular/nerve pain in groin--evaluated by Dr. Alice Reichert w/u at that time.  [name] LAPAROSCOPIC SIGMOID COLECTOMY N/A 09/15/2015   Procedure: LAPAROSCOPIC ASSISTED  SIGMOID COLECTOMY;  Surgeon: [name], MD;  Location: [facility];  Service: General;  Laterality: N/A;  . TONSILLECTOMY      Outpatient Medications Prior to Visit  Medication Sig Dispense Refill  . aspirin 81 MG tablet Take 81 mg by mouth daily.      [name] atorvastatin (LIPITOR) 20 MG tablet TAKE 1/2 TABLET BY MOUTH DAILY 45 tablet 1  . Melatonin 5 MG CAPS Take 2 capsules by mouth at  bedtime. Reported on 04/20/2016    . Multiple Vitamins-Minerals (MULTIVITAMIN WITH MINERALS) tablet Take 1 tablet by mouth daily.    . naproxen sodium (ALEVE) 220 MG tablet Take 440 mg by mouth 2 (two) times daily as needed. Reported on 04/20/2016    . Omega-3 Fatty Acids (FISH OIL) 1200 MG CAPS Take 3 capsules by mouth daily.     [name] OVER THE COUNTER MEDICATION Super Beta prostate - takes one capsule twice daily    . pantoprazole (PROTONIX) 40 MG tablet TAKE 1 TABLET BY MOUTH EVERY DAY BEFORE BREAKFAST 90 tablet 1  . lisinopril (PRINIVIL,ZESTRIL) 5 MG tablet Take 1 tablet (5 mg total) by mouth daily. 90 tablet 3  . cetirizine (ZYRTEC) 10 MG tablet Take 10 mg by mouth daily.    . ciprofloxacin  (CIPRO) 500 MG tablet 1 tab po bid x 14d (Patient not taking: Reported on 09/15/2018) 28 tablet 0  . metroNIDAZOLE (FLAGYL) 500 MG tablet 1 tab po tid x 14d (Patient not taking: Reported on 09/15/2018) 42 tablet 0   No facility-administered medications prior to visit.     No Known Allergies  ROS As per HPI  PE: Blood pressure 115/78, pulse 78, temperature 98.5 F (36.9 C), temperature source Oral, resp. rate 16, height 5\' 5"  (1.651 m), weight 241 lb (109.3 kg), SpO2 94 %. Body mass index is 40.1 kg/m.  VS: noted--normal. Gen: alert, NAD, NONTOXIC APPEARING. HEENT: eyes without injection, drainage, or swelling.  Ears: EACs clear, TMs with normal light reflex and landmarks.  Nose: Clear rhinorrhea, with some dried, crusty exudate adherent to mildly injected mucosa.  No purulent d/c.  No paranasal sinus TTP.  No facial swelling.  Throat and mouth without focal lesion.  No pharyngial swelling, erythema, or exudate.   Neck: supple, no LAD.   LUNGS: CTA bilat, nonlabored resps.   CV: RRR, no m/r/g. EXT: no c/c/e SKIN: no rash  LABS:  Lab Results  Component Value Date   TSH 1.40 03/15/2018   Lab Results  Component Value Date   WBC 8.1 03/15/2018   HGB 15.2 03/15/2018   HCT 44.2 03/15/2018   MCV 92.0 03/15/2018   PLT 197.0 03/15/2018   Lab Results  Component Value Date   CREATININE 1.07 09/15/2018   BUN 19 09/15/2018   NA 139 09/15/2018   K 3.7 09/15/2018   CL 104 09/15/2018   CO2 23 09/15/2018   Lab Results  Component Value Date   ALT 26 03/15/2018   AST 23 03/15/2018   ALKPHOS 54 03/15/2018   BILITOT 0.6 03/15/2018   Lab Results  Component Value Date   CHOL 150 03/15/2018   Lab Results  Component Value Date   HDL 51.40 03/15/2018   Lab Results  Component Value Date   LDLCALC 84 03/15/2018   Lab Results  Component Value Date   TRIG 76.0 03/15/2018   Lab Results  Component Value Date   CHOLHDL 3 03/15/2018   Lab Results  Component Value Date   PSA  0.73 03/15/2018   PSA 0.72 02/10/2017   PSA 0.52 01/29/2016    Lab Results  Component Value Date   HGBA1C 5.9 09/15/2018    IMPRESSION AND PLAN:  1) Viral URI--resolving. Symptomatic care discussed.  2) HTN: The current medical regimen is effective;  continue present plan and medications. BMET today.  D/c lisinopril and start amlodipine 5mg  qd today-->see #4 below.  3) HLD: tolerating statin.  Lipids great 6 mo ago.  Plan recheck  6 mo.  4) Prediabetes: minimal TLC being undertaken at this time. HbA1c repeat today.  5) ACE-I cough?--d/c lisinopril and start amlodipine 5mg  qd. Therapeutic expectations and side effect profile of medication discussed today.  Patient's questions answered.  An After Visit Summary was printed and given to the patient.  FOLLOW UP: Return in about 3 months (around 12/15/2018) for routine chronic illness f/u (f/u ? ACE-I cough)--30 min.  Signed:  Crissie Sickles, MD           09/15/2018

## 2018-12-12 ENCOUNTER — Other Ambulatory Visit: Payer: Self-pay | Admitting: Family Medicine

## 2018-12-15 ENCOUNTER — Ambulatory Visit: Payer: Self-pay | Admitting: Family Medicine

## 2018-12-19 ENCOUNTER — Other Ambulatory Visit: Payer: Self-pay | Admitting: Family Medicine

## 2019-01-09 ENCOUNTER — Other Ambulatory Visit: Payer: Self-pay | Admitting: Family Medicine

## 2019-02-19 ENCOUNTER — Encounter: Payer: Self-pay | Admitting: Family Medicine

## 2019-03-12 ENCOUNTER — Other Ambulatory Visit: Payer: Self-pay | Admitting: Family Medicine

## 2019-05-09 ENCOUNTER — Encounter: Payer: Self-pay | Admitting: Family Medicine

## 2019-05-09 ENCOUNTER — Other Ambulatory Visit: Payer: Self-pay

## 2019-05-09 ENCOUNTER — Ambulatory Visit (INDEPENDENT_AMBULATORY_CARE_PROVIDER_SITE_OTHER): Admitting: Family Medicine

## 2019-05-09 VITALS — BP 117/80 | HR 75 | Temp 98.2°F | Resp 16 | Ht 65.0 in | Wt 247.6 lb

## 2019-05-09 DIAGNOSIS — Z0000 Encounter for general adult medical examination without abnormal findings: Secondary | ICD-10-CM

## 2019-05-09 DIAGNOSIS — R7303 Prediabetes: Secondary | ICD-10-CM

## 2019-05-09 DIAGNOSIS — Z125 Encounter for screening for malignant neoplasm of prostate: Secondary | ICD-10-CM | POA: Diagnosis not present

## 2019-05-09 DIAGNOSIS — I10 Essential (primary) hypertension: Secondary | ICD-10-CM | POA: Diagnosis not present

## 2019-05-09 DIAGNOSIS — E7800 Pure hypercholesterolemia, unspecified: Secondary | ICD-10-CM | POA: Diagnosis not present

## 2019-05-09 LAB — COMPREHENSIVE METABOLIC PANEL
ALT: 30 U/L (ref 0–53)
AST: 23 U/L (ref 0–37)
Albumin: 4.3 g/dL (ref 3.5–5.2)
Alkaline Phosphatase: 40 U/L (ref 39–117)
BUN: 15 mg/dL (ref 6–23)
CO2: 27 mEq/L (ref 19–32)
Calcium: 9.3 mg/dL (ref 8.4–10.5)
Chloride: 106 mEq/L (ref 96–112)
Creatinine, Ser: 0.91 mg/dL (ref 0.40–1.50)
GFR: 84.42 mL/min (ref >60.00)
Glucose, Bld: 117 mg/dL — ABNORMAL HIGH (ref 70–99)
Potassium: 4.3 mEq/L (ref 3.5–5.1)
Sodium: 140 mEq/L (ref 135–145)
Total Bilirubin: 0.5 mg/dL (ref 0.2–1.2)
Total Protein: 6.6 g/dL (ref 6.0–8.3)

## 2019-05-09 LAB — CBC WITH DIFFERENTIAL/PLATELET
Basophils Absolute: 0 10*3/uL (ref 0.0–0.1)
Basophils Relative: 0.5 % (ref 0.0–3.0)
Eosinophils Absolute: 0.1 10*3/uL (ref 0.0–0.7)
Eosinophils Relative: 2.2 % (ref 0.0–5.0)
HCT: 45.1 % (ref 39.0–52.0)
Hemoglobin: 15 g/dL (ref 13.0–17.0)
Lymphocytes Relative: 28.1 % (ref 12.0–46.0)
Lymphs Abs: 1.7 10*3/uL (ref 0.7–4.0)
MCHC: 33.3 g/dL (ref 30.0–36.0)
MCV: 93.8 fl (ref 78.0–100.0)
Monocytes Absolute: 0.6 10*3/uL (ref 0.1–1.0)
Monocytes Relative: 10.8 % (ref 3.0–12.0)
Neutro Abs: 3.5 10*3/uL (ref 1.4–7.7)
Neutrophils Relative %: 58.4 % (ref 43.0–77.0)
Platelets: 201 10*3/uL (ref 150.0–400.0)
RBC: 4.81 Mil/uL (ref 4.22–5.81)
RDW: 13.2 % (ref 11.5–15.5)
WBC: 5.9 10*3/uL (ref 4.0–10.5)

## 2019-05-09 LAB — LIPID PANEL
Cholesterol: 151 mg/dL (ref 0–200)
HDL: 46.7 mg/dL (ref >39.00)
LDL Cholesterol: 85 mg/dL (ref 0–99)
NonHDL: 104.71
Total CHOL/HDL Ratio: 3
Triglycerides: 101 mg/dL (ref 0.0–149.0)
VLDL: 20.2 mg/dL (ref 0.0–40.0)

## 2019-05-09 LAB — PSA: PSA: 0.51 ng/mL (ref 0.10–4.00)

## 2019-05-09 LAB — HEMOGLOBIN A1C: Hgb A1c MFr Bld: 6.1 % (ref 4.6–6.5)

## 2019-05-09 NOTE — Progress Notes (Signed)
Office Note 05/09/2019  CC:  Chief Complaint  Patient presents with  . Annual Exam    pt is fasting    HPI:  Kevin Young is a 63 y.o. White male who is here for annual health maintenance exam.  Struggling with his weight, admits he hasn't been eating or exercising correctly. Tries to walk three days a week. Lots of anxiety, life changes surrounding his wife's health.  Past Medical History:  Diagnosis Date  . Allergic rhinitis   . Anxiety and depression   . BPH (benign prostatic hyperplasia)    Nocturia is his primary symptom:  Takes Flomax  . Diverticulitis large intestine 2015/16   Recurrent: GI MD [name].  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  . Eustachian tube dysfunction    with recurrent "sinus issues"--ENT (Dr. [name]) recommended surgery for nasal turbinate reduction and septoplasty--as of 11/03/15)  . GERD (gastroesophageal reflux disease)   . Hepatic steatosis   . History of adenomatous polyp of colon   . Hyperlipidemia   . Hypertension    Started med 03/2018  . IBS (irritable bowel syndrome)   . Inguinal pain, left    Chronic; [name] to do MRI pelvis w/out contrast 08/2017 but insurance denied it.  . Nephrolithiasis 12/23/14   31mm nonobstructive R renal calc on CT abd/pelv done for diverticulitis  . Obesity, Class II, BMI 35-39.9   . OSA (obstructive sleep apnea)   . 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 Prediabetes    HbA1c 6.0% in 2008 and in 2018.  . Skin cancer    Basal cell removed from left hand    Past Surgical History:  Procedure Laterality Date  . COLONOSCOPY  10/17/2002;  07/26/13   Hx of polyps.  Recall 07/2017 per [name]  . COLONOSCOPY  08/24/2017   Diverticulosis from transverse colon to sigmoid.  Otherwise normal.  Repeat in 5-10 years for screening/[name]  . ESOPHAGOGASTRODUODENOSCOPY     need specifics from pt  . HERNIA REPAIR     multiple.  Right  inguinal repair by Dr. [name] in 2004.  07/2017 having some muscular/nerve pain in groin--evaluated by Dr. Alice Reichert w/u at that time.  [name] LAPAROSCOPIC SIGMOID COLECTOMY N/A 09/15/2015   Procedure: LAPAROSCOPIC ASSISTED  SIGMOID COLECTOMY;  Surgeon: [name], MD;  Location: [facility];  Service: General;  Laterality: N/A;  . TONSILLECTOMY      Family History  Problem Relation Age of Onset  . GI problems Mother   . Cancer Father        skin  . Hypertension Father   . Glaucoma Father   . Migraines Sister   . Seizures Brother   . Depression Brother     Social History   Socioeconomic History  . Marital status: Married    Spouse name: Not on file  . Number of children: Not on file  . Years of education: Not on file  . Highest education level: Not on file  Occupational History  . Occupation: truck Education administrator: [employer]  . Financial resource strain: Not on file  . Food insecurity    Worry: Not on file    Inability: Not on file  . Transportation needs    Medical: Not on file    Non-medical: Not on file  Tobacco Use  . Smoking status: Former Smoker    Types: Cigars  Quit date: 12/02/2009    Years since quitting: 9.4  . Smokeless tobacco: Former Systems developer    Types: [place] date: 08/23/2013  Substance and Sexual Activity  . Alcohol use: Yes    Alcohol/week: 5.0 standard drinks    Types: 5 Cans of beer per week    Comment: 7-10 a week  . Drug use: No  . Sexual activity: Not on file  Lifestyle  . Physical activity    Days per week: Not on file    Minutes per session: Not on file  . Stress: Not on file  Relationships  . Social Herbalist on phone: Not on file    Gets together: Not on file    Attends religious service: Not on file    Active member of club or organization: Not on file    Attends meetings of clubs or organizations: Not on file    Relationship status: Not on file  . Intimate partner violence    Fear  of current or ex partner: Not on file    Emotionally abused: Not on file    Physically abused: Not on file    Forced sexual activity: Not on file  Other Topics Concern  . Not on file  Social History Narrative   Married, step children.   Orig from [facility].   Occupation: delivery driver.   Former Facilities manager tob user, quit 2010.   Alcohol: 1 beer a day.    Outpatient Medications Prior to Visit  Medication Sig Dispense Refill  . amLODipine (NORVASC) 5 MG tablet TAKE 1 TABLET BY MOUTH DAILY 90 tablet 0  . aspirin 81 MG tablet Take 81 mg by mouth daily.      [name] atorvastatin (LIPITOR) 20 MG tablet TAKE 1/2 TABLET BY MOUTH DAILY 45 tablet 1  . cetirizine (ZYRTEC) 10 MG tablet Take 10 mg by mouth daily.    . Melatonin 5 MG CAPS Take 2 capsules by mouth at bedtime. Reported on 04/20/2016    . Multiple Vitamins-Minerals (MULTIVITAMIN WITH MINERALS) tablet Take 1 tablet by mouth daily.    . naproxen sodium (ALEVE) 220 MG tablet Take 440 mg by mouth 2 (two) times daily as needed. Reported on 04/20/2016    . Omega-3 Fatty Acids (FISH OIL) 1200 MG CAPS Take 3 capsules by mouth daily.     [name] OVER THE COUNTER MEDICATION Super Beta prostate - takes one capsule twice daily    . pantoprazole (PROTONIX) 40 MG tablet TAKE 1 TABLET BY MOUTH EVERY DAY BEFORE BREAKFAST 90 tablet 1   No facility-administered medications prior to visit.     No Known Allergies  ROS Review of Systems  Constitutional: Negative for appetite change, chills, fatigue and fever.  HENT: Negative for congestion, dental problem, ear pain and sore throat.   Eyes: Negative for discharge, redness and visual disturbance.  Respiratory: Negative for cough, chest tightness, shortness of breath and wheezing.   Cardiovascular: Negative for chest pain, palpitations and leg swelling.  Gastrointestinal: Negative for abdominal pain, blood in stool, diarrhea, nausea and vomiting.  Genitourinary: Negative for difficulty urinating, dysuria,  flank pain, frequency, hematuria and urgency.  Musculoskeletal: Negative for arthralgias, back pain, joint swelling, myalgias and neck stiffness.  Skin: Negative for pallor and rash.  Neurological: Negative for dizziness, speech difficulty, weakness and headaches.  Hematological: Negative for adenopathy. Does not bruise/bleed easily.  Psychiatric/Behavioral: Negative for confusion and sleep disturbance. The patient is not nervous/anxious.  PE; Blood pressure 117/80, pulse 75, temperature 98.2 F (36.8 C), temperature source Temporal, resp. rate 16, height 5\' 5"  (1.651 m), weight 247 lb 9.6 oz (112.3 kg), SpO2 94 %. Gen: Alert, well appearing.  Patient is oriented to person, place, time, and situation. AFFECT: pleasant, lucid thought and speech. ENT: Ears: EACs clear, normal epithelium.  TMs with good light reflex and landmarks bilaterally.  Eyes: no injection, icteris, swelling, or exudate.  EOMI, PERRLA. Nose: no drainage or turbinate edema/swelling.  No injection or focal lesion.  Mouth: lips without lesion/swelling.  Oral mucosa pink and moist.  Dentition intact and without obvious caries or gingival swelling.  Oropharynx without erythema, exudate, or swelling.  Neck: supple/nontender.  No LAD, mass, or TM.  Carotid pulses 2+ bilaterally, without bruits. CV: RRR, no m/r/g.   LUNGS: CTA bilat, nonlabored resps, good aeration in all lung fields. ABD: soft, NT, ND, BS normal.  No hepatospenomegaly or mass.  No bruits. EXT: no clubbing, cyanosis, or edema.  Musculoskeletal: no joint swelling, erythema, warmth, or tenderness.  ROM of all joints intact. Skin - no sores or suspicious lesions or rashes or color changes Rectal exam: negative without mass, lesions or tenderness, PROSTATE EXAM: smooth and symmetric without nodules or tenderness.   Pertinent labs:  Lab Results  Component Value Date   TSH 1.40 03/15/2018   Lab Results  Component Value Date   WBC 8.1 03/15/2018   HGB 15.2  03/15/2018   HCT 44.2 03/15/2018   MCV 92.0 03/15/2018   PLT 197.0 03/15/2018   Lab Results  Component Value Date   CREATININE 1.07 09/15/2018   BUN 19 09/15/2018   NA 139 09/15/2018   K 3.7 09/15/2018   CL 104 09/15/2018   CO2 23 09/15/2018   Lab Results  Component Value Date   ALT 26 03/15/2018   AST 23 03/15/2018   ALKPHOS 54 03/15/2018   BILITOT 0.6 03/15/2018   Lab Results  Component Value Date   CHOL 150 03/15/2018   Lab Results  Component Value Date   HDL 51.40 03/15/2018   Lab Results  Component Value Date   LDLCALC 84 03/15/2018   Lab Results  Component Value Date   TRIG 76.0 03/15/2018   Lab Results  Component Value Date   CHOLHDL 3 03/15/2018   Lab Results  Component Value Date   PSA 0.73 03/15/2018   PSA 0.72 02/10/2017   PSA 0.52 01/29/2016   Lab Results  Component Value Date   HGBA1C 5.9 09/15/2018    ASSESSMENT AND PLAN:   Health maintenance exam: Reviewed age and gender appropriate health maintenance issues (prudent diet, regular exercise, health risks of tobacco and excessive alcohol, use of seatbelts, fire alarms in home, use of sunscreen).  Also reviewed age and gender appropriate health screening as well as vaccine recommendations. Vaccines: All UTD, including shingrix. Labs: fasting CBC, CMET, FLP, PSA, A1c (prediab). Prostate ca screening: DRE normal, PSA. Colon ca screening: next colonoscopy due 2023-2028 range ([name]).  An After Visit Summary was printed and given to the patient.  FOLLOW UP:  Return in about 6 months (around 11/09/2019) for routine chronic illness f/u.  Signed:  Crissie Sickles, MD           05/09/2019

## 2019-05-09 NOTE — Patient Instructions (Signed)

## 2019-06-10 ENCOUNTER — Other Ambulatory Visit: Payer: Self-pay | Admitting: Family Medicine

## 2019-06-28 ENCOUNTER — Other Ambulatory Visit: Payer: Self-pay

## 2019-06-28 MED ORDER — ATORVASTATIN CALCIUM 20 MG PO TABS: 10.0000 mg | ORAL_TABLET | Freq: Every day | ORAL | 1 refills | Status: DC

## 2019-10-05 DIAGNOSIS — K432 Incisional hernia without obstruction or gangrene: Secondary | ICD-10-CM

## 2019-10-05 HISTORY — DX: Incisional hernia without obstruction or gangrene: K43.2

## 2019-12-03 ENCOUNTER — Other Ambulatory Visit: Payer: Self-pay

## 2019-12-03 ENCOUNTER — Encounter: Payer: Self-pay | Admitting: Family Medicine

## 2019-12-03 ENCOUNTER — Ambulatory Visit (INDEPENDENT_AMBULATORY_CARE_PROVIDER_SITE_OTHER): Admitting: Family Medicine

## 2019-12-03 VITALS — BP 125/83 | HR 89 | Temp 97.7°F | Resp 16 | Ht 65.0 in | Wt 256.0 lb

## 2019-12-03 DIAGNOSIS — K5792 Diverticulitis of intestine, part unspecified, without perforation or abscess without bleeding: Secondary | ICD-10-CM | POA: Diagnosis not present

## 2019-12-03 DIAGNOSIS — K439 Ventral hernia without obstruction or gangrene: Secondary | ICD-10-CM | POA: Diagnosis not present

## 2019-12-03 DIAGNOSIS — R1032 Left lower quadrant pain: Secondary | ICD-10-CM

## 2019-12-03 MED ORDER — CIPROFLOXACIN HCL 500 MG PO TABS: 500.0000 mg | ORAL_TABLET | Freq: Two times a day (BID) | ORAL | 0 refills | Status: AC

## 2019-12-03 MED ORDER — AMLODIPINE BESYLATE 5 MG PO TABS: 5.0000 mg | ORAL_TABLET | Freq: Every day | ORAL | 3 refills | Status: DC

## 2019-12-03 MED ORDER — ATORVASTATIN CALCIUM 20 MG PO TABS: 10.0000 mg | ORAL_TABLET | Freq: Every day | ORAL | 3 refills | Status: DC

## 2019-12-03 MED ORDER — METRONIDAZOLE 500 MG PO TABS: 500.0000 mg | ORAL_TABLET | Freq: Three times a day (TID) | ORAL | 0 refills | Status: AC

## 2019-12-03 MED ORDER — PANTOPRAZOLE SODIUM 40 MG PO TBEC: DELAYED_RELEASE_TABLET | ORAL | 3 refills | Status: DC

## 2019-12-03 NOTE — Progress Notes (Signed)
OFFICE VISIT  12/03/2019   CC:  Chief Complaint  Patient presents with  . Abdominal Pain    groin area, left side x 74month   HPI:    Patient is a 64 y.o. Caucasian male who presents for pain in left groin. Onset about 1 mo ago, LLQ and L groin pain, dull ache continuously and some fluctuation in intensity. Notes mild asymmetry of lower abd wall, L protruding more than R.  No rash. No n/v, no constip or diarrhea.  No blood or pus in stool.  No fevers.  Appetite is fine. Sometimes feels tender when pressing abd against counter or similar. Takes some tylenol sometimes, not much help noted.  Past Medical History:  Diagnosis Date  . Allergic rhinitis   . Anxiety and depression   . BPH (benign prostatic hyperplasia)    Nocturia is his primary symptom:  Takes Flomax  . Diverticulitis large intestine 2015/16   Recurrent: GI MD [name].  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  . Eustachian tube dysfunction    with recurrent "sinus issues"--ENT (Dr. [name]) recommended surgery for nasal turbinate reduction and septoplasty--as of 11/03/15)  . GERD (gastroesophageal reflux disease)   . Hepatic steatosis   . History of adenomatous polyp of colon   . Hyperlipidemia   . Hypertension    Started med 03/2018  . IBS (irritable bowel syndrome)   . Inguinal pain, left    Chronic; [name] to do MRI pelvis w/out contrast 08/2017 but insurance denied it.  . Nephrolithiasis 12/23/14   58mm nonobstructive R renal calc on CT abd/pelv done for diverticulitis  . Obesity, Class II, BMI 35-39.9   . OSA (obstructive sleep apnea)   . 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 Prediabetes    HbA1c 6.0% in 2008 and in 2018.  . Skin cancer    Basal cell removed from left hand    Past Surgical History:  Procedure Laterality Date  . COLONOSCOPY  10/17/2002;  07/26/13   Hx of polyps.  Recall 07/2017 per [name]  . COLONOSCOPY  08/24/2017   Diverticulosis from transverse colon to sigmoid.  Otherwise normal.  Repeat in 5-10 years for screening/[name]  . ESOPHAGOGASTRODUODENOSCOPY     need specifics from pt  . HERNIA REPAIR     multiple.  Right inguinal repair by Dr. [name] in 2004.  07/2017 having some muscular/nerve pain in groin--evaluated by Dr. Alice Reichert w/u at that time.  [name] LAPAROSCOPIC SIGMOID COLECTOMY N/A 09/15/2015   Procedure: LAPAROSCOPIC ASSISTED  SIGMOID COLECTOMY;  Surgeon: [name], MD;  Location: [facility];  Service: General;  Laterality: N/A;  . TONSILLECTOMY      Outpatient Medications Prior to Visit  Medication Sig Dispense Refill  . APPLE CIDER VINEGAR PO Take by mouth daily.    . cetirizine (ZYRTEC) 10 MG tablet Take 10 mg by mouth daily.    . Melatonin 5 MG CAPS Take 2 capsules by mouth at bedtime. Reported on 04/20/2016    . Multiple Vitamins-Minerals (MULTIVITAMIN WITH MINERALS) tablet Take 1 tablet by mouth daily.    . Omega-3 Fatty Acids (FISH OIL) 1200 MG CAPS Take 3 capsules by mouth daily.     [name] OVER THE COUNTER MEDICATION Super Beta prostate - takes one capsule twice daily    . amLODipine (NORVASC) 5 MG tablet TAKE 1 TABLET BY MOUTH EVERY DAY 90 tablet 1  . atorvastatin (LIPITOR)  20 MG tablet Take 0.5 tablets (10 mg total) by mouth daily. 45 tablet 1  . pantoprazole (PROTONIX) 40 MG tablet TAKE 1 TABLET BY MOUTH EVERY DAY BEFORE BREAKFAST 90 tablet 1  . aspirin 81 MG tablet Take 81 mg by mouth daily.      . naproxen sodium (ALEVE) 220 MG tablet Take 440 mg by mouth 2 (two) times daily as needed. Reported on 04/20/2016     No facility-administered medications prior to visit.    No Known Allergies  ROS As per HPI  PE: Blood pressure 125/83, pulse 89, temperature 97.7 F (36.5 C), temperature source Temporal, resp. rate 16, height 5\' 5"  (1.651 m), weight 256 lb (116.1 kg), SpO2 93 %. Gen: Alert, well appearing.  Patient is oriented to person, place, time, and situation. AFFECT:  pleasant, lucid thought and speech. CV: RRR, no m/r/g.   LUNGS: CTA bilat, nonlabored resps, good aeration in all lung fields. ABD: soft, focal protrusion about softball size in LLQ lateral to a well healed vertical midline surgical incision with overlying stria but otherwise no rash.  With abd wall contraction the protrusion is made more evident. No stria or protrusion on R abdominal area.   Palpation in LLQ is mildly tender diffusely, extending into L inguinal region. No mass in area of protrusion.  No guarding or rebound tenderness.  BS normal.  No bruit.   LABS:    Chemistry      Component Value Date/Time   NA 140 05/09/2019 0948   K 4.3 05/09/2019 0948   CL 106 05/09/2019 0948   CO2 27 05/09/2019 0948   BUN 15 05/09/2019 0948   CREATININE 0.91 05/09/2019 0948   CREATININE 0.96 01/29/2016 0840      Component Value Date/Time   CALCIUM 9.3 05/09/2019 0948   ALKPHOS 40 05/09/2019 0948   AST 23 05/09/2019 0948   ALT 30 05/09/2019 0948   BILITOT 0.5 05/09/2019 0948     Lab Results  Component Value Date   HGBA1C 6.1 05/09/2019    IMPRESSION AND PLAN:  Subacute LLQ abd pain:  I think he has an abdominal wall hernia lateral to his surgical incision. Has significant hx of diverticulosis w/diverticulitis, +hx of sigmoid colectomy for acute/recurrent diverticulitis back in 2016.  Lower suspicion of acute diverticulitis right now but will go ahead and treat with cipro and flagyl b/c potential benefit > risk.  I recommended he make appt to see his surgeon for eval of his abd wall hernia.  An After Visit Summary was printed and given to the patient.  FOLLOW UP: Return if symptoms worsen or fail to improve.  Signed:  Crissie Sickles, MD           12/03/2019

## 2019-12-26 ENCOUNTER — Encounter: Payer: Self-pay | Admitting: Family Medicine

## 2020-03-12 ENCOUNTER — Ambulatory Visit: Payer: Self-pay | Admitting: General Surgery

## 2020-03-12 NOTE — H&P (Signed)
Kevin Young Appointment: 03/12/2020 10:10 AM Location: [facility] Patient #: [id number] DOB: [birth year] Married / Language: English / Race: White Male   History of Present Illness Kevin Young E. Grandville Silos MD; 03/12/2020 10:27 AM) The patient is a 64 year old male who presents with an incisional hernia. Patient status post lap-assisted sigmoid colectomy in 2016. He developed an incisional hernia which I have been following him for. I saw him earlier this year and his wife was going to have surgery so he wanted to hold off at that time. Unfortunately he has had several stressful life events recently including his father passing away in one of his sisters having a significant medical problem. His wife continues to recover from hip replacement surgery. His hernia, however, continues to bother him and he feels it is getting bigger. His very nervous about it worsening. He is eating well. He is moving his bowels. He is wearing an abdominal binder for support.   Allergies Sabino Gasser, CMA; 03/12/2020 10:07 AM) No Known Allergies  [06/25/2015]: Allergies Reconciled   Medication History Sabino Gasser, CMA; 03/12/2020 10:07 AM) Pantoprazole Sodium (40MG  Tablet DR, Oral daily) Active. Biotin (10MG  Tablet, Oral daily) Active. Finasteride (5MG  Tablet, Oral) Active. Aspirin EC (81MG  Tablet DR, Oral daily) Active. Atorvastatin Calcium (20MG  Tablet, Oral daily) Active. Fluticasone Propionate (50MCG/ACT Suspension, Nasal daily) Active. Melatonin (5MG  Tablet, Oral daily) Active. Montelukast Sodium (10MG  Tablet, Oral daily) Active. Multiple Vitamin (1 (one) Oral daily) Active. Probiotic Daily (Oral daily) Active. Ibuprofen (800MG  Tablet, Oral) Active. Medications Reconciled  Vitals Sabino Gasser CMA; 03/12/2020 10:07 AM) 03/12/2020 10:07 AM Weight: 254.2 lb Height: 66in Body Surface Area: 2.21 m Body Mass Index: 41.03 kg/m  Temp.: 97.77F (Tympanic)  Pulse:  100 (Regular)  BP: 134/82(Sitting, Left Arm, Standard)       Physical Exam Kevin Young E. Grandville Silos MD; 03/12/2020 10:28 AM) General Mental Status-Alert. General Appearance-Consistent with stated age. Hydration-Well hydrated. Voice-Normal.  Head and Neck Head-normocephalic, atraumatic with no lesions or palpable masses. Trachea-midline. Thyroid Gland Characteristics - normal size and consistency.  Eye Eyeball - Bilateral-Extraocular movements intact. Sclera/Conjunctiva - Bilateral-No scleral icterus.  Chest and Lung Exam Chest and lung exam reveals -quiet, even and easy respiratory effort with no use of accessory muscles and on auscultation, normal breath sounds, no adventitious sounds and normal vocal resonance. Inspection Chest Wall - Normal. Back - normal.  Cardiovascular Cardiovascular examination reveals -normal heart sounds, regular rate and rhythm with no murmurs and normal pedal pulses bilaterally.  Abdomen Inspection Inspection of the abdomen reveals - Note: Old lower midline scar, large incisional hernia extending from midline the left lower quadrant easily reduces but the fascial defect is 8-10 cm across. Palpation/Percussion Palpation and Percussion of the abdomen reveal - Soft, Non Tender, No Rebound tenderness, No Rigidity (guarding) and No hepatosplenomegaly. Auscultation Auscultation of the abdomen reveals - Bowel sounds normal.  Neurologic Neurologic evaluation reveals -alert and oriented x 3 with no impairment of recent or remote memory. Mental Status-Normal.  Musculoskeletal Global Assessment -Note: no gross deformities.  Normal Exam - Left-Upper Extremity Strength Normal and Lower Extremity Strength Normal. Normal Exam - Right-Upper Extremity Strength Normal and Lower Extremity Strength Normal.  Lymphatic Head & Neck  General Head & Neck Lymphatics: Bilateral - Description - Normal. Axillary  General Axillary  Region: Bilateral - Description - Normal. Tenderness - Non Tender. Femoral & Inguinal  Generalized Femoral & Inguinal Lymphatics: Bilateral - Description - No Generalized lymphadenopathy.    Assessment & Plan Kevin Young E. Grandville Silos  MD; 03/12/2020 10:29 AM) Fatima Blank HERNIA, WITHOUT OBSTRUCTION OR GANGRENE (K43.2) Impression: This hernia continues to enlarge. I feel I will need to use an open approach to safely repair it. We will plan open incisional hernia repair with mesh. This will require at least an overnight hospital stay. I discussed the procedure, risks, and benefits. I discussed the expected postoperative course. He has made some arrangements with his family for help in the postoperative period. I look forward to scheduling.

## 2020-04-17 NOTE — Progress Notes (Signed)
[facility] [place], [place] - [address] [place] [address] Phone: [phone] Fax: [phone]      Your procedure is scheduled on Tuesday, June 20th.  Report to [facility] Main Entrance "A" at 8:00 A.M., and check in at the Admitting office.  Call this number if you have problems the morning of surgery:  [phone]  Call [phone] if you have any questions prior to your surgery date Monday-Friday 8am-4pm    Remember:  Do not eat after midnight the night before your surgery  You may drink clear liquids until 7:00 AM the morning of your surgery.   Clear liquids allowed are: Water, Non-Citrus Juices (without pulp), Carbonated Beverages, Clear Tea, Black Coffee Only, and Gatorade    Take these medicines the morning of surgery with A SIP OF WATER   Tylenol - if needed  Atrovastatin (Lipitor)  Pantoprazole (Protonix)    As of today, STOP taking any Aspirin (unless otherwise instructed by your surgeon) Aleve, Naproxen, Ibuprofen, Motrin, Advil, Goody's, BC's, all herbal medications, fish oil, and all vitamins.                      Do not wear jewelry            Do not wear lotions, powders, colognes, or deodorant.            Men may shave face and neck.            Do not bring valuables to the hospital.            [facility] is not responsible for any belongings or valuables.  Do NOT Smoke (Tobacco/Vaping) or drink Alcohol 24 hours prior to your procedure If you use a CPAP at night, you may bring all equipment for your overnight stay.   Contacts, glasses, dentures or bridgework may not be worn into surgery.      For patients admitted to the hospital, discharge time will be determined by your treatment team.   Patients discharged the day of surgery will not be allowed to drive home, and someone needs to stay with them for 24 hours.    Special instructions:   [place]- Preparing  For Surgery  Before surgery, you can play an important role. Because skin is not sterile, your skin needs to be as free of germs as possible. You can reduce the number of germs on your skin by washing with CHG (chlorahexidine gluconate) Soap before surgery.  CHG is an antiseptic cleaner which kills germs and bonds with the skin to continue killing germs even after washing.    Oral Hygiene is also important to reduce your risk of infection.  Remember - BRUSH YOUR TEETH THE MORNING OF SURGERY WITH YOUR REGULAR TOOTHPASTE  Please do not use if you have an allergy to CHG or antibacterial soaps. If your skin becomes reddened/irritated stop using the CHG.  Do not shave (including legs and underarms) for at least 48 hours prior to first CHG shower. It is OK to shave your face.  Please follow these instructions carefully.   1. Shower the NIGHT BEFORE SURGERY and the MORNING OF SURGERY with CHG Soap.   2. If you chose to wash your hair, wash your hair first as usual with your normal shampoo.  3. After you shampoo, rinse your hair and body thoroughly to remove the shampoo.  4. Use CHG as you would any other liquid soap. You can apply CHG directly to the skin and wash gently with a scrungie or a clean washcloth.   5. Apply the CHG Soap to your body ONLY FROM THE NECK DOWN.  Do not use on open wounds or open sores. Avoid contact with your eyes, ears, mouth and genitals (private parts). Wash Face and genitals (private parts)  with your normal soap.   6. Wash thoroughly, paying special attention to the area where your surgery will be performed.  7. Thoroughly rinse your body with warm water from the neck down.  8. DO NOT shower/wash with your normal soap after using and rinsing off the CHG Soap.  9. Pat yourself dry with a CLEAN TOWEL.  10. Wear CLEAN PAJAMAS to bed the night before surgery  11. Place CLEAN SHEETS on your bed the night of your first shower and DO NOT SLEEP WITH PETS.   Day of  Surgery: Wear Clean/Comfortable clothing the morning of surgery Do not apply any deodorants/lotions.   Remember to brush your teeth WITH YOUR REGULAR TOOTHPASTE.   Please read over the following fact sheets that you were given.

## 2020-04-18 ENCOUNTER — Encounter (HOSPITAL_COMMUNITY): Payer: Self-pay

## 2020-04-18 ENCOUNTER — Other Ambulatory Visit: Payer: Self-pay

## 2020-04-18 ENCOUNTER — Other Ambulatory Visit (HOSPITAL_COMMUNITY)
Admission: RE | Admit: 2020-04-18 | Discharge: 2020-04-18 | Disposition: A | Discharge status: Home / Self Care | Source: Ambulatory Visit | Attending: General Surgery | Admitting: General Surgery

## 2020-04-18 ENCOUNTER — Encounter (HOSPITAL_COMMUNITY)
Admission: RE | Admit: 2020-04-18 | Discharge: 2020-04-18 | Disposition: A | Discharge status: Home / Self Care | Source: Ambulatory Visit | Attending: General Surgery | Admitting: General Surgery

## 2020-04-18 DIAGNOSIS — Z20822 Contact with and (suspected) exposure to covid-19: Secondary | ICD-10-CM | POA: Insufficient documentation

## 2020-04-18 DIAGNOSIS — Z01818 Encounter for other preprocedural examination: Secondary | ICD-10-CM | POA: Diagnosis not present

## 2020-04-18 LAB — BASIC METABOLIC PANEL
Anion gap: 8 (ref 5–15)
BUN: 15 mg/dL (ref 8–23)
CO2: 26 mmol/L (ref 22–32)
Calcium: 9.1 mg/dL (ref 8.9–10.3)
Chloride: 106 mmol/L (ref 98–111)
Creatinine, Ser: 1.02 mg/dL (ref 0.61–1.24)
GFR calc Af Amer: >60 mL/min (ref >60)
GFR calc non Af Amer: >60 mL/min (ref >60)
Glucose, Bld: 109 mg/dL — ABNORMAL HIGH (ref 70–99)
Potassium: 3.8 mmol/L (ref 3.5–5.1)
Sodium: 140 mmol/L (ref 135–145)

## 2020-04-18 LAB — SARS CORONAVIRUS 2 (TAT 6-24 HRS): SARS Coronavirus 2: NEGATIVE

## 2020-04-18 LAB — CBC
HCT: 44.4 % (ref 39.0–52.0)
Hemoglobin: 15.2 g/dL (ref 13.0–17.0)
MCH: 31.7 pg (ref 26.0–34.0)
MCHC: 34.2 g/dL (ref 30.0–36.0)
MCV: 92.7 fL (ref 80.0–100.0)
Platelets: 206 10*3/uL (ref 150–400)
RBC: 4.79 MIL/uL (ref 4.22–5.81)
RDW: 12.9 % (ref 11.5–15.5)
WBC: 7.3 10*3/uL (ref 4.0–10.5)
nRBC: 0 % (ref 0.0–0.2)

## 2020-04-18 LAB — HEMOGLOBIN A1C
Hgb A1c MFr Bld: 5.9 % — ABNORMAL HIGH (ref 4.8–5.6)
Mean Plasma Glucose: 122.63 mg/dL

## 2020-04-18 LAB — GLUCOSE, CAPILLARY: Glucose-Capillary: 134 mg/dL — ABNORMAL HIGH (ref 70–99)

## 2020-04-18 NOTE — Progress Notes (Addendum)
PCP Anitra Lauth, MD Cardiologist - pt denies   Chest x-ray - n/a EKG - 04-18-20 Stress Test - pt denies ECHO - pt denies Cardiac Cath - pt denies   Fasting Blood Sugar - pt unclear - states he is pre-diabetic  Checks Blood Sugar very rarely at home but does have a CBG meter.   Blood Thinner Instructions:n/a Aspirin Instructions:n/a  ERAS Protcol - yes PRE-SURGERY Ensure or G2- no drink ordered COVID TEST- 04/18/20   Anesthesia review: EKG  Patient denies shortness of breath, fever, cough and chest pain at PAT appointment   All instructions explained to the patient, with a verbal understanding of the material. Patient agrees to go over the instructions while at home for a better understanding. Patient also instructed to self quarantine after being tested for COVID-19. The opportunity to ask questions was provided.

## 2020-04-21 NOTE — Anesthesia Preprocedure Evaluation (Addendum)
Anesthesia Evaluation  Patient identified by MRN, date of birth, ID band Patient awake    Reviewed: Allergy & Precautions, NPO status , Patient's Chart, lab work & pertinent test results  History of Anesthesia Complications Negative for: history of anesthetic complications  Airway Mallampati: III  TM Distance: >3 FB Neck ROM: Full    Dental no notable dental hx.    Pulmonary sleep apnea , former smoker,    Pulmonary exam normal        Cardiovascular hypertension, Pt. on medications Normal cardiovascular exam     Neuro/Psych Anxiety Depression negative neurological ROS     GI/Hepatic Neg liver ROS, GERD  Medicated and Controlled,Incisional hernia   Endo/Other  Morbid obesity  Renal/GU negative Renal ROS  negative genitourinary   Musculoskeletal  (+) Arthritis ,   Abdominal   Peds  Hematology negative hematology ROS (+)   Anesthesia Other Findings Day of surgery medications reviewed with patient.  Reproductive/Obstetrics negative OB ROS                            Anesthesia Physical Anesthesia Plan  ASA: III  Anesthesia Plan: General   Post-op Pain Management:    Induction: Intravenous  PONV Risk Score and Plan: 4 or greater and Treatment may vary due to age or medical condition, Midazolam, Ondansetron and Dexamethasone  Airway Management Planned: Oral ETT  Additional Equipment: None  Intra-op Plan:   Post-operative Plan: Extubation in OR  Informed Consent: I have reviewed the patients History and Physical, chart, labs and discussed the procedure including the risks, benefits and alternatives for the proposed anesthesia with the patient or authorized representative who has indicated his/her understanding and acceptance.     Dental advisory given  Plan Discussed with: CRNA  Anesthesia Plan Comments:        Anesthesia Quick Evaluation

## 2020-04-22 ENCOUNTER — Other Ambulatory Visit: Payer: Self-pay

## 2020-04-22 ENCOUNTER — Inpatient Hospital Stay (HOSPITAL_COMMUNITY): Admitting: Anesthesiology

## 2020-04-22 ENCOUNTER — Observation Stay (HOSPITAL_COMMUNITY)
Admission: RE | Admit: 2020-04-22 | Discharge: 2020-04-23 | Disposition: A | Discharge status: Home / Self Care | Attending: General Surgery | Admitting: General Surgery

## 2020-04-22 ENCOUNTER — Encounter (HOSPITAL_COMMUNITY)
Admission: RE | Disposition: A | Discharge status: Home / Self Care | Payer: Self-pay | Source: Home / Self Care | Attending: General Surgery

## 2020-04-22 ENCOUNTER — Encounter (HOSPITAL_COMMUNITY): Payer: Self-pay | Admitting: General Surgery

## 2020-04-22 ENCOUNTER — Inpatient Hospital Stay (HOSPITAL_COMMUNITY): Admitting: Vascular Surgery

## 2020-04-22 DIAGNOSIS — I10 Essential (primary) hypertension: Secondary | ICD-10-CM | POA: Diagnosis not present

## 2020-04-22 DIAGNOSIS — Z85828 Personal history of other malignant neoplasm of skin: Secondary | ICD-10-CM | POA: Diagnosis not present

## 2020-04-22 DIAGNOSIS — F17210 Nicotine dependence, cigarettes, uncomplicated: Secondary | ICD-10-CM | POA: Insufficient documentation

## 2020-04-22 DIAGNOSIS — K432 Incisional hernia without obstruction or gangrene: Secondary | ICD-10-CM | POA: Diagnosis present

## 2020-04-22 DIAGNOSIS — R7303 Prediabetes: Secondary | ICD-10-CM | POA: Diagnosis not present

## 2020-04-22 HISTORY — PX: INCISIONAL HERNIA REPAIR: SHX193

## 2020-04-22 SURGERY — REPAIR, HERNIA, INCISIONAL
Anesthesia: General | Site: Abdomen

## 2020-04-22 MED ORDER — CHLORHEXIDINE GLUCONATE CLOTH 2 % EX PADS: 6.0000 | MEDICATED_PAD | Freq: Once | CUTANEOUS | Status: DC

## 2020-04-22 MED ORDER — ONDANSETRON 4 MG PO TBDP
4.0000 mg | ORAL_TABLET | Freq: Four times a day (QID) | ORAL | Status: DC | PRN
  Filled 2020-04-22: qty 1

## 2020-04-22 MED ORDER — ENOXAPARIN SODIUM 40 MG/0.4ML ~~LOC~~ SOLN
40.0000 mg | Freq: Two times a day (BID) | SUBCUTANEOUS | Status: DC
  Administered 2020-04-23: 40 mg via SUBCUTANEOUS
  Filled 2020-04-22 (×2): qty 0.4

## 2020-04-22 MED ORDER — GABAPENTIN 300 MG PO CAPS: 300.0000 mg | ORAL_CAPSULE | ORAL | Status: AC

## 2020-04-22 MED ORDER — ACETAMINOPHEN 500 MG PO TABS: 1000.0000 mg | ORAL_TABLET | ORAL | Status: DC

## 2020-04-22 MED ORDER — GABAPENTIN 300 MG PO CAPS
300.0000 mg | ORAL_CAPSULE | Freq: Two times a day (BID) | ORAL | Status: DC
  Administered 2020-04-22 – 2020-04-23 (×3): 300 mg via ORAL
  Filled 2020-04-22 (×3): qty 1

## 2020-04-22 MED ORDER — PROMETHAZINE HCL 25 MG/ML IJ SOLN: 6.2500 mg | INTRAMUSCULAR | Status: DC | PRN

## 2020-04-22 MED ORDER — DIPHENHYDRAMINE HCL 50 MG/ML IJ SOLN: 12.5000 mg | Freq: Four times a day (QID) | INTRAMUSCULAR | Status: DC | PRN

## 2020-04-22 MED ORDER — SUGAMMADEX SODIUM 200 MG/2ML IV SOLN
INTRAVENOUS | Status: DC | PRN
  Administered 2020-04-22: 300 mg via INTRAVENOUS

## 2020-04-22 MED ORDER — BUPIVACAINE LIPOSOME 1.3 % IJ SUSP
20.0000 mL | Freq: Once | INTRAMUSCULAR | Status: DC
  Filled 2020-04-22: qty 20

## 2020-04-22 MED ORDER — TRAMADOL HCL 50 MG PO TABS: 50.0000 mg | ORAL_TABLET | Freq: Four times a day (QID) | ORAL | Status: DC | PRN

## 2020-04-22 MED ORDER — METOPROLOL TARTRATE 5 MG/5ML IV SOLN
5.0000 mg | Freq: Four times a day (QID) | INTRAVENOUS | Status: DC | PRN
  Filled 2020-04-22: qty 5

## 2020-04-22 MED ORDER — CHLORHEXIDINE GLUCONATE 0.12 % MT SOLN
OROMUCOSAL | Status: AC
  Administered 2020-04-22: 15 mL via OROMUCOSAL
  Filled 2020-04-22: qty 15

## 2020-04-22 MED ORDER — ONDANSETRON HCL 4 MG/2ML IJ SOLN
INTRAMUSCULAR | Status: AC
  Filled 2020-04-22: qty 2

## 2020-04-22 MED ORDER — DEXAMETHASONE SODIUM PHOSPHATE 10 MG/ML IJ SOLN
INTRAMUSCULAR | Status: AC
  Filled 2020-04-22: qty 1

## 2020-04-22 MED ORDER — PHENYLEPHRINE 40 MCG/ML (10ML) SYRINGE FOR IV PUSH (FOR BLOOD PRESSURE SUPPORT)
PREFILLED_SYRINGE | INTRAVENOUS | Status: DC | PRN
  Administered 2020-04-22: 40 ug via INTRAVENOUS

## 2020-04-22 MED ORDER — CELECOXIB 200 MG PO CAPS
200.0000 mg | ORAL_CAPSULE | Freq: Two times a day (BID) | ORAL | Status: DC
  Administered 2020-04-22 – 2020-04-23 (×3): 200 mg via ORAL
  Filled 2020-04-22 (×3): qty 1

## 2020-04-22 MED ORDER — DIPHENHYDRAMINE HCL 12.5 MG/5ML PO ELIX
12.5000 mg | ORAL_SOLUTION | Freq: Four times a day (QID) | ORAL | Status: DC | PRN
  Filled 2020-04-22: qty 5

## 2020-04-22 MED ORDER — ROCURONIUM BROMIDE 10 MG/ML (PF) SYRINGE
PREFILLED_SYRINGE | INTRAVENOUS | Status: AC
  Filled 2020-04-22: qty 10

## 2020-04-22 MED ORDER — HYDROMORPHONE HCL 1 MG/ML IJ SOLN
1.0000 mg | INTRAMUSCULAR | Status: DC | PRN
  Administered 2020-04-22: 1 mg via INTRAVENOUS
  Filled 2020-04-22: qty 1

## 2020-04-22 MED ORDER — FENTANYL CITRATE (PF) 100 MCG/2ML IJ SOLN
INTRAMUSCULAR | Status: AC
  Filled 2020-04-22: qty 2

## 2020-04-22 MED ORDER — CEFAZOLIN SODIUM-DEXTROSE 2-4 GM/100ML-% IV SOLN
INTRAVENOUS | Status: AC
  Filled 2020-04-22: qty 100

## 2020-04-22 MED ORDER — ONDANSETRON HCL 4 MG/2ML IJ SOLN: 4.0000 mg | Freq: Four times a day (QID) | INTRAMUSCULAR | Status: DC | PRN

## 2020-04-22 MED ORDER — ORAL CARE MOUTH RINSE: 15.0000 mL | Freq: Once | OROMUCOSAL | Status: AC

## 2020-04-22 MED ORDER — GABAPENTIN 300 MG PO CAPS
ORAL_CAPSULE | ORAL | Status: AC
  Administered 2020-04-22: 300 mg via ORAL
  Filled 2020-04-22: qty 1

## 2020-04-22 MED ORDER — POTASSIUM CHLORIDE IN NACL 20-0.9 MEQ/L-% IV SOLN: INTRAVENOUS | Status: DC

## 2020-04-22 MED ORDER — ZOLPIDEM TARTRATE 5 MG PO TABS: 5.0000 mg | ORAL_TABLET | Freq: Every evening | ORAL | Status: DC | PRN

## 2020-04-22 MED ORDER — ACETAMINOPHEN 500 MG PO TABS
ORAL_TABLET | ORAL | Status: AC
  Filled 2020-04-22: qty 2

## 2020-04-22 MED ORDER — MIDAZOLAM HCL 5 MG/5ML IJ SOLN
INTRAMUSCULAR | Status: DC | PRN
  Administered 2020-04-22: 2 mg via INTRAVENOUS

## 2020-04-22 MED ORDER — PROPOFOL 10 MG/ML IV BOLUS
INTRAVENOUS | Status: AC
  Filled 2020-04-22: qty 20

## 2020-04-22 MED ORDER — MIDAZOLAM HCL 2 MG/2ML IJ SOLN
INTRAMUSCULAR | Status: AC
  Filled 2020-04-22: qty 2

## 2020-04-22 MED ORDER — OXYCODONE HCL 5 MG PO TABS
5.0000 mg | ORAL_TABLET | ORAL | Status: DC | PRN
  Administered 2020-04-22 – 2020-04-23 (×4): 10 mg via ORAL
  Filled 2020-04-22 (×5): qty 2

## 2020-04-22 MED ORDER — BUPIVACAINE LIPOSOME 1.3 % IJ SUSP
INTRAMUSCULAR | Status: DC | PRN
  Administered 2020-04-22: 20 mL

## 2020-04-22 MED ORDER — DEXAMETHASONE SODIUM PHOSPHATE 10 MG/ML IJ SOLN
INTRAMUSCULAR | Status: DC | PRN
  Administered 2020-04-22: 5 mg via INTRAVENOUS

## 2020-04-22 MED ORDER — FENTANYL CITRATE (PF) 250 MCG/5ML IJ SOLN
INTRAMUSCULAR | Status: DC | PRN
  Administered 2020-04-22: 100 ug via INTRAVENOUS

## 2020-04-22 MED ORDER — CHLORHEXIDINE GLUCONATE 0.12 % MT SOLN: 15.0000 mL | Freq: Once | OROMUCOSAL | Status: AC

## 2020-04-22 MED ORDER — LIDOCAINE 2% (20 MG/ML) 5 ML SYRINGE
INTRAMUSCULAR | Status: AC
  Filled 2020-04-22: qty 5

## 2020-04-22 MED ORDER — FENTANYL CITRATE (PF) 250 MCG/5ML IJ SOLN
INTRAMUSCULAR | Status: AC
  Filled 2020-04-22: qty 5

## 2020-04-22 MED ORDER — LACTATED RINGERS IV SOLN: INTRAVENOUS | Status: DC

## 2020-04-22 MED ORDER — ONDANSETRON HCL 4 MG/2ML IJ SOLN
INTRAMUSCULAR | Status: DC | PRN
  Administered 2020-04-22: 4 mg via INTRAVENOUS

## 2020-04-22 MED ORDER — OXYCODONE HCL 5 MG PO TABS: 5.0000 mg | ORAL_TABLET | Freq: Once | ORAL | Status: DC | PRN

## 2020-04-22 MED ORDER — KETOROLAC TROMETHAMINE 15 MG/ML IJ SOLN
INTRAMUSCULAR | Status: DC | PRN
  Administered 2020-04-22: 30 mg via INTRAVENOUS

## 2020-04-22 MED ORDER — LIDOCAINE 2% (20 MG/ML) 5 ML SYRINGE
INTRAMUSCULAR | Status: DC | PRN
  Administered 2020-04-22: 100 mg via INTRAVENOUS

## 2020-04-22 MED ORDER — PANTOPRAZOLE SODIUM 40 MG IV SOLR
40.0000 mg | Freq: Every day | INTRAVENOUS | Status: DC
  Administered 2020-04-22: 40 mg via INTRAVENOUS
  Filled 2020-04-22: qty 40

## 2020-04-22 MED ORDER — ROCURONIUM BROMIDE 10 MG/ML (PF) SYRINGE
PREFILLED_SYRINGE | INTRAVENOUS | Status: DC | PRN
  Administered 2020-04-22: 20 mg via INTRAVENOUS
  Administered 2020-04-22: 60 mg via INTRAVENOUS

## 2020-04-22 MED ORDER — FENTANYL CITRATE (PF) 100 MCG/2ML IJ SOLN
25.0000 ug | INTRAMUSCULAR | Status: DC | PRN
  Administered 2020-04-22 (×3): 50 ug via INTRAVENOUS

## 2020-04-22 MED ORDER — OXYCODONE HCL 5 MG/5ML PO SOLN: 5.0000 mg | Freq: Once | ORAL | Status: DC | PRN

## 2020-04-22 MED ORDER — BUPIVACAINE HCL (PF) 0.25 % IJ SOLN
INTRAMUSCULAR | Status: AC
  Filled 2020-04-22: qty 30

## 2020-04-22 MED ORDER — CEFAZOLIN SODIUM-DEXTROSE 2-4 GM/100ML-% IV SOLN
2.0000 g | INTRAVENOUS | Status: AC
  Administered 2020-04-22: 2 g via INTRAVENOUS

## 2020-04-22 MED ORDER — PROPOFOL 10 MG/ML IV BOLUS
INTRAVENOUS | Status: DC | PRN
  Administered 2020-04-22: 200 mg via INTRAVENOUS

## 2020-04-22 MED ORDER — METHOCARBAMOL 500 MG PO TABS
500.0000 mg | ORAL_TABLET | Freq: Four times a day (QID) | ORAL | Status: DC | PRN
  Administered 2020-04-22: 500 mg via ORAL
  Filled 2020-04-22: qty 1

## 2020-04-22 SURGICAL SUPPLY — 35 items
BINDER ABDOMINAL 12 ML 46-62 (SOFTGOODS) IMPLANT
BINDER ABDOMINAL 12 XL 75-84 (SOFTGOODS) ×2 IMPLANT
BLADE HEX COATED 2.75 (ELECTRODE) ×3 IMPLANT
COVER WAND RF STERILE (DRAPES) IMPLANT
DRAIN CHANNEL RND F F (WOUND CARE) IMPLANT
DRAPE INCISE IOBAN 66X45 STRL (DRAPES) IMPLANT
DRAPE LAPAROSCOPIC ABDOMINAL (DRAPES) ×3 IMPLANT
DRSG OPSITE POSTOP 4X10 (GAUZE/BANDAGES/DRESSINGS) ×2 IMPLANT
DRSG OPSITE POSTOP 4X6 (GAUZE/BANDAGES/DRESSINGS) ×2 IMPLANT
DRSG PAD ABDOMINAL 8X10 ST (GAUZE/BANDAGES/DRESSINGS) IMPLANT
ELECT REM PT RETURN 15FT ADLT (MISCELLANEOUS) ×3 IMPLANT
EVACUATOR SILICONE 100CC (DRAIN) IMPLANT
GAUZE SPONGE 4X4 12PLY STRL (GAUZE/BANDAGES/DRESSINGS) ×3 IMPLANT
GLOVE BIOGEL PI IND STRL 7.0 (GLOVE) ×1 IMPLANT
GLOVE BIOGEL PI INDICATOR 7.0 (GLOVE) ×2
GLOVE ECLIPSE 8.0 STRL XLNG CF (GLOVE) ×3 IMPLANT
GLOVE INDICATOR 8.0 STRL GRN (GLOVE) ×6 IMPLANT
GOWN STRL REUS W/TWL LRG LVL3 (GOWN DISPOSABLE) ×3 IMPLANT
GOWN STRL REUS W/TWL XL LVL3 (GOWN DISPOSABLE) ×6 IMPLANT
KIT BASIN OR (CUSTOM PROCEDURE TRAY) ×3 IMPLANT
KIT TURNOVER KIT A (KITS) IMPLANT
MESH VENTRALIGHT ST 4X6IN (Mesh General) ×2 IMPLANT
NS IRRIG 1000ML POUR BTL (IV SOLUTION) ×3 IMPLANT
PACK GENERAL/GYN (CUSTOM PROCEDURE TRAY) ×3 IMPLANT
PENCIL SMOKE EVACUATOR (MISCELLANEOUS) IMPLANT
SPONGE LAP 4X18 RFD (DISPOSABLE) IMPLANT
STAPLER VISISTAT 35W (STAPLE) ×3 IMPLANT
SUT ETHILON 3 0 PS 1 (SUTURE) IMPLANT
SUT NOVA 1 T20/GS 25DT (SUTURE) ×8 IMPLANT
SUT NOVA NAB GS-21 0 18 T12 DT (SUTURE) IMPLANT
SUT PDS AB 1 CTX 36 (SUTURE) IMPLANT
SUT VIC AB 2-0 CT1 27 (SUTURE) ×6
SUT VIC AB 2-0 CT1 TAPERPNT 27 (SUTURE) IMPLANT
TOWEL OR 17X26 10 PK STRL BLUE (TOWEL DISPOSABLE) ×3 IMPLANT
TRAY FOLEY MTR SLVR 16FR STAT (SET/KITS/TRAYS/PACK) IMPLANT

## 2020-04-22 NOTE — Transfer of Care (Signed)
Immediate Anesthesia Transfer of Care Note  Patient: Kevin Young  Procedure(s) Performed: HERNIA REPAIR INCISIONAL WITH MESH (N/A Abdomen)  Patient Location: PACU  Anesthesia Type:General  Level of Consciousness: awake, alert  and oriented  Airway & Oxygen Therapy: Patient Spontanous Breathing and Patient connected to face mask oxygen  Post-op Assessment: Report given to RN and Post -op Vital signs reviewed and stable  Post vital signs: Reviewed and stable  Last Vitals:  Vitals Value Taken Time  BP 128/84 04/22/20 1121  Temp 36.7 C 04/22/20 1121  Pulse 69 04/22/20 1124  Resp 18 04/22/20 1124  SpO2 98 % 04/22/20 1124  Vitals shown include unvalidated device data.  Last Pain:  Vitals:   04/22/20 0805  TempSrc:   PainSc: 0-No pain      Patients Stated Pain Goal: 3 (85/46/27 0350)  Complications: No complications documented.

## 2020-04-22 NOTE — Anesthesia Postprocedure Evaluation (Signed)
Anesthesia Post Note  Patient: Kevin Young  Procedure(s) Performed: HERNIA REPAIR INCISIONAL WITH MESH (N/A Abdomen)     Patient location during evaluation: PACU Anesthesia Type: General Level of consciousness: awake and alert and oriented Pain management: pain level controlled Vital Signs Assessment: post-procedure vital signs reviewed and stable Respiratory status: spontaneous breathing, nonlabored ventilation and respiratory function stable Cardiovascular status: blood pressure returned to baseline Postop Assessment: no apparent nausea or vomiting Anesthetic complications: no   No complications documented.  Last Vitals:  Vitals:   04/22/20 1245 04/22/20 1300  BP: 133/85 117/81  Pulse: 72 72  Resp: 20 17  Temp: 36.6 C 36.6 C  SpO2: 94% 94%    Last Pain:  Vitals:   04/22/20 1300  TempSrc:   PainSc: [place]

## 2020-04-22 NOTE — Op Note (Signed)
  04/22/2020  11:18 AM  PATIENT:  Kevin Young  64 y.o. male  PRE-OPERATIVE DIAGNOSIS:  incisional hernia  POST-OPERATIVE DIAGNOSIS:  incisional hernia  PROCEDURE:  Procedure(s): HERNIA REPAIR INCISIONAL WITH MESH 15CM ROUND VENTRALIGHT  SURGEON:  Surgeon(s): [name], MD  ASSISTANTS: Armandina Gemma, MD  ANESTHESIA:   Local and general  EBL:  Total I/O In: 900 [I.V.:800; IV Piggyback:100] Out: 100 [Urine:100]  BLOOD ADMINISTERED:none  DRAINS: none   SPECIMEN:  Excision  DISPOSITION OF SPECIMEN:  PATHOLOGY  COUNTS:  YES  DICTATION: .Dragon Dictation Findings: Reducible incisional hernia containing small bowel  Procedure in detail: Informed consent was obtained.  He received intravenous antibiotics.  He was brought to the operating room and general endotracheal anesthesia was administered by the anesthesia staff.  His abdomen was prepped and draped in sterile fashion after nursing placed a Foley catheter.  A timeout procedure was performed.  Lower midline incision was made.  Subcutaneous tissues were dissected down and I entered the area around the hernia.  This was circumferentially dissected from the surrounding soft tissues down to the fascia.  Hemostasis was obtained using cautery.  I then carefully opened the sac.  The sac was then excised from the edge of the fascia circumferentially.  Again we used cautery and got good hemostasis.  The bowel reduced easily and there were no complicating features of the bowel.  It was without significant adhesions.  We then repaired the hernia in an inlay fashion with a 15 cm round ventral light fascia.  This was secured with at least 4 cm circumferential underlay using interrupted #1 Novafil sutures.  I was then able to close the fascia on top of the mesh with interrupted Novafil sutures.  The area was copiously irrigated.  Hemostasis was ensured.  Exparel was injected, 20 cc total in the muscle and subcutaneous tissue.  Next,  subcutaneous tissues were approximated with interrupted 2-0 Vicryl sutures.  The skin was closed with staples.  Sponge needle and instrument counts were correct.  A sterile dressing was applied followed by an abdominal binder.  He tolerated procedure well without apparent complication and was taken recovery in stable condition. PATIENT DISPOSITION:  PACU - hemodynamically stable.   Delay start of Pharmacological VTE agent (>24hrs) due to surgical blood loss or risk of bleeding:  no  [name], MD, MPH, FACS Pager: [phone]  7/20/202111:18 AM

## 2020-04-22 NOTE — Anesthesia Procedure Notes (Signed)
Procedure Name: Intubation Date/Time: 04/22/2020 9:43 AM Performed by: Malachi Paradise, RN Pre-anesthesia Checklist: Patient identified, Emergency Drugs available, Suction available and Patient being monitored Patient Re-evaluated:Patient Re-evaluated prior to induction Oxygen Delivery Method: Circle system utilized Preoxygenation: Pre-oxygenation with 100% oxygen Induction Type: IV induction Ventilation: Mask ventilation without difficulty Laryngoscope Size: Mac and 4 Grade View: Grade I Tube type: Oral Tube size: 7.5 mm Number of attempts: 1 Airway Equipment and Method: Stylet and Oral airway Placement Confirmation: ETT inserted through vocal cords under direct vision,  positive ETCO2 and breath sounds checked- equal and bilateral Secured at: 22 cm Tube secured with: Tape Dental Injury: Teeth and Oropharynx as per pre-operative assessment

## 2020-04-22 NOTE — H&P (Signed)
Kevin Young is an 64 y.o. male.   Chief Complaint: Incisional hernia HPI: Presents for repair of incisional hernia with mesh.  No change in symptoms since I saw him in the office.  Past Medical History:  Diagnosis Date  . Allergic rhinitis   . Anxiety and depression   . BPH (benign prostatic hyperplasia)    Nocturia is his primary symptom:  Takes Flomax  . Diverticulitis large intestine 2015/16   Recurrent: GI MD [name].  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  . Eustachian tube dysfunction    with recurrent "sinus issues"--ENT (Dr. [name]) recommended surgery for nasal turbinate reduction and septoplasty--as of 11/03/15)  . GERD (gastroesophageal reflux disease)   . Hepatic steatosis   . History of adenomatous polyp of colon   . Hyperlipidemia   . Hypertension    Started med 03/2018  . IBS (irritable bowel syndrome)   . Incisional hernia 2021   from where he had sigmoid colectomy  . Inguinal pain, left    Chronic; [name] to do MRI pelvis w/out contrast 08/2017 but insurance denied it.  . Nephrolithiasis 12/23/14   37mm nonobstructive R renal calc on CT abd/pelv done for diverticulitis  . Obesity, Class II, BMI 35-39.9   . OSA (obstructive sleep apnea)   . 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 Prediabetes    HbA1c 6.0% in 2008 and in 2018.  . Skin cancer    Basal cell removed from left hand    Past Surgical History:  Procedure Laterality Date  . COLONOSCOPY  10/17/2002;  07/26/13   Hx of polyps.  Recall 07/2017 per [name]  . COLONOSCOPY  08/24/2017   Diverticulosis from transverse colon to sigmoid.  Otherwise normal.  Repeat in 5-10 years for screening/[name]  . ESOPHAGOGASTRODUODENOSCOPY     need specifics from pt  . HERNIA REPAIR     multiple.  Right inguinal repair by Dr. [name] in 2004.  07/2017 having some muscular/nerve pain in groin--evaluated by Dr. Alice Reichert w/u at that  time.  [name] LAPAROSCOPIC SIGMOID COLECTOMY N/A 09/15/2015   Procedure: LAPAROSCOPIC ASSISTED  SIGMOID COLECTOMY;  Surgeon: [name], MD;  Location: [place];  Service: General;  Laterality: N/A;  . TONSILLECTOMY      Family History  Problem Relation Age of Onset  . GI problems Mother   . Cancer Father        skin  . Hypertension Father   . Glaucoma Father   . Migraines Sister   . Seizures Brother   . Depression Brother    Social History:  reports that he quit smoking about 10 years ago. His smoking use included cigars. He quit smokeless tobacco use about 6 years ago.  His smokeless tobacco use included chew. He reports current alcohol use of about 5.0 standard drinks of alcohol per week. He reports that he does not use drugs.  Allergies: No Known Allergies  Medications Prior to Admission  Medication Sig Dispense Refill  . acetaminophen (TYLENOL) 325 MG tablet Take 650 mg by mouth every 6 (six) hours as needed (for pain.).    [name] amLODipine (NORVASC) 5 MG tablet Take 1 tablet (5 mg total) by mouth daily. (Patient taking differently: Take 5 mg by mouth at bedtime. ) 90 tablet 3  . APPLE CIDER VINEGAR PO Take 1 capsule by mouth daily.     [name] atorvastatin (LIPITOR) 20 MG tablet  Take 0.5 tablets (10 mg total) by mouth daily. 45 tablet 3  . Melatonin 5 MG CAPS Take 10 mg by mouth at bedtime.     . Menthol, Topical Analgesic, (BENGAY EX) Apply 1 application topically 4 (four) times daily as needed (knee pain.).    [name] Misc Natural Products (PROSTATE) CAPS Take 2 capsules by mouth at bedtime.    . Multiple Vitamins-Minerals (MULTIVITAMIN WITH MINERALS) tablet Take 1 tablet by mouth daily.    . Omega-3 Fatty Acids (FISH OIL) 1200 MG CAPS Take 2,400 mg by mouth in the morning and at bedtime.    . pantoprazole (PROTONIX) 40 MG tablet TAKE 1 TABLET BY MOUTH EVERY DAY BEFORE BREAKFAST (Patient taking differently: Take 40 mg by mouth daily before breakfast. ) 90 tablet 3    No results found for this  or any previous visit (from the past 48 hour(s)). No results found.  Review of Systems  Blood pressure (!) 155/74, pulse 72, temperature 98.3 F (36.8 C), temperature source Oral, resp. rate 18, height 5' 5.5" (1.664 m), weight 116.1 kg, SpO2 97 %. Physical Exam Constitutional:      Appearance: Normal appearance.  HENT:     Head: Normocephalic.     Mouth/Throat:     Mouth: Mucous membranes are dry.  Eyes:     General: No scleral icterus. Cardiovascular:     Rate and Rhythm: Normal rate and regular rhythm.     Pulses: Normal pulses.     Heart sounds: Normal heart sounds.  Pulmonary:     Effort: No respiratory distress.     Breath sounds: No stridor. No rhonchi.  Abdominal:     Comments: Large left lower quadrant incisional hernia incompletely reduces  Musculoskeletal:        General: No swelling or tenderness.     Cervical back: Normal range of motion and neck supple.  Skin:    General: Skin is warm and dry.  Neurological:     Mental Status: He is alert and oriented to person, place, and time.  Psychiatric:        Mood and Affect: Mood normal.      Assessment/Plan Incisional hernia -for open repair of incisional hernia with mesh.  I again discussed the procedure, risks, benefits, and expected postoperative course with him.  He is agreeable.ERAS.  Zenovia Jarred, MD 04/22/2020, 9:19 AM

## 2020-04-23 ENCOUNTER — Encounter (HOSPITAL_COMMUNITY): Payer: Self-pay | Admitting: General Surgery

## 2020-04-23 LAB — BASIC METABOLIC PANEL
Anion gap: 11 (ref 5–15)
BUN: 21 mg/dL (ref 8–23)
CO2: 26 mmol/L (ref 22–32)
Calcium: 9.5 mg/dL (ref 8.9–10.3)
Chloride: 103 mmol/L (ref 98–111)
Creatinine, Ser: 1.4 mg/dL — ABNORMAL HIGH (ref 0.61–1.24)
GFR calc Af Amer: >60 mL/min (ref >60)
GFR calc non Af Amer: 53 mL/min — ABNORMAL LOW (ref >60)
Glucose, Bld: 130 mg/dL — ABNORMAL HIGH (ref 70–99)
Potassium: 4.4 mmol/L (ref 3.5–5.1)
Sodium: 140 mmol/L (ref 135–145)

## 2020-04-23 LAB — SURGICAL PATHOLOGY

## 2020-04-23 LAB — CBC
HCT: 45.4 % (ref 39.0–52.0)
Hemoglobin: 15.4 g/dL (ref 13.0–17.0)
MCH: 31.6 pg (ref 26.0–34.0)
MCHC: 33.9 g/dL (ref 30.0–36.0)
MCV: 93.2 fL (ref 80.0–100.0)
Platelets: 220 10*3/uL (ref 150–400)
RBC: 4.87 MIL/uL (ref 4.22–5.81)
RDW: 13.1 % (ref 11.5–15.5)
WBC: 17.9 10*3/uL — ABNORMAL HIGH (ref 4.0–10.5)
nRBC: 0 % (ref 0.0–0.2)

## 2020-04-23 MED ORDER — OXYCODONE HCL 5 MG PO TABS: 5.0000 mg | ORAL_TABLET | ORAL | 0 refills | Status: DC | PRN

## 2020-04-23 MED ORDER — METHOCARBAMOL 500 MG PO TABS: 500.0000 mg | ORAL_TABLET | Freq: Three times a day (TID) | ORAL | 0 refills | Status: DC | PRN

## 2020-04-23 NOTE — Plan of Care (Signed)
Pt doing well. Pt given D/C instructions with verbal understanding. Rx's were sent to the pharmacy by MD. Pt's incision is clean and dry with no sign of infection. Pt's IV was removed prior to D/C. Pt D/C'd home via wheelchair per MD order. Pt is stable @ D/C and has no other needs at this time. Mahasin Riviere, RN  

## 2020-04-24 NOTE — Discharge Summary (Signed)
Physician Discharge Summary  Patient ID: Kevin Young MRN: [id number] DOB/AGE: July 27, 1956 64 y.o.  Admit date: 04/22/2020 Discharge date: 04/24/2020  Admission Diagnoses: Incisional hernia  Discharge Diagnoses: Status post open repair of incisional hernia with mesh Active Problems:   Incisional hernia   Discharged Condition: good  Hospital Course: Patient underwent uncomplicated open repair of incisional hernia with mesh.  Postoperatively he did very well.  He progressed with diet advancement and ambulation.  Pain was controlled with oral medications and he was discharged on postoperative day 1.  Consults: None  Significant Diagnostic Studies: .   Treatments: Surgery  Discharge Exam: Blood pressure 128/68, pulse 77, temperature 98.1 F (36.7 C), temperature source Oral, resp. rate 17, height 5' 5.5" (1.664 m), weight 116.1 kg, SpO2 94 %. General appearance: alert and cooperative Resp: clear to auscultation bilaterally Cardio: regular rate and rhythm GI: Soft, nontender, dry staining on dressing, active bowel sounds  Disposition: Discharge disposition: 01-Home or Self Care       Discharge Instructions    Call MD for:  redness, tenderness, or signs of infection (pain, swelling, redness, odor or green/yellow discharge around incision site)   Complete by: As directed    Call MD for:  severe uncontrolled pain   Complete by: As directed    Diet - low sodium heart healthy   Complete by: As directed    Discharge instructions   Complete by: As directed    CCS _______[facility]  UMBILICAL OR INGUINAL HERNIA REPAIR: POST OP INSTRUCTIONS  Always review your discharge instruction sheet given to you by the facility where your surgery was performed. IF YOU HAVE DISABILITY OR FAMILY LEAVE FORMS, YOU MUST BRING THEM TO THE OFFICE FOR PROCESSING.   DO NOT GIVE THEM TO YOUR DOCTOR.  1. A  prescription for pain medication may be given to you upon  discharge.  Take your pain medication as prescribed, if needed.  If narcotic pain medicine is not needed, then you may take acetaminophen (Tylenol) or ibuprofen (Advil) as needed. 2. Take your usually prescribed medications unless otherwise directed. If you need a refill on your pain medication, please contact your pharmacy.  They will contact our office to request authorization. Prescriptions will not be filled after 5 pm or on week-ends. 3. You should follow a light diet the first 24 hours after arrival home, such as soup and crackers, etc.  Be sure to include lots of fluids daily.  Resume your normal diet the day after surgery. 4.Most patients will experience some swelling and bruising around the umbilicus or in the groin and scrotum.  Ice packs and reclining will help.  Swelling and bruising can take several days to resolve.  6. It is common to experience some constipation if taking pain medication after surgery.  Increasing fluid intake and taking a stool softener (such as Colace) will usually help or prevent this problem from occurring.  A mild laxative (Milk of Magnesia or Miralax) should be taken according to package directions if there are no bowel movements after 48 hours. 7. Unless discharge instructions indicate otherwise, you may remove your bandages 24-48 hours after surgery, and you may shower at that time.  You may have steri-strips (small skin tapes) in place directly over the incision.  These strips should be left on the skin for 7-10 days.  If your surgeon used skin glue on the incision, you may shower in 24 hours.  The glue will flake off over the next 2-3  weeks.  Any sutures or staples will be removed at the office during your follow-up visit. 8. ACTIVITIES:  You may resume regular (light) daily activities beginning the next day-such as daily self-care, walking, climbing stairs-gradually increasing activities as tolerated.  You may have sexual intercourse when it is comfortable.  Refrain  from any heavy lifting or straining until approved by your doctor.  a.You may drive when you are no longer taking prescription pain medication, you can comfortably wear a seatbelt, and you can safely maneuver your car and apply brakes. b.RETURN TO WORK:   _____________________________________________  9.You should see your doctor in the office for a follow-up appointment approximately 2-3 weeks after your surgery.  Make sure that you call for this appointment within a day or two after you arrive home to insure a convenient appointment time. 10.OTHER INSTRUCTIONS: _________________________    _____________________________________  WHEN TO CALL YOUR DOCTOR: Fever over 101.0 Inability to urinate Nausea and/or vomiting Extreme swelling or bruising Continued bleeding from incision. Increased pain, redness, or drainage from the incision  The clinic staff is available to answer your questions during regular business hours.  Please don't hesitate to call and ask to speak to one of the nurses for clinical concerns.  If you have a medical emergency, go to the nearest emergency room or call 911.  A surgeon from [facility] is always on call at the hospital   [address], [place], [place]  [address] ?  [address] [place], [place], [place]   [address] [phone] ? [phone] ? FAX [phone] Web site: [email or web address]   Discharge wound care:   Complete by: As directed    Remove dressing 7/23 and you may shower then. Replace with a gauze if any drainage.   Increase activity slowly   Complete by: As directed    Lifting restrictions   Complete by: As directed    No lifting over 10lbs for 6 weeks     Allergies as of 04/23/2020   No Known Allergies     Medication List    TAKE these medications   acetaminophen 325 MG tablet Commonly known as: TYLENOL Take 650 mg by mouth every 6 (six) hours as needed (for pain.).   amLODipine 5 MG  tablet Commonly known as: NORVASC Take 1 tablet (5 mg total) by mouth daily. What changed: when to take this   APPLE CIDER VINEGAR PO Take 1 capsule by mouth daily.   atorvastatin 20 MG tablet Commonly known as: LIPITOR Take 0.5 tablets (10 mg total) by mouth daily.   BENGAY EX Apply 1 application topically 4 (four) times daily as needed (knee pain.).   Fish Oil 1200 MG Caps Take 2,400 mg by mouth in the morning and at bedtime.   Melatonin 5 MG Caps Take 10 mg by mouth at bedtime.   methocarbamol 500 MG tablet Commonly known as: ROBAXIN Take 1 tablet (500 mg total) by mouth every 8 (eight) hours as needed for muscle spasms.   multivitamin with minerals tablet Take 1 tablet by mouth daily.   oxyCODONE 5 MG immediate release tablet Commonly known as: Oxy IR/ROXICODONE Take 1-2 tablets (5-10 mg total) by mouth every 4 (four) hours as needed for severe pain.   pantoprazole 40 MG tablet Commonly known as: PROTONIX TAKE 1 TABLET BY MOUTH EVERY DAY BEFORE BREAKFAST What changed:   how much to take  how to take this  when to take this  additional instructions   Prostate  Caps Take 2 capsules by mouth at bedtime.            Discharge Care Instructions  (From admission, onward)         Start     Ordered   04/23/20 0000  Discharge wound care:       Comments: Remove dressing 7/23 and you may shower then. Replace with a gauze if any drainage.   04/23/20 1129          Follow-up Information    [name], MD Follow up.   Specialty: General Surgery Why: as scheduled Contact information: [facility] [place] [address] [phone]               Signed: Zenovia Jarred 04/24/2020, 6:41 AM

## 2020-05-03 ENCOUNTER — Encounter: Payer: Self-pay | Admitting: Family Medicine

## 2020-07-04 ENCOUNTER — Encounter: Payer: Self-pay | Admitting: Family Medicine

## 2020-11-08 ENCOUNTER — Other Ambulatory Visit: Payer: Self-pay | Admitting: Family Medicine

## 2020-11-11 ENCOUNTER — Telehealth: Payer: Self-pay

## 2020-11-11 MED ORDER — AMLODIPINE BESYLATE 5 MG PO TABS: ORAL_TABLET | ORAL | 0 refills | Status: DC

## 2020-11-11 MED ORDER — ATORVASTATIN CALCIUM 20 MG PO TABS: ORAL_TABLET | ORAL | 0 refills | Status: DC

## 2020-11-11 MED ORDER — PANTOPRAZOLE SODIUM 40 MG PO TBEC: DELAYED_RELEASE_TABLET | ORAL | 0 refills | Status: DC

## 2020-11-11 NOTE — Telephone Encounter (Signed)
Rx sent to pharmacy   

## 2020-11-11 NOTE — Telephone Encounter (Signed)
Patient came into office to schedule appt with Dr. Anitra Lauth.  He stated that he received notification his prescriptions could not be filled until office visit was scheduled with his provider. I scheduled an appt for patient with Dr. Anitra Lauth, next available appt, on 11/17/20 at 11:30AM. Patient will run out of meds before appt. Can he have enough meds called into pharmacy to get him thru until appt on 2/14?  amLODipine (NORVASC) 5 MG tablet [[id number]]   atorvastatin (LIPITOR) 20 MG tablet [[id number]]   pantoprazole (PROTONIX) 40 MG tablet [[id number]]    [id number] - [place], [place]

## 2020-11-17 ENCOUNTER — Encounter: Payer: Self-pay | Admitting: Family Medicine

## 2020-11-17 ENCOUNTER — Other Ambulatory Visit: Payer: Self-pay

## 2020-11-17 ENCOUNTER — Ambulatory Visit (INDEPENDENT_AMBULATORY_CARE_PROVIDER_SITE_OTHER): Admitting: Family Medicine

## 2020-11-17 VITALS — BP 142/86 | HR 74 | Temp 98.1°F | Resp 16 | Ht 65.0 in | Wt 253.4 lb

## 2020-11-17 DIAGNOSIS — E7800 Pure hypercholesterolemia, unspecified: Secondary | ICD-10-CM

## 2020-11-17 DIAGNOSIS — Z0000 Encounter for general adult medical examination without abnormal findings: Secondary | ICD-10-CM

## 2020-11-17 DIAGNOSIS — I10 Essential (primary) hypertension: Secondary | ICD-10-CM

## 2020-11-17 DIAGNOSIS — Z125 Encounter for screening for malignant neoplasm of prostate: Secondary | ICD-10-CM

## 2020-11-17 DIAGNOSIS — Z23 Encounter for immunization: Secondary | ICD-10-CM

## 2020-11-17 DIAGNOSIS — R7303 Prediabetes: Secondary | ICD-10-CM

## 2020-11-17 LAB — CBC WITH DIFFERENTIAL/PLATELET
Basophils Absolute: 0 10*3/uL (ref 0.0–0.1)
Basophils Relative: 0.5 % (ref 0.0–3.0)
Eosinophils Absolute: 0.2 10*3/uL (ref 0.0–0.7)
Eosinophils Relative: 3.3 % (ref 0.0–5.0)
HCT: 45.4 % (ref 39.0–52.0)
Hemoglobin: 15.5 g/dL (ref 13.0–17.0)
Lymphocytes Relative: 33.2 % (ref 12.0–46.0)
Lymphs Abs: 2.4 10*3/uL (ref 0.7–4.0)
MCHC: 34.2 g/dL (ref 30.0–36.0)
MCV: 91.1 fl (ref 78.0–100.0)
Monocytes Absolute: 0.9 10*3/uL (ref 0.1–1.0)
Monocytes Relative: 11.9 % (ref 3.0–12.0)
Neutro Abs: 3.7 10*3/uL (ref 1.4–7.7)
Neutrophils Relative %: 51.1 % (ref 43.0–77.0)
Platelets: 194 10*3/uL (ref 150.0–400.0)
RBC: 4.98 Mil/uL (ref 4.22–5.81)
RDW: 13.4 % (ref 11.5–15.5)
WBC: 7.3 10*3/uL (ref 4.0–10.5)

## 2020-11-17 LAB — COMPREHENSIVE METABOLIC PANEL
ALT: 34 U/L (ref 0–53)
AST: 25 U/L (ref 0–37)
Albumin: 4.4 g/dL (ref 3.5–5.2)
Alkaline Phosphatase: 62 U/L (ref 39–117)
BUN: 17 mg/dL (ref 6–23)
CO2: 26 mEq/L (ref 19–32)
Calcium: 9.7 mg/dL (ref 8.4–10.5)
Chloride: 105 mEq/L (ref 96–112)
Creatinine, Ser: 1.05 mg/dL (ref 0.40–1.50)
GFR: 75.54 mL/min (ref >60.00)
Glucose, Bld: 96 mg/dL (ref 70–99)
Potassium: 4.2 mEq/L (ref 3.5–5.1)
Sodium: 141 mEq/L (ref 135–145)
Total Bilirubin: 0.5 mg/dL (ref 0.2–1.2)
Total Protein: 7.2 g/dL (ref 6.0–8.3)

## 2020-11-17 LAB — PSA: PSA: 0.65 ng/mL (ref 0.10–4.00)

## 2020-11-17 LAB — LIPID PANEL
Cholesterol: 161 mg/dL (ref 0–200)
HDL: 58.4 mg/dL (ref >39.00)
LDL Cholesterol: 66 mg/dL (ref 0–99)
NonHDL: 102.9
Total CHOL/HDL Ratio: 3
Triglycerides: 185 mg/dL — ABNORMAL HIGH (ref 0.0–149.0)
VLDL: 37 mg/dL (ref 0.0–40.0)

## 2020-11-17 LAB — HEMOGLOBIN A1C: Hgb A1c MFr Bld: 5.9 % (ref 4.6–6.5)

## 2020-11-17 MED ORDER — AMLODIPINE BESY-BENAZEPRIL HCL 10-20 MG PO CAPS: 1.0000 | ORAL_CAPSULE | Freq: Every day | ORAL | 1 refills | Status: DC

## 2020-11-17 NOTE — Progress Notes (Signed)
Office Note 11/17/2020  CC:  Chief Complaint  Patient presents with  . Follow-up    RCI, pt is not fasting    HPI:  Kevin Young is a 65 y.o. White male who is here accompanied by his wife for annual health maintenance exam and f/u HTN, HLD, prediabetes.  Says not doing very good, not active, stressed out, not eating right.  His wife has several chronic illnesses and he's taking care of her constantly. BP over 465 systolic at dentist recently, also similar at home recently.    Nocturia x 3 per night. No other lower urinary tract obst sx's. No polydipsia.  No blurry vision.  Has abd wall hernia issues still, seeing his surgeon regularly to follow these. Admits he drinking too much alcohol: says 6-8 oz whisky per day, denies beer or other hard liquor intake.  Past Medical History:  Diagnosis Date  . Allergic rhinitis   . Anxiety and depression   . BPH (benign prostatic hyperplasia)    Nocturia is his primary symptom:  Takes Flomax  . Diverticulitis large intestine 2015/16   Recurrent: GI MD [name].  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  . Eustachian tube dysfunction    with recurrent "sinus issues"--ENT (Dr. [name]) recommended surgery for nasal turbinate reduction and septoplasty--as of 11/03/15)  . GERD (gastroesophageal reflux disease)   . Hepatic steatosis   . History of adenomatous polyp of colon   . Hyperlipidemia   . Hypertension    Started med 03/2018  . IBS (irritable bowel syndrome)   . Inguinal pain, left    Chronic; [name] to do MRI pelvis w/out contrast 08/2017 but insurance denied it.  . Nephrolithiasis 12/23/14   74mm nonobstructive R renal calc on CT abd/pelv done for diverticulitis  . Obesity, Class II, BMI 35-39.9   . OSA (obstructive sleep apnea)   . 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 Prediabetes    HbA1c 6.0% in 2008 and in 2018.  . Skin cancer    Basal cell  removed from left hand    Past Surgical History:  Procedure Laterality Date  . COLONOSCOPY  10/17/2002;  07/26/13   Hx of polyps.  Recall 07/2017 per [name]  . COLONOSCOPY  08/24/2017   Diverticulosis from transverse colon to sigmoid.  Otherwise normal.  Repeat in 5-10 years for screening/[name]  . ESOPHAGOGASTRODUODENOSCOPY     need specifics from pt  . HERNIA REPAIR     multiple.  Right inguinal repair by Dr. [name] in 2004.  07/2017 having some muscular/nerve pain in groin--evaluated by Dr. Alice Reichert w/u at that time.  Fatima Blank HERNIA REPAIR N/A 04/22/2020   Procedure: HERNIA REPAIR INCISIONAL WITH MESH;  Surgeon: [name], MD;  Location: [facility];  Service: General;  Laterality: N/A;  . LAPAROSCOPIC SIGMOID COLECTOMY N/A 09/15/2015   Procedure: LAPAROSCOPIC ASSISTED  SIGMOID COLECTOMY;  Surgeon: [name], MD;  Location: [facility];  Service: General;  Laterality: N/A;  . TONSILLECTOMY      Family History  Problem Relation Age of Onset  . GI problems Mother   . Cancer Father        skin  . Hypertension Father   . Glaucoma Father   . Migraines Sister   . Seizures Brother   . Depression Brother     Social History   Socioeconomic History  . Marital status: Married  Spouse name: Not on file  . Number of children: Not on file  . Years of education: Not on file  . Highest education level: Not on file  Occupational History  . Occupation: truck Education administrator: [employer]  Tobacco Use  . Smoking status: Former Smoker    Types: Cigars    Quit date: 12/02/2009    Years since quitting: 10.9  . Smokeless tobacco: Former Systems developer    Types: [place] date: 08/23/2013  Vaping Use  . Vaping Use: Never used  Substance and Sexual Activity  . Alcohol use: Yes    Alcohol/week: 5.0 standard drinks    Types: 5 Cans of beer per week    Comment: 7-10 a week  . Drug use: No  . Sexual activity: Not on file  Other Topics Concern  . Not on file   Social History Narrative   Married, step children.   Orig from [facility].   Occupation: delivery driver.   Former Facilities manager tob user, quit 2010.   Alcohol: 1 beer a day.   Social Determinants of Health   Financial Resource Strain: Not on file  Food Insecurity: Not on file  Transportation Needs: Not on file  Physical Activity: Not on file  Stress: Not on file  Social Connections: Not on file  Intimate Partner Violence: Not on file    Outpatient Medications Prior to Visit  Medication Sig Dispense Refill  . acetaminophen (TYLENOL) 325 MG tablet Take 650 mg by mouth every 6 (six) hours as needed (for pain.).    [name] atorvastatin (LIPITOR) 20 MG tablet TAKE 1/2 TABLET(10 MG) BY MOUTH DAILY 5 tablet 0  . Melatonin 5 MG CAPS Take 10 mg by mouth at bedtime.     . methocarbamol (ROBAXIN) 500 MG tablet Take 1 tablet (500 mg total) by mouth every 8 (eight) hours as needed for muscle spasms. 30 tablet 0  . Misc Natural Products (PROSTATE) CAPS Take 2 capsules by mouth at bedtime.    . Multiple Vitamins-Minerals (MULTIVITAMIN WITH MINERALS) tablet Take 1 tablet by mouth daily.    . Omega-3 Fatty Acids (FISH OIL) 1200 MG CAPS Take 2,400 mg by mouth in the morning and at bedtime.    . pantoprazole (PROTONIX) 40 MG tablet TAKE 1 TABLET BY MOUTH EVERY DAY BEFORE BREAKFAST 7 tablet 0  . amLODipine (NORVASC) 5 MG tablet TAKE 1 TABLET(5 MG) BY MOUTH DAILY 7 tablet 0  . APPLE CIDER VINEGAR PO Take 1 capsule by mouth daily.  (Patient not taking: Reported on 11/17/2020)    . Menthol, Topical Analgesic, (BENGAY EX) Apply 1 application topically 4 (four) times daily as needed (knee pain.). (Patient not taking: Reported on 11/17/2020)    . oxyCODONE (OXY IR/ROXICODONE) 5 MG immediate release tablet Take 1-2 tablets (5-10 mg total) by mouth every 4 (four) hours as needed for severe pain. (Patient not taking: Reported on 11/17/2020) 30 tablet 0   No facility-administered medications prior to visit.    No  Known Allergies  ROS Review of Systems  Constitutional: Negative for appetite change, chills, fatigue and fever.  HENT: Negative for congestion, dental problem, ear pain and sore throat.   Eyes: Negative for discharge, redness and visual disturbance.  Respiratory: Negative for cough, chest tightness, shortness of breath and wheezing.   Cardiovascular: Negative for chest pain, palpitations and leg swelling.  Gastrointestinal: Negative for abdominal pain, blood in stool, diarrhea, nausea and vomiting.  Genitourinary: Negative for difficulty  urinating, dysuria, flank pain, frequency, hematuria and urgency.  Musculoskeletal: Negative for arthralgias, back pain, joint swelling, myalgias and neck stiffness.  Skin: Negative for pallor and rash.  Neurological: Negative for dizziness, speech difficulty, weakness and headaches.  Hematological: Negative for adenopathy. Does not bruise/bleed easily.  Psychiatric/Behavioral: Negative for confusion and sleep disturbance. The patient is not nervous/anxious.     PE; Vitals with BMI 11/17/2020 04/23/2020 04/23/2020  Height 5\' 5"  - -  Weight 253 lbs 6 oz - -  BMI 16.10 - -  Systolic 960 454 098  Diastolic 86 68 74  Pulse 74 77 70    Gen: Alert, well appearing.  Patient is oriented to person, place, time, and situation. AFFECT: pleasant, lucid thought and speech. ENT: Ears: EACs clear, normal epithelium.  TMs with good light reflex and landmarks bilaterally.  Eyes: no injection, icteris, swelling, or exudate.  EOMI, PERRLA. Nose: no drainage or turbinate edema/swelling.  No injection or focal lesion.  Mouth: lips without lesion/swelling.  Oral mucosa pink and moist.  Dentition intact and without obvious caries or gingival swelling.  Oropharynx without erythema, exudate, or swelling.  Neck: supple/nontender.  No LAD, mass, or TM.  Carotid pulses 2+ bilaterally, without bruits. CV: RRR, no m/r/g.   LUNGS: CTA bilat, nonlabored resps, good aeration in all  lung fields. ABD: soft, NT, ND, BS normal. He has a moderate sized periumbilical abd wall hernia that is nontender and reducible.  No hepatospenomegaly or mass.  No bruits. EXT: no clubbing, cyanosis, or edema.  Musculoskeletal: no joint swelling, erythema, warmth, or tenderness.  ROM of all joints intact. Skin - no sores or suspicious lesions or rashes or color changes   Pertinent labs:  Lab Results  Component Value Date   TSH 1.40 03/15/2018   Lab Results  Component Value Date   WBC 17.9 (H) 04/23/2020   HGB 15.4 04/23/2020   HCT 45.4 04/23/2020   MCV 93.2 04/23/2020   PLT 220 04/23/2020   Lab Results  Component Value Date   CREATININE 1.40 (H) 04/23/2020   BUN 21 04/23/2020   NA 140 04/23/2020   K 4.4 04/23/2020   CL 103 04/23/2020   CO2 26 04/23/2020   Lab Results  Component Value Date   ALT 30 05/09/2019   AST 23 05/09/2019   ALKPHOS 40 05/09/2019   BILITOT 0.5 05/09/2019   Lab Results  Component Value Date   CHOL 151 05/09/2019   Lab Results  Component Value Date   HDL 46.70 05/09/2019   Lab Results  Component Value Date   LDLCALC 85 05/09/2019   Lab Results  Component Value Date   TRIG 101.0 05/09/2019   Lab Results  Component Value Date   CHOLHDL 3 05/09/2019   Lab Results  Component Value Date   PSA 0.51 05/09/2019   PSA 0.73 03/15/2018   PSA 0.72 02/10/2017   Lab Results  Component Value Date   HGBA1C 5.9 (H) 04/18/2020   ASSESSMENT AND PLAN:   1) HTN: poor control. Changed from amlodipine 5 qd to lotrel 10-20 qd. Lytes/cr ordered. Encouraged him to cut back/quit alcohol.  2) HLD: tolerating 1/2 of 20mg  atorva qd.  He last ate 4 hours ago (large BF). FLP and hepatic panel ordered.  3) Prediabetes: diet and activity level not very good, very stressed. Hba1c and non-fasting glucose today.  4) Health maintenance exam: Reviewed age and gender appropriate health maintenance issues (prudent diet, regular exercise, health risks of  tobacco and excessive  alcohol, use of seatbelts, fire alarms in home, use of sunscreen).  Also reviewed age and gender appropriate health screening as well as vaccine recommendations. Vaccines: Covid->UTD.  Flu->given today.  Otherwise all UTD. Labs: cbc, cmet, flp, Hba1c (prediabetes), PSA. Prostate ca screening: PSA ordered. Colon ca screening: next colonoscopy due 2023-2028 range ([name]).  An After Visit Summary was printed and given to the patient.  FOLLOW UP:  Return in about 3 weeks (around 12/08/2020) for f/u HTN.  Need to discuss possibly getting him on SSRI in near future.  Signed:  Crissie Sickles, MD           11/17/2020

## 2020-11-17 NOTE — Patient Instructions (Signed)

## 2020-11-23 ENCOUNTER — Other Ambulatory Visit: Payer: Self-pay | Admitting: Family Medicine

## 2020-12-12 ENCOUNTER — Encounter: Payer: Self-pay | Admitting: Family Medicine

## 2020-12-12 ENCOUNTER — Other Ambulatory Visit: Payer: Self-pay

## 2020-12-12 ENCOUNTER — Ambulatory Visit (INDEPENDENT_AMBULATORY_CARE_PROVIDER_SITE_OTHER): Admitting: Family Medicine

## 2020-12-12 VITALS — BP 130/70 | HR 79 | Temp 98.0°F | Resp 16 | Ht 65.0 in | Wt 253.0 lb

## 2020-12-12 DIAGNOSIS — R300 Dysuria: Secondary | ICD-10-CM | POA: Diagnosis not present

## 2020-12-12 DIAGNOSIS — I10 Essential (primary) hypertension: Secondary | ICD-10-CM

## 2020-12-12 LAB — POCT URINALYSIS DIPSTICK
Bilirubin, UA: NEGATIVE
Blood, UA: NEGATIVE
Glucose, UA: NEGATIVE
Ketones, UA: NEGATIVE
Leukocytes, UA: NEGATIVE
Nitrite, UA: NEGATIVE
Protein, UA: NEGATIVE
Spec Grav, UA: 1.01 (ref 1.010–1.025)
Urobilinogen, UA: 0.2 E.U./dL
pH, UA: 6 (ref 5.0–8.0)

## 2020-12-12 LAB — BASIC METABOLIC PANEL
BUN: 17 mg/dL (ref 6–23)
CO2: 26 mEq/L (ref 19–32)
Calcium: 9.9 mg/dL (ref 8.4–10.5)
Chloride: 103 mEq/L (ref 96–112)
Creatinine, Ser: 1.16 mg/dL (ref 0.40–1.50)
GFR: 66.99 mL/min (ref >60.00)
Glucose, Bld: 109 mg/dL — ABNORMAL HIGH (ref 70–99)
Potassium: 4.1 mEq/L (ref 3.5–5.1)
Sodium: 141 mEq/L (ref 135–145)

## 2020-12-12 MED ORDER — AMLODIPINE BESY-BENAZEPRIL HCL 10-20 MG PO CAPS: 1.0000 | ORAL_CAPSULE | Freq: Every day | ORAL | 3 refills | Status: DC

## 2020-12-12 MED ORDER — SULFAMETHOXAZOLE-TRIMETHOPRIM 800-160 MG PO TABS: 1.0000 | ORAL_TABLET | Freq: Two times a day (BID) | ORAL | 0 refills | Status: DC

## 2020-12-12 NOTE — Progress Notes (Signed)
OFFICE VISIT  12/12/2020  CC:  Chief Complaint  Patient presents with  . Follow-up    Hypertension, pt is not fasting    HPI:    Patient is a 65 y.o. Caucasian male who presents for 1 mo f/u HTN. A/P as of last visit: "1) HTN: poor control. Changed from amlodipine 5 qd to lotrel 10-20 qd. Lytes/cr ordered. Encouraged him to cut back/quit alcohol.  2) HLD: tolerating 1/2 of 20mg  atorva qd.  He last ate 4 hours ago (large BF). FLP and hepatic panel ordered.  3) Prediabetes: diet and activity level not very good, very stressed. Hba1c and non-fasting glucose today.  4) Health maintenance exam: Reviewed age and gender appropriate health maintenance issues (prudent diet, regular exercise, health risks of tobacco and excessive alcohol, use of seatbelts, fire alarms in home, use of sunscreen).  Also reviewed age and gender appropriate health screening as well as vaccine recommendations. Vaccines: Covid->UTD.  Flu->given today.  Otherwise all UTD. Labs: cbc, cmet, flp, Hba1c (prediabetes), PSA. Prostate ca screening: PSA ordered. Colon ca screening: next colonoscopy due 2023-2028 range ([name])."  INTERIM HX: All labs last visit were great. His home bp's since starting the lotrel 10-20 qd have been 120-130/70s.  Onset a few weeks ago of burning with urination.  Chronic nocturia x 3.  Denies daytime urgency or frequency. Says not drastic but hurts a little.  No blood in urine.  Due to body habitus he is unable to see his GU region to see if rash is present or not. Urine clear, ? Little odor.  Denies any known hx of prostate infection or bladder infection.  No abd pain, no n/v, no flank pain, no fevers or malaise.  Past Medical History:  Diagnosis Date  . Allergic rhinitis   . Anxiety and depression   . BPH (benign prostatic hyperplasia)    Nocturia is his primary symptom:  Takes Flomax  . Diverticulitis large intestine 2015/16   Recurrent: GI MD [name].  Smoldering  'itis at most recent f/u CT in fall 2016--pt then had lap sig colectomy.  . Eustachian tube dysfunction    with recurrent "sinus issues"--ENT (Dr. [name]) recommended surgery for nasal turbinate reduction and septoplasty--as of 11/03/15)  . GERD (gastroesophageal reflux disease)   . Hepatic steatosis   . History of adenomatous polyp of colon   . Hyperlipidemia   . Hypertension    Started med 03/2018  . IBS (irritable bowel syndrome)   . Inguinal pain, left    Chronic; [name] to do MRI pelvis w/out contrast 08/2017 but insurance denied it.  . Nephrolithiasis 12/23/14   40mm nonobstructive R renal calc on CT abd/pelv done for diverticulitis  . Obesity, Class II, BMI 35-39.9   . OSA (obstructive sleep apnea)   . 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 Prediabetes    HbA1c 6.0% in 2008 and in 2018.  . Skin cancer    Basal cell removed from left hand    Past Surgical History:  Procedure Laterality Date  . COLONOSCOPY  10/17/2002;  07/26/13   Hx of polyps.  Recall 07/2017 per [name]  . COLONOSCOPY  08/24/2017   Diverticulosis from transverse colon to sigmoid.  Otherwise normal.  Repeat in 5-10 years for screening/[name]  . ESOPHAGOGASTRODUODENOSCOPY     need specifics from pt  . HERNIA REPAIR     multiple.  Right inguinal repair by Dr. [name] in 2004.  07/2017 having some  muscular/nerve pain in groin--evaluated by Dr. Alice Reichert w/u at that time.  Fatima Blank HERNIA REPAIR N/A 04/22/2020   Procedure: HERNIA REPAIR INCISIONAL WITH MESH;  Surgeon: [name], MD;  Location: [place];  Service: General;  Laterality: N/A;  . LAPAROSCOPIC SIGMOID COLECTOMY N/A 09/15/2015   Procedure: LAPAROSCOPIC ASSISTED  SIGMOID COLECTOMY;  Surgeon: [name], MD;  Location: [place] [facility];  Service: General;  Laterality: N/A;  . TONSILLECTOMY      Outpatient Medications Prior to Visit  Medication Sig Dispense Refill  . acetaminophen (TYLENOL) 325 MG tablet Take 650 mg  by mouth every 6 (six) hours as needed (for pain.).    [name] atorvastatin (LIPITOR) 20 MG tablet TAKE 1/2 TABLET(10 MG) BY MOUTH DAILY 15 tablet 0  . Melatonin 5 MG CAPS Take 10 mg by mouth at bedtime.     . Misc Natural Products (PROSTATE) CAPS Take 2 capsules by mouth at bedtime.    . Multiple Vitamins-Minerals (MULTIVITAMIN WITH MINERALS) tablet Take 1 tablet by mouth daily.    . Omega-3 Fatty Acids (FISH OIL) 1200 MG CAPS Take 2,400 mg by mouth in the morning and at bedtime.    . pantoprazole (PROTONIX) 40 MG tablet TAKE 1 TABLET BY MOUTH EVERY DAY BEFORE BREAKFAST 7 tablet 0  . amLODipine-benazepril (LOTREL) 10-20 MG capsule Take 1 capsule by mouth daily. 30 capsule 1  . methocarbamol (ROBAXIN) 500 MG tablet Take 1 tablet (500 mg total) by mouth every 8 (eight) hours as needed for muscle spasms. (Patient not taking: Reported on 12/12/2020) 30 tablet 0   No facility-administered medications prior to visit.    No Known Allergies  ROS As per HPI  PE: Vitals with BMI 12/12/2020 11/17/2020 04/23/2020  Height 5\' 5"  5\' 5"  -  Weight 253 lbs 253 lbs 6 oz -  BMI 67.3 41.93 -  Systolic 790 240 973  Diastolic 70 86 68  Pulse 79 74 77    Gen: Alert, well appearing.  Patient is oriented to person, place, time, and situation. AFFECT: pleasant, lucid thought and speech. CV: RRR, no m/r/g.   LUNGS: CTA bilat, nonlabored resps, good aeration in all lung fields. EXT: no clubbing or cyanosis.  No pitting edema.  GU: penis circumcised, no rash or irritation of penis, glans and meatus are normal-appearing. Rectal exam: negative without mass, lesions or tenderness, PROSTATE EXAM: smooth and symmetric without nodules or tenderness.  LABS:  Lab Results  Component Value Date   TSH 1.40 03/15/2018   Lab Results  Component Value Date   WBC 7.3 11/17/2020   HGB 15.5 11/17/2020   HCT 45.4 11/17/2020   MCV 91.1 11/17/2020   PLT 194.0 11/17/2020   Lab Results  Component Value Date   CREATININE 1.05  11/17/2020   BUN 17 11/17/2020   NA 141 11/17/2020   K 4.2 11/17/2020   CL 105 11/17/2020   CO2 26 11/17/2020   Lab Results  Component Value Date   ALT 34 11/17/2020   AST 25 11/17/2020   ALKPHOS 62 11/17/2020   BILITOT 0.5 11/17/2020   Lab Results  Component Value Date   CHOL 161 11/17/2020   Lab Results  Component Value Date   HDL 58.40 11/17/2020   Lab Results  Component Value Date   LDLCALC 66 11/17/2020   Lab Results  Component Value Date   TRIG 185.0 (H) 11/17/2020   Lab Results  Component Value Date   CHOLHDL 3 11/17/2020   Lab Results  Component Value Date  PSA 0.65 11/17/2020   PSA 0.51 05/09/2019   PSA 0.73 03/15/2018   Lab Results  Component Value Date   HGBA1C 5.9 11/17/2020   POC CC dipstick UA today is NORMAL  IMPRESSION AND PLAN:  1) HTN: now well controlled. Cont lotrel 10-20 qd. BMET today.  2) Dysuria: has some BPH that manifests as nocturia only.  UA normal. Question prostatitis as cause of latest symptom of dysuria: will do trial of bactrim ds 1 bid x 2 wks.  Sent urine for c/s for completeness.  An After Visit Summary was printed and given to the patient.  FOLLOW UP: Return in about 6 months (around 06/14/2021) for routine chronic illness f/u.  Signed:  Crissie Sickles, MD           12/12/2020

## 2020-12-14 LAB — URINE CULTURE
MICRO NUMBER:: 11640768
Result:: NO GROWTH
SPECIMEN QUALITY:: ADEQUATE

## 2020-12-16 ENCOUNTER — Other Ambulatory Visit: Payer: Self-pay

## 2020-12-16 MED ORDER — PANTOPRAZOLE SODIUM 40 MG PO TBEC: DELAYED_RELEASE_TABLET | ORAL | 1 refills | Status: DC

## 2020-12-23 ENCOUNTER — Other Ambulatory Visit: Payer: Self-pay

## 2020-12-23 MED ORDER — ATORVASTATIN CALCIUM 20 MG PO TABS: ORAL_TABLET | ORAL | 1 refills | Status: DC

## 2021-03-04 ENCOUNTER — Encounter: Payer: Self-pay | Admitting: Family Medicine

## 2021-03-04 ENCOUNTER — Ambulatory Visit (INDEPENDENT_AMBULATORY_CARE_PROVIDER_SITE_OTHER): Admitting: Family Medicine

## 2021-03-04 ENCOUNTER — Other Ambulatory Visit: Payer: Self-pay

## 2021-03-04 ENCOUNTER — Telehealth: Payer: Self-pay

## 2021-03-04 VITALS — BP 114/70 | HR 89 | Temp 98.4°F | Resp 16 | Ht 65.0 in | Wt 253.8 lb

## 2021-03-04 DIAGNOSIS — Z9049 Acquired absence of other specified parts of digestive tract: Secondary | ICD-10-CM | POA: Diagnosis not present

## 2021-03-04 DIAGNOSIS — K5792 Diverticulitis of intestine, part unspecified, without perforation or abscess without bleeding: Secondary | ICD-10-CM

## 2021-03-04 MED ORDER — METRONIDAZOLE 500 MG PO TABS
500.0000 mg | ORAL_TABLET | Freq: Three times a day (TID) | ORAL | 0 refills | Status: DC
Start: 2021-03-04 — End: 2021-03-18

## 2021-03-04 MED ORDER — CIPROFLOXACIN HCL 500 MG PO TABS
500.0000 mg | ORAL_TABLET | Freq: Two times a day (BID) | ORAL | 0 refills | Status: DC
Start: 2021-03-04 — End: 2021-03-18

## 2021-03-04 NOTE — Progress Notes (Signed)
OFFICE VISIT  03/04/2021  CC:  Chief Complaint  Patient presents with  . Abdominal Pain    3-4 d, left groin radiating to lower back. Has taken tylenol as well   HPI:    Patient is a 65 y.o. Caucasian male with history of recurrent diverticulitis who presents for abdominal pain. Onset a few days ago, L side/abd pain, L groin pain, feels crummy, legs feel heavy. No dysuria or urgency or frequency.  No n/v/d or constipation.  No melena or hematochezia. Ice pack helps his L groin pain some.  He notes no bulging in groin. He has had some bloating. Says all these sx's were present when he got dx'd with severe case of diverticulitis a few years ago (CT imaging confirmed). He's scheduled for umbilical hernia repair in a few weeks.  ROS as above, plus--> no fevers, no CP, no SOB, no wheezing, no cough, no dizziness, no HAs, no rashes,No polyuria or polydipsia.  No myalgias or arthralgias.  No focal weakness, paresthesias, or tremors.  No acute vision or hearing abnormalities.  No recent changes in lower legs. No palpitations.     Past Medical History:  Diagnosis Date  . Allergic rhinitis   . Anxiety and depression   . BPH (benign prostatic hyperplasia)    Nocturia is his primary symptom:  Takes Flomax  . Diverticulitis large intestine 2015/16   Recurrent: GI MD [name].  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  . Eustachian tube dysfunction    with recurrent "sinus issues"--ENT (Dr. [name]) recommended surgery for nasal turbinate reduction and septoplasty--as of 11/03/15)  . GERD (gastroesophageal reflux disease)   . Hepatic steatosis   . History of adenomatous polyp of colon   . Hyperlipidemia   . Hypertension    Started med 03/2018  . IBS (irritable bowel syndrome)   . Inguinal pain, left    Chronic; [name] to do MRI pelvis w/out contrast 08/2017 but insurance denied it.  . Nephrolithiasis 12/23/14   70mm nonobstructive R renal calc  on CT abd/pelv done for diverticulitis  . Obesity, Class II, BMI 35-39.9   . OSA (obstructive sleep apnea)   . 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 Prediabetes    HbA1c 6.0% in 2008 and in 2018.  . Skin cancer    Basal cell removed from left hand    Past Surgical History:  Procedure Laterality Date  . COLONOSCOPY  10/17/2002;  07/26/13   Hx of polyps.  Recall 07/2017 per [name]  . COLONOSCOPY  08/24/2017   Diverticulosis from transverse colon to sigmoid.  Otherwise normal.  Repeat in 5-10 years for screening/[name]  . ESOPHAGOGASTRODUODENOSCOPY     need specifics from pt  . HERNIA REPAIR     multiple.  Right inguinal repair by Dr. [name] in 2004.  07/2017 having some muscular/nerve pain in groin--evaluated by Dr. Alice Reichert w/u at that time.  Fatima Blank HERNIA REPAIR N/A 04/22/2020   Procedure: HERNIA REPAIR INCISIONAL WITH MESH;  Surgeon: [name], MD;  Location: [place];  Service: General;  Laterality: N/A;  . LAPAROSCOPIC SIGMOID COLECTOMY N/A 09/15/2015   Procedure: LAPAROSCOPIC ASSISTED  SIGMOID COLECTOMY;  Surgeon: [name], MD;  Location: [place];  Service: General;  Laterality: N/A;  . TONSILLECTOMY      Outpatient Medications Prior to Visit  Medication Sig Dispense Refill  . acetaminophen (TYLENOL) 325 MG tablet Take 650 mg  by mouth every 6 (six) hours as needed (for pain.).    [name] amLODipine-benazepril (LOTREL) 10-20 MG capsule Take 1 capsule by mouth daily. 90 capsule 3  . atorvastatin (LIPITOR) 20 MG tablet TAKE 1/2 TABLET(10 MG) BY MOUTH DAILY. 45 tablet 1  . Melatonin 5 MG CAPS Take 10 mg by mouth at bedtime.     . Misc Natural Products (PROSTATE) CAPS Take 2 capsules by mouth at bedtime.    . Multiple Vitamins-Minerals (MULTIVITAMIN WITH MINERALS) tablet Take 1 tablet by mouth daily.    . Omega-3 Fatty Acids (FISH OIL) 1200 MG CAPS Take 2,400 mg by mouth in the morning and at bedtime.    . pantoprazole (PROTONIX) 40 MG  tablet TAKE 1 TABLET BY MOUTH EVERY DAY BEFORE BREAKFAST 90 tablet 1  . sulfamethoxazole-trimethoprim (BACTRIM DS) 800-160 MG tablet Take 1 tablet by mouth 2 (two) times daily. (Patient not taking: Reported on 03/04/2021) 28 tablet 0   No facility-administered medications prior to visit.    No Known Allergies  ROS As per HPI  PE: Vitals with BMI 03/04/2021 12/12/2020 11/17/2020  Height 5\' 5"  5\' 5"  5\' 5"   Weight 253 lbs 13 oz 253 lbs 253 lbs 6 oz  BMI 42.23 62.9 47.65  Systolic 465 035 465  Diastolic 70 70 86  Pulse 89 79 74     Gen: Alert, well appearing.  Patient is oriented to person, place, time, and situation. AFFECT: pleasant, lucid thought and speech. CV: RRR, no m/r/g.   LUNGS: CTA bilat, nonlabored resps, good aeration in all lung fields. ABD: soft, ND, BS normal.  Moderate TTP in LLQ and L groin.  No rebound tenderness or guarding. No groin mass or hernia.  No hepatospenomegaly or mass.  No bruits. EXT: no clubbing or cyanosis.  no edema.  Skin: no rash.  LABS:    Chemistry      Component Value Date/Time   NA 141 12/12/2020 1139   K 4.1 12/12/2020 1139   CL 103 12/12/2020 1139   CO2 26 12/12/2020 1139   BUN 17 12/12/2020 1139   CREATININE 1.16 12/12/2020 1139   CREATININE 0.96 01/29/2016 0840      Component Value Date/Time   CALCIUM 9.9 12/12/2020 1139   ALKPHOS 62 11/17/2020 1154   AST 25 11/17/2020 1154   ALT 34 11/17/2020 1154   BILITOT 0.5 11/17/2020 1154     Lab Results  Component Value Date   WBC 7.3 11/17/2020   HGB 15.5 11/17/2020   HCT 45.4 11/17/2020   MCV 91.1 11/17/2020   PLT 194.0 11/17/2020   Lab Results  Component Value Date   HGBA1C 5.9 11/17/2020   IMPRESSION AND PLAN:  1) Acute diverticulitis highly suspected. Pt with hx of recurrent diverticulitis requiring sigmoid colectomy back in 2016. Cipro 500 bid x 14d and flagyl 500 tid x 14d rx'd today. Signs/symptoms to call or return for were reviewed and pt expressed  understanding.  An After Visit Summary was printed and given to the patient.  FOLLOW UP: Return if symptoms worsen or fail to improve.  Signed:  Crissie Sickles, MD           03/04/2021

## 2021-03-04 NOTE — Telephone Encounter (Signed)
Yes, in person preferred

## 2021-03-04 NOTE — Telephone Encounter (Addendum)
Is 4 pm tomorrow VV or in person okay?

## 2021-03-04 NOTE — Telephone Encounter (Signed)
Schedule change. Pt available and scheduled for today at 2:30. Ok'd by PCP

## 2021-03-04 NOTE — Telephone Encounter (Signed)
Needs appointment

## 2021-03-04 NOTE — Telephone Encounter (Signed)
Pt called stating that he thinks he is having a flare up from his diverticulosis and wanted to know if he could have Rx sent or if he would need to have an appt. Please advise

## 2021-03-05 ENCOUNTER — Ambulatory Visit: Admitting: Family Medicine

## 2021-03-18 ENCOUNTER — Other Ambulatory Visit: Payer: Self-pay

## 2021-03-18 ENCOUNTER — Encounter: Payer: Self-pay | Admitting: Family Medicine

## 2021-03-18 ENCOUNTER — Ambulatory Visit: Admitting: Family Medicine

## 2021-03-18 VITALS — BP 110/70 | HR 76 | Temp 98.0°F | Resp 16 | Ht 65.0 in | Wt 248.6 lb

## 2021-03-18 DIAGNOSIS — R1012 Left upper quadrant pain: Secondary | ICD-10-CM | POA: Diagnosis not present

## 2021-03-18 DIAGNOSIS — K5792 Diverticulitis of intestine, part unspecified, without perforation or abscess without bleeding: Secondary | ICD-10-CM | POA: Diagnosis not present

## 2021-03-18 DIAGNOSIS — F1010 Alcohol abuse, uncomplicated: Secondary | ICD-10-CM

## 2021-03-18 DIAGNOSIS — R198 Other specified symptoms and signs involving the digestive system and abdomen: Secondary | ICD-10-CM

## 2021-03-18 DIAGNOSIS — R634 Abnormal weight loss: Secondary | ICD-10-CM

## 2021-03-18 NOTE — Progress Notes (Signed)
OFFICE VISIT  03/18/2021  CC:  Chief Complaint  Patient presents with   Follow-up    Diverticulits    HPI:    Patient is a 65 y.o. Caucasian male who presents for 2 wk f/u LLQ abd and L groin pain suspected to be a recurrence of diverticulitis. A/P as of last visit: "1) Acute diverticulitis highly suspected. Pt with hx of recurrent diverticulitis requiring sigmoid colectomy back in 2016. Cipro 500 bid x 14d and flagyl 500 tid x 14d rx'd today. Signs/symptoms to call or return for were reviewed and pt expressed understanding."  INTERIM HX: Feels just a smidge of intermittent LLQ and L groin pain but says "something still doesn't feel right".  He seems to have on/off pain in L mid/upper abd. Says he is worried b/c he has random episodes of "gagging"->says he gets hot, starts to cough and gets a gag.  No n/v. No dysphagia or odynophagia.  Denies GER sx's. Mild discomfort in throat and chronic nasal allergies and PND but this is nothing new for him.   Appetite is good, PO intake unchanged.  Wt is down 5 lbs in the last 2 wks.  Not drinking much last 2 weeks while on abx. Prior to that he was drinking 6 oz or more of liquor daily chronically.    He reiterates the amount of emotional/psych stress he is under with his chronically ill wife and his upcoming hernia surgery.  ROS as above, plus--> no fevers, no CP, no SOB, no wheezing, no dizziness, no HAs, no rashes, no melena/hematochezia.  No polyuria or polydipsia.  No myalgias or arthralgias.  No focal weakness, paresthesias, or tremors.  No acute vision or hearing abnormalities.  No dysuria or unusual/new urinary urgency or frequency.  No recent changes in lower legs. No diarrhea or constipation. No palpitations.      Past Medical History:  Diagnosis Date   Allergic rhinitis    Anxiety and depression    BPH (benign prostatic hyperplasia)    Nocturia is his primary symptom:  Takes Flomax   Diverticulitis large intestine 2015/16    Recurrent: GI MD [name].  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   Eustachian tube dysfunction    with recurrent "sinus issues"--ENT (Dr. [name]) recommended surgery for nasal turbinate reduction and septoplasty--as of 11/03/15)   GERD (gastroesophageal reflux disease)    Hepatic steatosis    History of adenomatous polyp of colon    Hyperlipidemia    Hypertension    Started med 03/2018   IBS (irritable bowel syndrome)    Inguinal pain, left    Chronic; [name] to do MRI pelvis w/out contrast 08/2017 but insurance denied it.   Nephrolithiasis 12/23/14   57mm nonobstructive R renal calc on CT abd/pelv done for diverticulitis   Obesity, Class II, BMI 35-39.9    OSA (obstructive sleep apnea)    Osteoarthritis of hips, bilateral 03/2018   moderate   Prediabetes    HbA1c 6.0% in 2008 and in 2018.   Skin cancer    Basal cell removed from left hand    Past Surgical History:  Procedure Laterality Date   COLONOSCOPY  10/17/2002;  07/26/13   Hx of polyps.  Recall 07/2017 per [name]   COLONOSCOPY  08/24/2017   Diverticulosis from transverse colon to sigmoid.  Otherwise normal.  Repeat in 5-10 years for screening/[name]   ESOPHAGOGASTRODUODENOSCOPY     need specifics from  pt   HERNIA REPAIR     multiple.  Right inguinal repair by Dr. [name] in 2004.  07/2017 having some muscular/nerve pain in groin--evaluated by Dr. Alice Reichert w/u at that time.   INCISIONAL HERNIA REPAIR N/A 04/22/2020   Procedure: HERNIA REPAIR INCISIONAL WITH MESH;  Surgeon: [name], MD;  Location: [place];  Service: General;  Laterality: N/A;   LAPAROSCOPIC SIGMOID COLECTOMY N/A 09/15/2015   Procedure: LAPAROSCOPIC ASSISTED  SIGMOID COLECTOMY;  Surgeon: [name], MD;  Location: [place];  Service: General;  Laterality: N/A;   TONSILLECTOMY      Outpatient Medications Prior to Visit  Medication Sig Dispense Refill   acetaminophen (TYLENOL) 325 MG  tablet Take 650 mg by mouth every 6 (six) hours as needed for moderate pain.     amLODipine-benazepril (LOTREL) 10-20 MG capsule Take 1 capsule by mouth daily. 90 capsule 3   atorvastatin (LIPITOR) 20 MG tablet TAKE 1/2 TABLET(10 MG) BY MOUTH DAILY. (Patient taking differently: Take 20 mg by mouth daily.) 45 tablet 1   cetirizine (ZYRTEC) 10 MG tablet Take 10 mg by mouth daily.     Melatonin 10 MG TABS Take 10 mg by mouth at bedtime.      Misc Natural Products (PROSTATE) CAPS Take 2 capsules by mouth at bedtime.     Multiple Vitamins-Minerals (MULTIVITAMIN WITH MINERALS) tablet Take 1 tablet by mouth daily.     Omega-3 Fatty Acids (FISH OIL) 1200 MG CAPS Take 1,200 mg by mouth in the morning.     OVER THE COUNTER MEDICATION Take 1 Scoop by mouth daily. Super Beets Complex     OVER THE COUNTER MEDICATION Take 2 capsules by mouth at bedtime. Omega 3, Resveratrol and Turmeric     pantoprazole (PROTONIX) 40 MG tablet TAKE 1 TABLET BY MOUTH EVERY DAY BEFORE BREAKFAST (Patient taking differently: Take 40 mg by mouth daily before breakfast.) 90 tablet 1   ciprofloxacin (CIPRO) 500 MG tablet Take 1 tablet (500 mg total) by mouth 2 (two) times daily for 14 days. (Patient not taking: No sig reported) 28 tablet 0   metroNIDAZOLE (FLAGYL) 500 MG tablet Take 1 tablet (500 mg total) by mouth 3 (three) times daily for 14 days. (Patient not taking: No sig reported) 42 tablet 0   No facility-administered medications prior to visit.    No Known Allergies  ROS As per HPI  PE: Vitals with BMI 03/18/2021 03/04/2021 12/12/2020  Height $Remov'5\' 5"'XMQitL$  $Remove'5\' 5"'PRmXxnS$  $RemoveB'5\' 5"'PgBCdDat$   Weight 248 lbs 10 oz 253 lbs 13 oz 253 lbs  BMI 41.37 46.80 32.1  Systolic 224 825 003  Diastolic 70 70 70  Pulse 76 89 79   Gen: Alert, well appearing.  Patient is oriented to person, place, time, and situation. AFFECT: pleasant, lucid thought and speech. BCW:UGQB: no injection, icteris, swelling, or exudate.  EOMI, PERRLA. Mouth: lips without  lesion/swelling.  Oral mucosa pink and moist. Oropharynx without erythema, exudate, or swelling.  CV: RRR, no m/r/g.   LUNGS: CTA bilat, nonlabored resps, good aeration in all lung fields. ABD: soft, NT, rotund but ND, BS normal.  No hepatospenomegaly or mass.  No bruits. EXT: no clubbing or cyanosis.  no edema.  SKIN: no jaundice or pallor.  LABS:    Chemistry      Component Value Date/Time   NA 141 12/12/2020 1139   K 4.1 12/12/2020 1139   CL 103 12/12/2020 1139   CO2 26 12/12/2020 1139   BUN 17 12/12/2020 1139  CREATININE 1.16 12/12/2020 1139   CREATININE 0.96 01/29/2016 0840      Component Value Date/Time   CALCIUM 9.9 12/12/2020 1139   ALKPHOS 62 11/17/2020 1154   AST 25 11/17/2020 1154   ALT 34 11/17/2020 1154   BILITOT 0.5 11/17/2020 1154     Lab Results  Component Value Date   WBC 7.3 11/17/2020   HGB 15.5 11/17/2020   HCT 45.4 11/17/2020   MCV 91.1 11/17/2020   PLT 194.0 11/17/2020   Lab Results  Component Value Date   HGBA1C 5.9 11/17/2020     IMPRESSION AND PLAN:  1) Acute diverticulitis: resolved with abx.  2) Intermittent L UQ abd pain.  Gagging->unclear etiology. Abd exam essentially normal today. Will check lipase, cmet, cbc w/diff, and esr. As long as these are all normal we'll hold off on any abd imaging. See back in office in 1 mo to re-eval, earlier if things are worsening.   An After Visit Summary was printed and given to the patient.  FOLLOW UP: Return in about 4 weeks (around 04/15/2021) for f/u abd pain.  Signed:  Crissie Sickles, MD           03/18/2021

## 2021-03-19 LAB — CBC WITH DIFFERENTIAL/PLATELET
Basophils Absolute: 0 10*3/uL (ref 0.0–0.1)
Basophils Relative: 0.5 % (ref 0.0–3.0)
Eosinophils Absolute: 0 10*3/uL (ref 0.0–0.7)
Eosinophils Relative: 0.3 % (ref 0.0–5.0)
HCT: 45.7 % (ref 39.0–52.0)
Hemoglobin: 15.4 g/dL (ref 13.0–17.0)
Lymphocytes Relative: 26.1 % (ref 12.0–46.0)
Lymphs Abs: 2.2 10*3/uL (ref 0.7–4.0)
MCHC: 33.7 g/dL (ref 30.0–36.0)
MCV: 93.5 fl (ref 78.0–100.0)
Monocytes Absolute: 0.8 10*3/uL (ref 0.1–1.0)
Monocytes Relative: 9.8 % (ref 3.0–12.0)
Neutro Abs: 5.4 10*3/uL (ref 1.4–7.7)
Neutrophils Relative %: 63.3 % (ref 43.0–77.0)
Platelets: 229 10*3/uL (ref 150.0–400.0)
RBC: 4.88 Mil/uL (ref 4.22–5.81)
RDW: 13.7 % (ref 11.5–15.5)
WBC: 8.5 10*3/uL (ref 4.0–10.5)

## 2021-03-19 LAB — COMPREHENSIVE METABOLIC PANEL
ALT: 59 U/L — ABNORMAL HIGH (ref 0–53)
AST: 48 U/L — ABNORMAL HIGH (ref 0–37)
Albumin: 4.6 g/dL (ref 3.5–5.2)
Alkaline Phosphatase: 42 U/L (ref 39–117)
BUN: 17 mg/dL (ref 6–23)
CO2: 26 mEq/L (ref 19–32)
Calcium: 9.6 mg/dL (ref 8.4–10.5)
Chloride: 104 mEq/L (ref 96–112)
Creatinine, Ser: 1.28 mg/dL (ref 0.40–1.50)
GFR: 59.42 mL/min — ABNORMAL LOW (ref >60.00)
Glucose, Bld: 107 mg/dL — ABNORMAL HIGH (ref 70–99)
Potassium: 4.5 mEq/L (ref 3.5–5.1)
Sodium: 141 mEq/L (ref 135–145)
Total Bilirubin: 0.6 mg/dL (ref 0.2–1.2)
Total Protein: 7 g/dL (ref 6.0–8.3)

## 2021-03-19 LAB — SEDIMENTATION RATE: Sed Rate: 26 mm/hr — ABNORMAL HIGH (ref 0–20)

## 2021-03-19 LAB — LIPASE: Lipase: 21 U/L (ref 11.0–59.0)

## 2021-03-20 ENCOUNTER — Inpatient Hospital Stay (HOSPITAL_COMMUNITY): Admission: RE | Admit: 2021-03-20 | Source: Ambulatory Visit

## 2021-03-20 ENCOUNTER — Other Ambulatory Visit

## 2021-03-20 ENCOUNTER — Other Ambulatory Visit: Payer: Self-pay

## 2021-03-20 DIAGNOSIS — R7401 Elevation of levels of liver transaminase levels: Secondary | ICD-10-CM

## 2021-03-23 LAB — HEPATITIS C ANTIBODY
Hepatitis C Ab: NONREACTIVE
SIGNAL TO CUT-OFF: 0.01 (ref <1.00)

## 2021-03-23 LAB — HEPATITIS B SURFACE ANTIGEN: Hepatitis B Surface Ag: NONREACTIVE

## 2021-03-23 LAB — HEPATITIS B CORE ANTIBODY, IGM: Hep B C IgM: NONREACTIVE

## 2021-03-24 ENCOUNTER — Ambulatory Visit: Admit: 2021-03-24 | Admitting: General Surgery

## 2021-03-24 SURGERY — SUPRA-UMBILICAL HERNIA
Anesthesia: General

## 2021-04-17 ENCOUNTER — Ambulatory Visit: Admitting: Family Medicine

## 2021-04-17 ENCOUNTER — Encounter: Payer: Self-pay | Admitting: Family Medicine

## 2021-04-17 ENCOUNTER — Other Ambulatory Visit: Payer: Self-pay

## 2021-04-17 VITALS — BP 128/81 | HR 73 | Temp 97.9°F | Resp 16 | Ht 65.0 in | Wt 239.8 lb

## 2021-04-17 DIAGNOSIS — N401 Enlarged prostate with lower urinary tract symptoms: Secondary | ICD-10-CM

## 2021-04-17 DIAGNOSIS — R3915 Urgency of urination: Secondary | ICD-10-CM | POA: Diagnosis not present

## 2021-04-17 DIAGNOSIS — R350 Frequency of micturition: Secondary | ICD-10-CM

## 2021-04-17 DIAGNOSIS — R944 Abnormal results of kidney function studies: Secondary | ICD-10-CM

## 2021-04-17 DIAGNOSIS — R351 Nocturia: Secondary | ICD-10-CM | POA: Diagnosis not present

## 2021-04-17 DIAGNOSIS — N138 Other obstructive and reflux uropathy: Secondary | ICD-10-CM

## 2021-04-17 LAB — POCT URINALYSIS DIPSTICK
Bilirubin, UA: NEGATIVE
Blood, UA: NEGATIVE
Glucose, UA: NEGATIVE
Ketones, UA: NEGATIVE
Leukocytes, UA: NEGATIVE
Nitrite, UA: NEGATIVE
Protein, UA: NEGATIVE
Spec Grav, UA: 1.025 (ref 1.010–1.025)
Urobilinogen, UA: 0.2 E.U./dL
pH, UA: 6.5 (ref 5.0–8.0)

## 2021-04-17 NOTE — Progress Notes (Signed)
OFFICE VISIT  04/17/2021  CC:  Chief Complaint  Patient presents with   Follow-up    Abdominal pain; still having some pain and discomfort along with LBP. Denies any burning with urination but having frequency.   HPI:    Patient is a 65 y.o. Caucasian male who presents for 1 mo f/u recurrent abd pain. A/P as of last visit: "1) Acute diverticulitis: resolved with abx.   2) Intermittent L UQ abd pain.  Gagging->unclear etiology. Abd exam essentially normal today. Will check lipase, cmet, cbc w/diff, and esr. As long as these are all normal we'll hold off on any abd imaging. See back in office in 1 mo to re-eval, earlier if things are worsening"  INTERIM HX: He cancelled his umbil hernia repair b/c high anxiety. He's anxious about everything, asks about his kidney function tests done recently. On a good note he has completely abstained from all types of alcohol in the last 3-4 wks.  Has chronic nocturia x 3, urinary frequency and urgency in daytime.  Stream is strong and says he empties well.  Has been on flomax but recently stopped this med to see if he felt any diff and says no diff.  I had him on both flomax and proscar in the past but this combo caused ED for him.  Labs last visit all normal except AST 48, ALT 59.  Viral Hep serologist neg. He has known hepatic steatosis. His sCr was 1.28 (GFR 60), compared to 1.16 in March this year, and 1.05 in Feb this year. Baseline sCr up until 2021 was about 1.0.  Past Medical History:  Diagnosis Date   Allergic rhinitis    Anxiety and depression    BPH (benign prostatic hyperplasia)    Nocturia is his primary symptom:  Takes Flomax   Diverticulitis large intestine 2015/16   Recurrent: GI MD [name].  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   Eustachian tube dysfunction    with recurrent "sinus issues"--ENT (Dr. [name]) recommended surgery for nasal turbinate reduction and septoplasty--as  of 11/03/15)   GERD (gastroesophageal reflux disease)    Hepatic steatosis    History of adenomatous polyp of colon    Hyperlipidemia    Hypertension    Started med 03/2018   IBS (irritable bowel syndrome)    Inguinal pain, left    Chronic; [name] to do MRI pelvis w/out contrast 08/2017 but insurance denied it.   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   Skin cancer    Basal cell removed from left hand    Past Surgical History:  Procedure Laterality Date   COLONOSCOPY  10/17/2002;  07/26/13   Hx of polyps.  Recall 07/2017 per Dr. MWatt Climes  COLONOSCOPY  08/24/2017   Diverticulosis from transverse colon to sigmoid.  Otherwise normal.  Repeat in 5-10 years for screening/Dr. MWatt Climes  ESOPHAGOGASTRODUODENOSCOPY     need specifics from pt   HERNIA REPAIR     multiple.  Right inguinal repair by Dr. BGeorganna Skeansin 2004.  07/2017 having some muscular/nerve pain in groin--evaluated by Dr. TAlice Reichertw/u at that time.   INCISIONAL HERNIA REPAIR N/A 04/22/2020  Procedure: HERNIA REPAIR INCISIONAL WITH MESH;  Surgeon: [name], MD;  Location: [place];  Service: General;  Laterality: N/A;   LAPAROSCOPIC SIGMOID COLECTOMY N/A 09/15/2015   Procedure: LAPAROSCOPIC ASSISTED  SIGMOID COLECTOMY;  Surgeon: [name], MD;  Location: [place];  Service: General;  Laterality: N/A;   TONSILLECTOMY      Outpatient Medications Prior to Visit  Medication Sig Dispense Refill   acetaminophen (TYLENOL) 325 MG tablet Take 650 mg by mouth every 6 (six) hours as needed for moderate pain.     amLODipine-benazepril (LOTREL) 10-20 MG capsule Take 1 capsule by mouth daily. 90 capsule 3   atorvastatin (LIPITOR) 20 MG tablet TAKE 1/2 TABLET(10 MG) BY MOUTH DAILY. (Patient  taking differently: Take 20 mg by mouth daily.) 45 tablet 1   cetirizine (ZYRTEC) 10 MG tablet Take 10 mg by mouth daily.     Melatonin 10 MG TABS Take 10 mg by mouth at bedtime.      Misc Natural Products (PROSTATE) CAPS Take 2 capsules by mouth at bedtime.     Multiple Vitamins-Minerals (MULTIVITAMIN WITH MINERALS) tablet Take 1 tablet by mouth daily.     Omega-3 Fatty Acids (FISH OIL) 1200 MG CAPS Take 1,200 mg by mouth in the morning.     OVER THE COUNTER MEDICATION Take 1 Scoop by mouth daily. Super Beets Complex     OVER THE COUNTER MEDICATION Take 2 capsules by mouth at bedtime. Omega 3, Resveratrol and Turmeric     pantoprazole (PROTONIX) 40 MG tablet TAKE 1 TABLET BY MOUTH EVERY DAY BEFORE BREAKFAST (Patient not taking: Reported on 04/17/2021) 90 tablet 1   No facility-administered medications prior to visit.    No Known Allergies  ROS As per HPI  PE: Vitals with BMI 04/17/2021 03/18/2021 03/04/2021  Height _0  _1  _2   Weight 239 lbs 13 oz 248 lbs 10 oz 253 lbs 13 oz  BMI 39.9 16.10 96.04  Systolic 540 981 191  Diastolic 81 70 70  Pulse 73 76 89   Gen: Alert, well appearing.  Patient is oriented to person, place, time, and situation. AFFECT: pleasant, lucid thought and speech. No further exam today.  LABS:  Lab Results  Component Value Date   TSH 1.40 03/15/2018   Lab Results  Component Value Date   WBC 8.5 03/18/2021   HGB 15.4 03/18/2021   HCT 45.7 03/18/2021   MCV 93.5 03/18/2021   PLT 229.0 03/18/2021   Lab Results  Component Value Date   CREATININE 1.28 03/18/2021   BUN 17 03/18/2021   NA 141 03/18/2021   K 4.5 03/18/2021   CL 104 03/18/2021   CO2 26 03/18/2021   Lab Results  Component Value Date   ALT 59 (H) 03/18/2021   AST 48 (H) 03/18/2021   ALKPHOS 42 03/18/2021   BILITOT 0.6 03/18/2021   Lab Results  Component Value Date   LIPASE 21.0 03/18/2021   Lab Results  Component Value Date   ESRSEDRATE 26 (H) 03/18/2021   Lab Results   Component Value Date   CHOL 161 11/17/2020   Lab Results  Component Value Date   HDL 58.40 11/17/2020   Lab Results  Component Value Date   LDLCALC 66 11/17/2020   Lab Results  Component Value Date   TRIG 185.0 (H) 11/17/2020   Lab Results  Component Value Date   CHOLHDL 3 11/17/2020   Lab Results  Component Value Date   PSA 0.65 11/17/2020   PSA  0.51 05/09/2019   PSA 0.73 03/15/2018   Lab Results  Component Value Date   HGBA1C 5.9 11/17/2020   CC UA today is NORMAL  IMPRESSION AND PLAN:  1) BPH with LUT obst sx's: not responsive to medical tx-->referred to urology for further eval today.  2) CRI II/III: suspect medical renal dz secondary to chronic HTN, but given the presence of BPH with obstructive sx's and gradual slight increase in sCr the last year or so I will check renal ultrasound.  An After Visit Summary was printed and given to the patient.  FOLLOW UP: Return in about 7 months (around 11/18/2021) for annual CPE (fasting).  Signed:  Crissie Sickles, MD           04/17/2021

## 2021-04-19 LAB — URINE CULTURE
MICRO NUMBER:: 12126669
SPECIMEN QUALITY:: ADEQUATE

## 2021-04-20 ENCOUNTER — Ambulatory Visit (HOSPITAL_BASED_OUTPATIENT_CLINIC_OR_DEPARTMENT_OTHER)
Admission: RE | Admit: 2021-04-20 | Discharge: 2021-04-20 | Disposition: A | Discharge status: Home / Self Care | Source: Ambulatory Visit | Attending: Family Medicine | Admitting: Family Medicine

## 2021-04-20 ENCOUNTER — Other Ambulatory Visit: Payer: Self-pay

## 2021-04-20 DIAGNOSIS — N401 Enlarged prostate with lower urinary tract symptoms: Secondary | ICD-10-CM | POA: Diagnosis present

## 2021-04-20 DIAGNOSIS — N138 Other obstructive and reflux uropathy: Secondary | ICD-10-CM | POA: Insufficient documentation

## 2021-04-21 ENCOUNTER — Encounter: Payer: Self-pay | Admitting: Family Medicine

## 2021-06-16 ENCOUNTER — Other Ambulatory Visit: Payer: Self-pay | Admitting: Family Medicine

## 2021-06-17 ENCOUNTER — Ambulatory Visit: Admitting: Family Medicine

## 2021-06-22 ENCOUNTER — Other Ambulatory Visit: Payer: Self-pay | Admitting: Family Medicine

## 2021-07-07 ENCOUNTER — Ambulatory Visit: Payer: Self-pay | Admitting: General Surgery

## 2021-07-07 NOTE — Progress Notes (Addendum)
Surgical Instructions    Your procedure is scheduled on Tuesday, October 11th.  Report to [facility] Main Entrance "A" at 5:30 A.M., then check in with the Admitting office.  Call this number if you have problems the morning of surgery:  [phone]   If you have any questions prior to your surgery date call [phone]: Open Monday-Friday 8am-4pm    Remember:  Do not eat after midnight the night before your surgery  You may drink clear liquids until 4:30 AM the morning of your surgery.   Clear liquids allowed are: Water, Non-Citrus Juices (without pulp), Carbonated Beverages, Clear Tea, Black Coffee ONLY (NO MILK, CREAM OR POWDERED CREAMER of any kind), and Gatorade  Please complete your G2 Gatorade that was provided to you by 4:30 AM the morning of surgery.  Please, if able, drink it in one setting. DO NOT SIP.     Take these medicines the morning of surgery with A SIP OF WATER Atorvastatin (Lipitor) Zyrtec  If needed: Tylenol  Pantoprazole (Protonix)   As of today, STOP taking any Aspirin (unless otherwise instructed by your surgeon) Aleve, Naproxen, Ibuprofen, Motrin, Advil, Goody's, BC's, all herbal medications, fish oil, and all vitamins.          Do not wear jewelry  Do not wear lotions, powders, perfumes, or deodorant. Men may shave face and neck. Do not bring valuables to the hospital.              [facility] is not responsible for any belongings or valuables.  Do NOT Smoke (Tobacco/Vaping)  24 hours prior to your procedure If you use a CPAP at night, you may bring your mask for your overnight stay.   Contacts, glasses, dentures or bridgework may not be worn into surgery, please bring cases for these belongings   For patients admitted to the hospital, discharge time will be determined by your treatment team.   Patients discharged the day of surgery will not be allowed to drive home, and someone needs to stay with them for 24 hours.  NO VISITORS WILL BE  ALLOWED IN PRE-OP WHERE PATIENTS ARE PREPPED FOR SURGERY.  ONLY 1 SUPPORT PERSON MAY BE PRESENT IN THE WAITING ROOM WHILE YOU ARE IN SURGERY.  IF YOU ARE TO BE ADMITTED, ONCE YOU ARE IN YOUR ROOM YOU WILL BE ALLOWED TWO (2) VISITORS. 1 (ONE) VISITOR MAY STAY OVERNIGHT BUT MUST ARRIVE TO THE ROOM BY 8pm.  Minor children may have two parents present. Special consideration for safety and communication needs will be reviewed on a case by case basis.  Special instructions:    Oral Hygiene is also important to reduce your risk of infection.  Remember - BRUSH YOUR TEETH THE MORNING OF SURGERY WITH YOUR REGULAR TOOTHPASTE   [facility]- Preparing For Surgery  Before surgery, you can play an important role. Because skin is not sterile, your skin needs to be as free of germs as possible. You can reduce the number of germs on your skin by washing with CHG (chlorahexidine gluconate) Soap before surgery.  CHG is an antiseptic cleaner which kills germs and bonds with the skin to continue killing germs even after washing.     Please do not use if you have an allergy to CHG or antibacterial soaps. If your skin becomes reddened/irritated stop using the CHG.  Do not shave (including legs and underarms) for at least 48 hours prior to first CHG shower. It is OK to shave your face.  Please  follow these instructions carefully.     Shower the NIGHT BEFORE SURGERY and the MORNING OF SURGERY with CHG Soap.   If you chose to wash your hair, wash your hair first as usual with your normal shampoo. After you shampoo, rinse your hair and body thoroughly to remove the shampoo.  Then [employer] and genitals (private parts) with your normal soap and rinse thoroughly to remove soap.  After that Use CHG Soap as you would any other liquid soap. You can apply CHG directly to the skin and wash gently with a scrungie or a clean washcloth.   Apply the CHG Soap to your body ONLY FROM THE NECK DOWN.  Do not use on open wounds or open  sores. Avoid contact with your eyes, ears, mouth and genitals (private parts). Wash Face and genitals (private parts)  with your normal soap.   Wash thoroughly, paying special attention to the area where your surgery will be performed.  Thoroughly rinse your body with warm water from the neck down.  DO NOT shower/wash with your normal soap after using and rinsing off the CHG Soap.  Pat yourself dry with a CLEAN TOWEL.  Wear CLEAN PAJAMAS to bed the night before surgery  Place CLEAN SHEETS on your bed the night before your surgery  DO NOT SLEEP WITH PETS.   Day of Surgery:  Take a shower with CHG soap. Wear Clean/Comfortable clothing the morning of surgery Do not apply any deodorants/lotions.   Remember to brush your teeth WITH YOUR REGULAR TOOTHPASTE.   Please read over the following fact sheets that you were given.

## 2021-07-08 ENCOUNTER — Encounter (HOSPITAL_COMMUNITY): Payer: Self-pay

## 2021-07-08 ENCOUNTER — Encounter (HOSPITAL_COMMUNITY)
Admission: RE | Admit: 2021-07-08 | Discharge: 2021-07-08 | Disposition: A | Discharge status: Home / Self Care | Source: Ambulatory Visit | Attending: General Surgery | Admitting: General Surgery

## 2021-07-08 ENCOUNTER — Other Ambulatory Visit: Payer: Self-pay

## 2021-07-08 DIAGNOSIS — Z01818 Encounter for other preprocedural examination: Secondary | ICD-10-CM | POA: Diagnosis not present

## 2021-07-08 DIAGNOSIS — E119 Type 2 diabetes mellitus without complications: Secondary | ICD-10-CM | POA: Insufficient documentation

## 2021-07-08 HISTORY — DX: Prediabetes: R73.03

## 2021-07-08 HISTORY — DX: Personal history of urinary calculi: Z87.442

## 2021-07-08 LAB — BASIC METABOLIC PANEL
Anion gap: 6 (ref 5–15)
BUN: 14 mg/dL (ref 8–23)
CO2: 30 mmol/L (ref 22–32)
Calcium: 9.3 mg/dL (ref 8.9–10.3)
Chloride: 103 mmol/L (ref 98–111)
Creatinine, Ser: 1.02 mg/dL (ref 0.61–1.24)
GFR, Estimated: >60 mL/min (ref >60)
Glucose, Bld: 133 mg/dL — ABNORMAL HIGH (ref 70–99)
Potassium: 4 mmol/L (ref 3.5–5.1)
Sodium: 139 mmol/L (ref 135–145)

## 2021-07-08 LAB — CBC
HCT: 44.7 % (ref 39.0–52.0)
Hemoglobin: 15.4 g/dL (ref 13.0–17.0)
MCH: 31 pg (ref 26.0–34.0)
MCHC: 34.5 g/dL (ref 30.0–36.0)
MCV: 90.1 fL (ref 80.0–100.0)
Platelets: 191 10*3/uL (ref 150–400)
RBC: 4.96 MIL/uL (ref 4.22–5.81)
RDW: 13.4 % (ref 11.5–15.5)
WBC: 7.9 10*3/uL (ref 4.0–10.5)
nRBC: 0 % (ref 0.0–0.2)

## 2021-07-08 LAB — GLUCOSE, CAPILLARY: Glucose-Capillary: 146 mg/dL — ABNORMAL HIGH (ref 70–99)

## 2021-07-08 LAB — HEMOGLOBIN A1C
Hgb A1c MFr Bld: 6.1 % — ABNORMAL HIGH (ref 4.8–5.6)
Mean Plasma Glucose: 128.37 mg/dL

## 2021-07-08 NOTE — Progress Notes (Signed)
PCP - Ricardo Jericho  Cardiologist - denies  Chest x-ray - n/a EKG - 07/08/21  SA - yes, does not wear CPAP  Pre-diabetic - does not check sugars at home  ERAS Protcol - yes, G2 ordered & given    COVID TEST- n/a (ambulatory)   Anesthesia review: yes, abnormal EKG  Patient denies shortness of breath, fever, cough and chest pain at PAT appointment   All instructions explained to the patient, with a verbal understanding of the material. Patient agrees to go over the instructions while at home for a better understanding. Patient also instructed to self quarantine after being tested for COVID-19. The opportunity to ask questions was provided.

## 2021-07-09 ENCOUNTER — Encounter (HOSPITAL_COMMUNITY): Payer: Self-pay

## 2021-07-09 NOTE — Progress Notes (Signed)
Anesthesia Chart Review:  Case: [id number] Date/Time: 07/14/21 0715   Procedure: SUPRA-UMBILICAL HERNIA REPAIR WITH MESH   Anesthesia type: General   Pre-op diagnosis: SUPRAUMBILICAL HERNIA   Location: [facility] OR ROOM 02 / [place] OR   Surgeons: [name], MD       DISCUSSION: Patient is a 65 year old male scheduled for the above procedure.  History includes former smoker (quit 12/02/09; quit smokeless 08/23/13), HTN, HLD, prediabetes, GERD, BPH, OSA (mild OSA 2015, intolerant to CPAP), hepatic steatosis, nephrolithiasis, IBS, diverticulitis (s/p laparoscopic assisted sigmoid colectomy 09/15/15; recurrent episode 03/2021), hernia (s/p right IHR 05/06/03; incisional hernia repair 04/22/20), skin cancer (BCC). BMI is consistent with obesity. Current alcohol use is about 1-2 beers/week. BMI is consistent with obesity.   Last visit with Dr. Anitra Lauth on 04/17/21. He notes that patient had cancelled his umbilical hernia repair due to high anxiety. Apparently, he previously was drinking about 6 oz of liquor daily, and became concerned when AST and ALT became minimally elevated at 48 and 59, respectively in June. Viral hepatitis panel was negative. He said is he now only drinking about 1-2 beers a week. When I spoke with him on the phone he reports feeling a lot of responsible as the primary caregiver to his wife who in on hemodialysis TTS and has a history of DM, CVA, and heart transplant. His sister is going to stay with them several days to help out while he is recovering from hernia surgery.   He denied chest pain or SOB. He does have chronic "sinus" issues and takes Zyrtec daily and as needed Afrin nasal spray. While his wife is in dialysis, he typically walks at least a mile. No syncope. He does get mild ankle edema that is more notable after being up on his feet, but has not had any SOB, chest pain, or syncope. No known CAD, MI, or CHF history. EKG reviewed with anesthesiologist Myrtie Soman, MD with now new  preoperative recommendations.   Anesthesia team to evaluate on the day of surgery.    VS: BP 140/75   Pulse 85   Temp 36.8 C (Oral)   Resp 18   Ht 5\' 5"  (1.651 m)   Wt 109 kg   SpO2 99%   BMI 39.97 kg/m    PROVIDERS: [name], MD is PCP  Rexene Alberts, MD is urologist. 06/03/21 office visit note scanned under Media tab. Seen for BPH with LUTS and renal calculus. Patient opted for ongoing surveillance. Follow-up in six months planned.   LABS: Labs reviewed: Acceptable for surgery. (all labs ordered are listed, but only abnormal results are displayed)  Labs Reviewed  GLUCOSE, CAPILLARY - Abnormal; Notable for the following components:      Result Value   Glucose-Capillary 146 (*)    All other components within normal limits  BASIC METABOLIC PANEL - Abnormal; Notable for the following components:   Glucose, Bld 133 (*)    All other components within normal limits  HEMOGLOBIN A1C - Abnormal; Notable for the following components:   Hgb A1c MFr Bld 6.1 (*)    All other components within normal limits  CBC    Sleep study 06/06/14: IMPRESSION/ RECOMMENDATION:   1) mild obstructive sleep apnea/hypopnea syndrome, with an AHI of 12 events per hour and oxygen desaturation as low as 79%. Treatment for this degree of sleep apnea can include a trial of weight loss alone, upper airway surgery, dental appliance, and also CPAP. Clinical correlation is suggested. 2) occasional PVC noted,  but no clinically significant arrhythmias were seen    IMAGES: US Renal 04/20/21:  IMPRESSION: 1. A 6 mm nonobstructing right renal inferior pole calculus. 2. Right extrarenal pelvis with mild pelviectasis. 3. Small left renal cyst. No hydronephrosis or nephrolithiasis on the left.    EKG: Normal sinus rhythm Incomplete right bundle branch block Septal infarct , age undetermined New since previous tracing Abnormal ECG Confirmed by Virl Axe [phone]) on 07/08/2021 7:29:17 PM   CV:  N/A  Past Medical History:  Diagnosis Date   Allergic rhinitis    Anxiety and depression    BPH (benign prostatic hyperplasia)    Nocturia is his primary symptom:  Takes Flomax   Diverticulitis large intestine 2015/16   Recurrent: GI MD [name].  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   Eustachian tube dysfunction    with recurrent "sinus issues"--ENT (Dr. [name]) recommended surgery for nasal turbinate reduction and septoplasty--as of 11/03/15)   GERD (gastroesophageal reflux disease)    Hepatic steatosis    History of adenomatous polyp of colon    History of kidney stones    Hyperlipidemia    Hypertension    Started med 03/2018   IBS (irritable bowel syndrome)    Inguinal pain, left    Chronic; [name] to do MRI pelvis w/out contrast 08/2017 but insurance denied it.   Nephrolithiasis 12/23/2014   3mm nonobstructive R renal calc on CT abd/pelv done for diverticulitis. 04/2021 6 mm nonobstructing stone R kidney, slight R renal pelviectasis, no hydronephrosis.   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  Recall 07/2017 per [name]   COLONOSCOPY  08/24/2017   Diverticulosis from transverse colon to sigmoid.  Otherwise normal.  Repeat in 5-10 years for screening/[name]   ESOPHAGOGASTRODUODENOSCOPY     need specifics from pt   HERNIA REPAIR     multiple.  Right inguinal repair by Dr. [name] in 2004.  07/2017 having some muscular/nerve pain in groin--evaluated by Dr. Alice Reichert w/u at that time.   INCISIONAL HERNIA REPAIR N/A 04/22/2020   Procedure: HERNIA REPAIR INCISIONAL WITH MESH;  Surgeon: [name], MD;  Location: [place];  Service: General;  Laterality: N/A;    LAPAROSCOPIC SIGMOID COLECTOMY N/A 09/15/2015   Procedure: LAPAROSCOPIC ASSISTED  SIGMOID COLECTOMY;  Surgeon: [name], MD;  Location: [place];  Service: General;  Laterality: N/A;   TONSILLECTOMY      MEDICATIONS:  acetaminophen (TYLENOL) 325 MG tablet   amLODipine-benazepril (LOTREL) 10-20 MG capsule   atorvastatin (LIPITOR) 20 MG tablet   cetirizine (ZYRTEC) 10 MG tablet   Lidocaine (SALONPAS PAIN RELIEVING) 4 % PTCH   Melatonin 10 MG TABS   Multiple Vitamins-Minerals (MULTIVITAMIN WITH MINERALS) tablet   Omega-3 Fatty Acids (FISH OIL) 1200 MG CAPS   oxymetazoline (AFRIN) 0.05 % nasal spray   pantoprazole (PROTONIX) 40 MG tablet   tamsulosin (FLOMAX) 0.4 MG CAPS capsule   No current facility-administered medications for this encounter.    Myra Gianotti, PA-C Surgical Short Stay/Anesthesiology [facility] Phone [phone] [facility] Phone [phone] 07/09/2021 5:59 PM

## 2021-07-09 NOTE — Anesthesia Preprocedure Evaluation (Addendum)
Anesthesia Evaluation  Patient identified by MRN, date of birth, ID band Patient awake    Reviewed: Allergy & Precautions, NPO status , Patient's Chart, lab work & pertinent test results  Airway Mallampati: II  TM Distance: >3 FB     Dental   Pulmonary sleep apnea , former smoker,    breath sounds clear to auscultation       Cardiovascular hypertension,  Rhythm:Regular     Neuro/Psych    GI/Hepatic Neg liver ROS, GERD  ,  Endo/Other  negative endocrine ROS  Renal/GU Renal disease     Musculoskeletal   Abdominal   Peds  Hematology   Anesthesia Other Findings   Reproductive/Obstetrics                           Anesthesia Physical Anesthesia Plan  ASA: 3  Anesthesia Plan: General   Post-op Pain Management:    Induction: Intravenous  PONV Risk Score and Plan: 2  Airway Management Planned: Oral ETT  Additional Equipment:   Intra-op Plan:   Post-operative Plan: Extubation in OR  Informed Consent: I have reviewed the patients History and Physical, chart, labs and discussed the procedure including the risks, benefits and alternatives for the proposed anesthesia with the patient or authorized representative who has indicated his/her understanding and acceptance.     Dental advisory given  Plan Discussed with: CRNA and Anesthesiologist  Anesthesia Plan Comments: (PAT note written 07/09/2021 by Myra Gianotti, PA-C. )      Anesthesia Quick Evaluation

## 2021-07-14 ENCOUNTER — Ambulatory Visit (HOSPITAL_COMMUNITY): Admitting: Vascular Surgery

## 2021-07-14 ENCOUNTER — Ambulatory Visit (HOSPITAL_COMMUNITY)
Admission: RE | Admit: 2021-07-14 | Discharge: 2021-07-14 | Disposition: A | Discharge status: Home / Self Care | Source: Ambulatory Visit | Attending: General Surgery | Admitting: General Surgery

## 2021-07-14 ENCOUNTER — Ambulatory Visit (HOSPITAL_COMMUNITY): Admitting: Anesthesiology

## 2021-07-14 ENCOUNTER — Encounter (HOSPITAL_COMMUNITY): Payer: Self-pay | Admitting: General Surgery

## 2021-07-14 ENCOUNTER — Other Ambulatory Visit: Payer: Self-pay

## 2021-07-14 ENCOUNTER — Encounter (HOSPITAL_COMMUNITY)
Admission: RE | Disposition: A | Discharge status: Home / Self Care | Payer: Self-pay | Source: Ambulatory Visit | Attending: General Surgery

## 2021-07-14 DIAGNOSIS — Z85828 Personal history of other malignant neoplasm of skin: Secondary | ICD-10-CM | POA: Insufficient documentation

## 2021-07-14 DIAGNOSIS — Z87891 Personal history of nicotine dependence: Secondary | ICD-10-CM | POA: Insufficient documentation

## 2021-07-14 DIAGNOSIS — G4733 Obstructive sleep apnea (adult) (pediatric): Secondary | ICD-10-CM | POA: Insufficient documentation

## 2021-07-14 DIAGNOSIS — E669 Obesity, unspecified: Secondary | ICD-10-CM | POA: Insufficient documentation

## 2021-07-14 DIAGNOSIS — Z79899 Other long term (current) drug therapy: Secondary | ICD-10-CM | POA: Insufficient documentation

## 2021-07-14 DIAGNOSIS — Z6839 Body mass index (BMI) 39.0-39.9, adult: Secondary | ICD-10-CM | POA: Diagnosis not present

## 2021-07-14 DIAGNOSIS — E785 Hyperlipidemia, unspecified: Secondary | ICD-10-CM | POA: Insufficient documentation

## 2021-07-14 DIAGNOSIS — K432 Incisional hernia without obstruction or gangrene: Secondary | ICD-10-CM | POA: Insufficient documentation

## 2021-07-14 DIAGNOSIS — N400 Enlarged prostate without lower urinary tract symptoms: Secondary | ICD-10-CM | POA: Insufficient documentation

## 2021-07-14 DIAGNOSIS — K219 Gastro-esophageal reflux disease without esophagitis: Secondary | ICD-10-CM | POA: Diagnosis not present

## 2021-07-14 DIAGNOSIS — Z86010 Personal history of colonic polyps: Secondary | ICD-10-CM | POA: Insufficient documentation

## 2021-07-14 DIAGNOSIS — I10 Essential (primary) hypertension: Secondary | ICD-10-CM | POA: Diagnosis not present

## 2021-07-14 DIAGNOSIS — K760 Fatty (change of) liver, not elsewhere classified: Secondary | ICD-10-CM | POA: Diagnosis not present

## 2021-07-14 DIAGNOSIS — K589 Irritable bowel syndrome without diarrhea: Secondary | ICD-10-CM | POA: Diagnosis not present

## 2021-07-14 HISTORY — PX: SUPRA-UMBILICAL HERNIA: SHX6105

## 2021-07-14 LAB — GLUCOSE, CAPILLARY: Glucose-Capillary: 124 mg/dL — ABNORMAL HIGH (ref 70–99)

## 2021-07-14 SURGERY — SUPRA-UMBILICAL HERNIA
Anesthesia: General

## 2021-07-14 MED ORDER — MIDAZOLAM HCL 2 MG/2ML IJ SOLN
INTRAMUSCULAR | Status: AC
  Filled 2021-07-14: qty 2

## 2021-07-14 MED ORDER — CHLORHEXIDINE GLUCONATE 0.12 % MT SOLN
OROMUCOSAL | Status: AC
  Administered 2021-07-14: 15 mL via OROMUCOSAL
  Filled 2021-07-14: qty 15

## 2021-07-14 MED ORDER — ENSURE PRE-SURGERY PO LIQD: 296.0000 mL | Freq: Once | ORAL | Status: DC

## 2021-07-14 MED ORDER — LACTATED RINGERS IV SOLN: INTRAVENOUS | Status: DC

## 2021-07-14 MED ORDER — PHENYLEPHRINE 40 MCG/ML (10ML) SYRINGE FOR IV PUSH (FOR BLOOD PRESSURE SUPPORT)
PREFILLED_SYRINGE | INTRAVENOUS | Status: DC | PRN
  Administered 2021-07-14: 80 ug via INTRAVENOUS
  Administered 2021-07-14: 40 ug via INTRAVENOUS
  Administered 2021-07-14: 80 ug via INTRAVENOUS

## 2021-07-14 MED ORDER — BUPIVACAINE-EPINEPHRINE 0.5% -1:200000 IJ SOLN
INTRAMUSCULAR | Status: AC
  Filled 2021-07-14: qty 1

## 2021-07-14 MED ORDER — MIDAZOLAM HCL 2 MG/2ML IJ SOLN
INTRAMUSCULAR | Status: DC | PRN
  Administered 2021-07-14: 2 mg via INTRAVENOUS

## 2021-07-14 MED ORDER — DEXAMETHASONE SODIUM PHOSPHATE 10 MG/ML IJ SOLN
INTRAMUSCULAR | Status: AC
  Filled 2021-07-14: qty 1

## 2021-07-14 MED ORDER — CHLORHEXIDINE GLUCONATE 0.12 % MT SOLN: 15.0000 mL | Freq: Once | OROMUCOSAL | Status: DC

## 2021-07-14 MED ORDER — ACETAMINOPHEN 325 MG PO TABS: 325.0000 mg | ORAL_TABLET | Freq: Once | ORAL | Status: AC

## 2021-07-14 MED ORDER — LIDOCAINE 2% (20 MG/ML) 5 ML SYRINGE
INTRAMUSCULAR | Status: AC
  Filled 2021-07-14: qty 5

## 2021-07-14 MED ORDER — CHLORHEXIDINE GLUCONATE 0.12 % MT SOLN: 15.0000 mL | Freq: Once | OROMUCOSAL | Status: AC

## 2021-07-14 MED ORDER — EPHEDRINE 5 MG/ML INJ
INTRAVENOUS | Status: AC
  Filled 2021-07-14: qty 5

## 2021-07-14 MED ORDER — CHLORHEXIDINE GLUCONATE CLOTH 2 % EX PADS: 6.0000 | MEDICATED_PAD | Freq: Once | CUTANEOUS | Status: DC

## 2021-07-14 MED ORDER — ROCURONIUM BROMIDE 10 MG/ML (PF) SYRINGE
PREFILLED_SYRINGE | INTRAVENOUS | Status: DC | PRN
  Administered 2021-07-14: 60 mg via INTRAVENOUS

## 2021-07-14 MED ORDER — ORAL CARE MOUTH RINSE
15.0000 mL | Freq: Once | OROMUCOSAL | Status: AC
Start: 2021-07-14 — End: 2021-07-14

## 2021-07-14 MED ORDER — PROPOFOL 10 MG/ML IV BOLUS
INTRAVENOUS | Status: AC
  Filled 2021-07-14: qty 40

## 2021-07-14 MED ORDER — FENTANYL CITRATE (PF) 250 MCG/5ML IJ SOLN
INTRAMUSCULAR | Status: AC
  Filled 2021-07-14: qty 5

## 2021-07-14 MED ORDER — SUGAMMADEX SODIUM 200 MG/2ML IV SOLN
INTRAVENOUS | Status: DC | PRN
  Administered 2021-07-14: 200 mg via INTRAVENOUS

## 2021-07-14 MED ORDER — CEFAZOLIN SODIUM-DEXTROSE 2-4 GM/100ML-% IV SOLN
2.0000 g | INTRAVENOUS | Status: AC
  Administered 2021-07-14: 2 g via INTRAVENOUS

## 2021-07-14 MED ORDER — LIDOCAINE 2% (20 MG/ML) 5 ML SYRINGE
INTRAMUSCULAR | Status: DC | PRN
  Administered 2021-07-14: 80 mg via INTRAVENOUS

## 2021-07-14 MED ORDER — BUPIVACAINE-EPINEPHRINE 0.5% -1:200000 IJ SOLN
INTRAMUSCULAR | Status: DC | PRN
  Administered 2021-07-14: 20 mL

## 2021-07-14 MED ORDER — SODIUM CHLORIDE 0.9 % IV SOLN
INTRAVENOUS | Status: DC | PRN
  Administered 2021-07-14: 25 ug/min via INTRAVENOUS

## 2021-07-14 MED ORDER — ACETAMINOPHEN 325 MG PO TABS
ORAL_TABLET | ORAL | Status: AC
  Administered 2021-07-14: 325 mg via ORAL
  Filled 2021-07-14: qty 1

## 2021-07-14 MED ORDER — OXYCODONE HCL 5 MG PO TABS
5.0000 mg | ORAL_TABLET | Freq: Once | ORAL | Status: AC
  Administered 2021-07-14: 5 mg via ORAL

## 2021-07-14 MED ORDER — FENTANYL CITRATE (PF) 100 MCG/2ML IJ SOLN: 25.0000 ug | INTRAMUSCULAR | Status: DC | PRN

## 2021-07-14 MED ORDER — PHENYLEPHRINE 40 MCG/ML (10ML) SYRINGE FOR IV PUSH (FOR BLOOD PRESSURE SUPPORT)
PREFILLED_SYRINGE | INTRAVENOUS | Status: AC
  Filled 2021-07-14: qty 10

## 2021-07-14 MED ORDER — FENTANYL CITRATE (PF) 250 MCG/5ML IJ SOLN
INTRAMUSCULAR | Status: DC | PRN
  Administered 2021-07-14 (×3): 50 ug via INTRAVENOUS
  Administered 2021-07-14: 100 ug via INTRAVENOUS

## 2021-07-14 MED ORDER — ACETAMINOPHEN 500 MG PO TABS: 1000.0000 mg | ORAL_TABLET | ORAL | Status: DC

## 2021-07-14 MED ORDER — 0.9 % SODIUM CHLORIDE (POUR BTL) OPTIME
TOPICAL | Status: DC | PRN
  Administered 2021-07-14: 1000 mL

## 2021-07-14 MED ORDER — SODIUM CHLORIDE 0.9 % IV SOLN: INTRAVENOUS | Status: DC

## 2021-07-14 MED ORDER — CEFAZOLIN SODIUM-DEXTROSE 2-4 GM/100ML-% IV SOLN
INTRAVENOUS | Status: AC
  Filled 2021-07-14: qty 100

## 2021-07-14 MED ORDER — ORAL CARE MOUTH RINSE: 15.0000 mL | Freq: Once | OROMUCOSAL | Status: DC

## 2021-07-14 MED ORDER — OXYCODONE HCL 5 MG PO TABS: 5.0000 mg | ORAL_TABLET | Freq: Four times a day (QID) | ORAL | 0 refills | Status: DC | PRN

## 2021-07-14 MED ORDER — ONDANSETRON HCL 4 MG/2ML IJ SOLN
INTRAMUSCULAR | Status: DC | PRN
Start: 2021-07-14 — End: 2021-07-14
  Administered 2021-07-14: 4 mg via INTRAVENOUS

## 2021-07-14 MED ORDER — DEXAMETHASONE SODIUM PHOSPHATE 10 MG/ML IJ SOLN
INTRAMUSCULAR | Status: DC | PRN
  Administered 2021-07-14: 5 mg via INTRAVENOUS

## 2021-07-14 MED ORDER — GABAPENTIN 300 MG PO CAPS
300.0000 mg | ORAL_CAPSULE | ORAL | Status: AC
Start: 2021-07-14 — End: 2021-07-14

## 2021-07-14 MED ORDER — ONDANSETRON HCL 4 MG/2ML IJ SOLN
INTRAMUSCULAR | Status: AC
  Filled 2021-07-14: qty 2

## 2021-07-14 MED ORDER — ACETAMINOPHEN 500 MG PO TABS
ORAL_TABLET | ORAL | Status: AC
  Filled 2021-07-14: qty 2

## 2021-07-14 MED ORDER — PROPOFOL 10 MG/ML IV BOLUS
INTRAVENOUS | Status: DC | PRN
  Administered 2021-07-14: 160 mg via INTRAVENOUS

## 2021-07-14 MED ORDER — EPHEDRINE SULFATE-NACL 50-0.9 MG/10ML-% IV SOSY
PREFILLED_SYRINGE | INTRAVENOUS | Status: DC | PRN
  Administered 2021-07-14: 2.5 mg via INTRAVENOUS

## 2021-07-14 MED ORDER — BUPIVACAINE-EPINEPHRINE (PF) 0.25% -1:200000 IJ SOLN
INTRAMUSCULAR | Status: AC
  Filled 2021-07-14: qty 30

## 2021-07-14 MED ORDER — GABAPENTIN 300 MG PO CAPS
ORAL_CAPSULE | ORAL | Status: AC
  Administered 2021-07-14: 300 mg via ORAL
  Filled 2021-07-14: qty 1

## 2021-07-14 MED ORDER — OXYCODONE HCL 5 MG PO TABS
ORAL_TABLET | ORAL | Status: AC
  Filled 2021-07-14: qty 1

## 2021-07-14 MED ORDER — ROCURONIUM BROMIDE 10 MG/ML (PF) SYRINGE
PREFILLED_SYRINGE | INTRAVENOUS | Status: AC
  Filled 2021-07-14: qty 10

## 2021-07-14 SURGICAL SUPPLY — 41 items
ADH SKN CLS APL DERMABOND .7 (GAUZE/BANDAGES/DRESSINGS) ×1
APL PRP STRL LF DISP 70% ISPRP (MISCELLANEOUS) ×1
BAG COUNTER SPONGE SURGICOUNT (BAG) ×2 IMPLANT
BAG SPNG CNTER NS LX DISP (BAG) ×1
BLADE CLIPPER SURG (BLADE) ×1 IMPLANT
CANISTER SUCT 3000ML PPV (MISCELLANEOUS) ×2 IMPLANT
CHLORAPREP W/TINT 26 (MISCELLANEOUS) ×2 IMPLANT
CNTNR URN SCR LID CUP LEK RST (MISCELLANEOUS) IMPLANT
CONT SPEC 4OZ STRL OR WHT (MISCELLANEOUS) ×2
COVER SURGICAL LIGHT HANDLE (MISCELLANEOUS) ×2 IMPLANT
DERMABOND ADVANCED (GAUZE/BANDAGES/DRESSINGS) ×1
DERMABOND ADVANCED .7 DNX12 (GAUZE/BANDAGES/DRESSINGS) ×1 IMPLANT
DRAPE LAPAROTOMY 100X72 PEDS (DRAPES) ×2 IMPLANT
ELECT REM PT RETURN 9FT ADLT (ELECTROSURGICAL) ×2
ELECTRODE REM PT RTRN 9FT ADLT (ELECTROSURGICAL) ×1 IMPLANT
GLOVE SRG 8 PF TXTR STRL LF DI (GLOVE) ×1 IMPLANT
GLOVE SURG ENC MOIS LTX SZ8 (GLOVE) ×2 IMPLANT
GLOVE SURG UNDER POLY LF SZ8 (GLOVE) ×2
GOWN STRL REUS W/ TWL LRG LVL3 (GOWN DISPOSABLE) ×1 IMPLANT
GOWN STRL REUS W/ TWL XL LVL3 (GOWN DISPOSABLE) ×1 IMPLANT
GOWN STRL REUS W/TWL LRG LVL3 (GOWN DISPOSABLE) ×2
GOWN STRL REUS W/TWL XL LVL3 (GOWN DISPOSABLE) ×2
KIT BASIN OR (CUSTOM PROCEDURE TRAY) ×2 IMPLANT
KIT TURNOVER KIT B (KITS) ×2 IMPLANT
MESH VENTRALEX ST 2.5 CRC MED (Mesh General) ×1 IMPLANT
NEEDLE 22X1 1/2 (OR ONLY) (NEEDLE) ×2 IMPLANT
NS IRRIG 1000ML POUR BTL (IV SOLUTION) ×2 IMPLANT
PACK GENERAL/GYN (CUSTOM PROCEDURE TRAY) ×2 IMPLANT
PAD ARMBOARD 7.5X6 YLW CONV (MISCELLANEOUS) ×2 IMPLANT
PENCIL SMOKE EVACUATOR (MISCELLANEOUS) ×2 IMPLANT
SPONGE T-LAP 18X18 ~~LOC~~+RFID (SPONGE) ×2 IMPLANT
SUT MNCRL AB 4-0 PS2 18 (SUTURE) ×2 IMPLANT
SUT PROLENE 0 CT 1 30 (SUTURE) ×4 IMPLANT
SUT VIC AB 2-0 CT1 27 (SUTURE) ×2
SUT VIC AB 2-0 CT1 TAPERPNT 27 (SUTURE) ×1 IMPLANT
SUT VIC AB 3-0 SH 27 (SUTURE) ×2
SUT VIC AB 3-0 SH 27XBRD (SUTURE) ×1 IMPLANT
SUT VICRYL AB 2 0 TIES (SUTURE) ×1 IMPLANT
SYR CONTROL 10ML LL (SYRINGE) ×2 IMPLANT
TOWEL GREEN STERILE (TOWEL DISPOSABLE) ×2 IMPLANT
TOWEL GREEN STERILE FF (TOWEL DISPOSABLE) ×2 IMPLANT

## 2021-07-14 NOTE — Op Note (Addendum)
  07/14/2021  8:59 AM  PATIENT:  Kevin Young  66 y.o. male  PRE-OPERATIVE DIAGNOSIS:  SUPRAUMBILICAL INCISIONAL HERNIA  POST-OPERATIVE DIAGNOSIS:  SUPRAUMBILICAL INCISIONAL HERNIA  PROCEDURE:  Procedure(s): REPAIR SUPRA-UMBILICAL INCISIONAL HERNIA WITH MESH  SURGEON:  Surgeon(s): [name], MD  ASSISTANTS: Genella Mech, MD   ANESTHESIA:   local and general  EBL:  Total I/O In: -  Out: 56 [Blood:50]  BLOOD ADMINISTERED:none  DRAINS: none   SPECIMEN:  Excision  DISPOSITION OF SPECIMEN:  PATHOLOGY  COUNTS:  YES  DICTATION: .Dragon Dictation Findings: Supraumbilical incisional hernia containing omentum  Procedure in detail: Informed consent was obtained.  The patient received intravenous antibiotics.  He was brought to the operating room and general endotracheal anesthesia was administered by the anesthesia staff.  His abdomen was prepped and draped in a sterile fashion.  We did a timeout procedure.  Local anesthetic was injected above the umbilicus.  A vertical midline incision was made following his previous scar.  Subcutaneous tissues were dissected down revealing the hernia sac.  This was circumferentially dissected off of the fascia.  The sac was entered and it contained omentum.  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  The sac was excised circumferentially off the fascia.  We then excised a portion of the omentum by clamping it with Kelly clamps, dividing it, and tying it securely with Vicryl sutures.  There was excellent hemostasis.  We circumferentially cleared the fascia to facilitate repair.  Hemostasis was ensured.  The repair was then completed with a 6.4 cm circular Ventralight mesh.  The mesh was inserted and positioned so it laid out nicely against the inside of the abdominal wall as an inlay.  The superior and inferior leaflets were secured to the fascia using interrupted 0 Prolene.  We then closed the fascia over the top of the mesh with multiple  interrupted 0 Prolene including the mesh with each suture.  The area was copiously irrigated.  Some additional local was injected.  Hemostasis was ensured.  Deep tissues were approximated with interrupted 2-0 Vicryl and the skin was closed with running 4-0 Monocryl followed by Dermabond.  All counts were correct.  He tolerated the procedure well without apparent complication and was taken recovery in stable condition.  PATIENT DISPOSITION:  PACU - hemodynamically stable.   Delay start of Pharmacological VTE agent (>24hrs) due to surgical blood loss or risk of bleeding:  no   I was personally present during the key and critical portions of this procedure and immediately available throughout the entire procedure, as documented in my operative note.   [name], MD, MPH, FACS Pager: [phone]  10/11/20228:59 AM

## 2021-07-14 NOTE — Transfer of Care (Signed)
Immediate Anesthesia Transfer of Care Note  Patient: [name]  Procedure(s) Performed: SUPRA-UMBILICAL HERNIA REPAIR WITH MESH  Patient Location: PACU  Anesthesia Type:General  Level of Consciousness: awake and alert   Airway & Oxygen Therapy: Patient Spontanous Breathing and Patient connected to face mask oxygen  Post-op Assessment: Report given to RN and Post -op Vital signs reviewed and stable  Post vital signs: Reviewed and stable  Last Vitals:  Vitals Value Taken Time  BP    Temp    Pulse 70 07/14/21 0910  Resp    SpO2 100 % 07/14/21 0910  Vitals shown include unvalidated device data.  Last Pain:  Vitals:   07/14/21 0629  TempSrc:   PainSc: 0-No pain         Complications: No notable events documented.

## 2021-07-14 NOTE — Anesthesia Procedure Notes (Addendum)
Procedure Name: Intubation Date/Time: 07/14/2021 7:36 AM Performed by: Ardyth Harps, CRNA Pre-anesthesia Checklist: Patient identified, Emergency Drugs available, Suction available and Patient being monitored Patient Re-evaluated:Patient Re-evaluated prior to induction Oxygen Delivery Method: Circle System Utilized Preoxygenation: Pre-oxygenation with 100% oxygen Induction Type: IV induction Ventilation: Oral airway inserted - appropriate to patient size and Two handed mask ventilation required Laryngoscope Size: Mac and 4 Grade View: Grade I Tube type: Oral Tube size: 7.5 mm Number of attempts: 1 Airway Equipment and Method: Stylet and Oral airway Placement Confirmation: ETT inserted through vocal cords under direct vision, positive ETCO2 and breath sounds checked- equal and bilateral Secured at: 23 cm Tube secured with: Tape Dental Injury: Teeth and Oropharynx as per pre-operative assessment

## 2021-07-14 NOTE — Anesthesia Postprocedure Evaluation (Signed)
Anesthesia Post Note  Patient: Kevin Young  Procedure(s) Performed: SUPRA-UMBILICAL HERNIA REPAIR WITH MESH     Patient location during evaluation: PACU Anesthesia Type: General Level of consciousness: awake Pain management: pain level controlled Vital Signs Assessment: post-procedure vital signs reviewed and stable Respiratory status: spontaneous breathing Cardiovascular status: stable Postop Assessment: no apparent nausea or vomiting Anesthetic complications: no   No notable events documented.  Last Vitals:  Vitals:   07/14/21 0555 07/14/21 0910  BP: (!) 148/76 138/76  Pulse: 71 70  Resp: 18 20  Temp: 36.8 C 36.4 C  SpO2: 95% 100%    Last Pain:  Vitals:   07/14/21 0910  TempSrc:   PainSc: 7                  Nadean Montanaro

## 2021-07-14 NOTE — H&P (Signed)
Kevin Young is an 65 y.o. male.   Chief Complaint: supraumbilical hernia HPI: 58QI M presents for reapir of supraumbilical hernia with mesh. He reports it has been uncomfortable and is sticking out more.  Past Medical History:  Diagnosis Date   Allergic rhinitis    Anxiety and depression    BPH (benign prostatic hyperplasia)    Nocturia is his primary symptom:  Takes Flomax   Diverticulitis large intestine 2015/16   Recurrent: GI MD [name].  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   Eustachian tube dysfunction    with recurrent "sinus issues"--ENT (Dr. [name]) recommended surgery for nasal turbinate reduction and septoplasty--as of 11/03/15)   GERD (gastroesophageal reflux disease)    Hepatic steatosis    History of adenomatous polyp of colon    History of kidney stones    Hyperlipidemia    Hypertension    Started med 03/2018   IBS (irritable bowel syndrome)    Inguinal pain, left    Chronic; [name] to do MRI pelvis w/out contrast 08/2017 but insurance denied it.   Nephrolithiasis 12/23/2014   22mm nonobstructive R renal calc on CT abd/pelv done for diverticulitis. 04/2021 6 mm nonobstructing stone R kidney, slight R renal pelviectasis, no hydronephrosis.   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  Recall 07/2017 per [name]   COLONOSCOPY  08/24/2017   Diverticulosis from transverse colon to sigmoid.  Otherwise normal.  Repeat in 5-10 years for screening/[name]   ESOPHAGOGASTRODUODENOSCOPY     need specifics from pt   HERNIA REPAIR     multiple.  Right inguinal repair by Dr. [name] in 2004.  07/2017 having some muscular/nerve pain in groin--evaluated by  Dr. Alice Reichert w/u at that time.   INCISIONAL HERNIA REPAIR N/A 04/22/2020   Procedure: HERNIA REPAIR INCISIONAL WITH MESH;  Surgeon: [name], MD;  Location: [facility];  Service: General;  Laterality: N/A;   LAPAROSCOPIC SIGMOID COLECTOMY N/A 09/15/2015   Procedure: LAPAROSCOPIC ASSISTED  SIGMOID COLECTOMY;  Surgeon: [name], MD;  Location: [facility];  Service: General;  Laterality: N/A;   TONSILLECTOMY      Family History  Problem Relation Age of Onset   GI problems Mother    Cancer Father        skin   Hypertension Father    Glaucoma Father    Migraines Sister    Seizures Brother    Depression Brother    Social History:  reports that he quit smoking about 11 years ago. His smoking use included cigars. He quit smokeless tobacco use about 7 years ago.  His smokeless tobacco use included chew. He reports that he does not currently use alcohol. He reports that he does not use drugs.  Allergies: No Known Allergies  Medications Prior to Admission  Medication Sig Dispense Refill   acetaminophen (TYLENOL) 325 MG tablet Take 650 mg by mouth every 6 (six) hours as needed for moderate pain.     amLODipine-benazepril (LOTREL) 10-20 MG capsule Take 1 capsule by mouth daily. 90 capsule 3   atorvastatin (LIPITOR) 20 MG tablet TAKE 1/2 TABLET(10 MG)  BY MOUTH DAILY 45 tablet 1   cetirizine (ZYRTEC) 10 MG tablet Take 10 mg by mouth daily.     Lidocaine 4 % PTCH Apply 1 patch topically daily as needed (pain).     Melatonin 10 MG TABS Take 10 mg by mouth at bedtime.      Multiple Vitamins-Minerals (MULTIVITAMIN WITH MINERALS) tablet Take 1 tablet by mouth daily.     Omega-3 Fatty Acids (FISH OIL) 1200 MG CAPS Take 1,200-2,400 mg by mouth See admin instructions. Take 2400 mg in the morning and 1200 mg in the evening     oxymetazoline (AFRIN) 0.05 % nasal spray Place 1 spray into both nostrils at bedtime as needed for congestion.     pantoprazole (PROTONIX) 40 MG tablet TAKE 1 TABLET BY MOUTH  EVERY DAY BEFORE BREAKFAST (Patient taking differently: Take 40 mg by mouth daily as needed (acid reflux).) 90 tablet 0   tamsulosin (FLOMAX) 0.4 MG CAPS capsule Take 0.4 mg by mouth at bedtime.      Results for orders placed or performed during the hospital encounter of 07/14/21 (from the past 48 hour(s))  Glucose, capillary     Status: Abnormal   Collection Time: 07/14/21  5:54 AM  Result Value Ref Range   Glucose-Capillary 124 (H) 70 - 99 mg/dL    Comment: Glucose reference range applies only to samples taken after fasting for at least 8 hours.   No results found.  Review of Systems  Blood pressure (!) 148/76, pulse 71, temperature 98.2 F (36.8 C), temperature source Oral, resp. rate 18, height 5\' 5"  (1.651 m), weight 108.9 kg, SpO2 95 %. Physical Exam HENT:     Head: Normocephalic.     Nose: Nose normal.  Eyes:     Pupils: Pupils are equal, round, and reactive to light.  Cardiovascular:     Rate and Rhythm: Normal rate and regular rhythm.  Pulmonary:     Effort: Pulmonary effort is normal.     Breath sounds: Normal breath sounds.  Abdominal:     General: Abdomen is flat.     Palpations: Abdomen is soft.     Comments: Supraumbilical hernia reducible but recurs  Musculoskeletal:        General: Normal range of motion.  Skin:    General: Skin is warm.     Capillary Refill: Capillary refill takes less than 2 seconds.  Neurological:     Mental Status: He is alert and oriented to person, place, and time.  Psychiatric:        Mood and Affect: Mood normal.     Assessment/Plan Supraumbilical hernia - for repair with mesh. Procedure, risks, and benefits again discussed and he agrees.   Zenovia Jarred, MD 07/14/2021, 6:58 AM

## 2021-07-15 ENCOUNTER — Encounter (HOSPITAL_COMMUNITY): Payer: Self-pay | Admitting: General Surgery

## 2021-07-15 LAB — SURGICAL PATHOLOGY

## 2021-08-24 LAB — HM DIABETES EYE EXAM

## 2021-09-13 ENCOUNTER — Other Ambulatory Visit: Payer: Self-pay | Admitting: Family Medicine

## 2021-12-02 ENCOUNTER — Other Ambulatory Visit: Payer: Self-pay | Admitting: Family Medicine

## 2021-12-02 LAB — PSA: PSA: 0.86

## 2021-12-12 ENCOUNTER — Emergency Department (HOSPITAL_BASED_OUTPATIENT_CLINIC_OR_DEPARTMENT_OTHER)

## 2021-12-12 ENCOUNTER — Other Ambulatory Visit: Payer: Self-pay

## 2021-12-12 ENCOUNTER — Emergency Department (HOSPITAL_BASED_OUTPATIENT_CLINIC_OR_DEPARTMENT_OTHER)
Admission: EM | Admit: 2021-12-12 | Discharge: 2021-12-12 | Disposition: A | Discharge status: Home / Self Care | Attending: Emergency Medicine | Admitting: Emergency Medicine

## 2021-12-12 DIAGNOSIS — R1032 Left lower quadrant pain: Secondary | ICD-10-CM | POA: Insufficient documentation

## 2021-12-12 DIAGNOSIS — K5792 Diverticulitis of intestine, part unspecified, without perforation or abscess without bleeding: Secondary | ICD-10-CM

## 2021-12-12 DIAGNOSIS — D72829 Elevated white blood cell count, unspecified: Secondary | ICD-10-CM | POA: Insufficient documentation

## 2021-12-12 LAB — CBC WITH DIFFERENTIAL/PLATELET
Abs Immature Granulocytes: 0.02 10*3/uL (ref 0.00–0.07)
Basophils Absolute: 0 10*3/uL (ref 0.0–0.1)
Basophils Relative: 0 %
Eosinophils Absolute: 0.1 10*3/uL (ref 0.0–0.5)
Eosinophils Relative: 0 %
HCT: 45.6 % (ref 39.0–52.0)
Hemoglobin: 15.4 g/dL (ref 13.0–17.0)
Immature Granulocytes: 0 %
Lymphocytes Relative: 21 %
Lymphs Abs: 2.5 10*3/uL (ref 0.7–4.0)
MCH: 29.8 pg (ref 26.0–34.0)
MCHC: 33.8 g/dL (ref 30.0–36.0)
MCV: 88.4 fL (ref 80.0–100.0)
Monocytes Absolute: 1.3 10*3/uL — ABNORMAL HIGH (ref 0.1–1.0)
Monocytes Relative: 11 %
Neutro Abs: 8.1 10*3/uL — ABNORMAL HIGH (ref 1.7–7.7)
Neutrophils Relative %: 68 %
Platelets: 192 10*3/uL (ref 150–400)
RBC: 5.16 MIL/uL (ref 4.22–5.81)
RDW: 13.3 % (ref 11.5–15.5)
WBC: 12.1 10*3/uL — ABNORMAL HIGH (ref 4.0–10.5)
nRBC: 0 % (ref 0.0–0.2)

## 2021-12-12 LAB — COMPREHENSIVE METABOLIC PANEL
ALT: 31 U/L (ref 0–44)
AST: 22 U/L (ref 15–41)
Albumin: 4.3 g/dL (ref 3.5–5.0)
Alkaline Phosphatase: 49 U/L (ref 38–126)
Anion gap: 9 (ref 5–15)
BUN: 15 mg/dL (ref 8–23)
CO2: 27 mmol/L (ref 22–32)
Calcium: 9.5 mg/dL (ref 8.9–10.3)
Chloride: 103 mmol/L (ref 98–111)
Creatinine, Ser: 1.12 mg/dL (ref 0.61–1.24)
GFR, Estimated: >60 mL/min (ref >60)
Glucose, Bld: 116 mg/dL — ABNORMAL HIGH (ref 70–99)
Potassium: 4.3 mmol/L (ref 3.5–5.1)
Sodium: 139 mmol/L (ref 135–145)
Total Bilirubin: 0.8 mg/dL (ref 0.3–1.2)
Total Protein: 7.7 g/dL (ref 6.5–8.1)

## 2021-12-12 LAB — URINALYSIS, ROUTINE W REFLEX MICROSCOPIC
Bilirubin Urine: NEGATIVE
Glucose, UA: NEGATIVE mg/dL
Hgb urine dipstick: NEGATIVE
Ketones, ur: NEGATIVE mg/dL
Leukocytes,Ua: NEGATIVE
Nitrite: NEGATIVE
Protein, ur: NEGATIVE mg/dL
Specific Gravity, Urine: 1.009 (ref 1.005–1.030)
pH: 6.5 (ref 5.0–8.0)

## 2021-12-12 MED ORDER — OXYCODONE HCL 5 MG PO TABS: 5.0000 mg | ORAL_TABLET | Freq: Four times a day (QID) | ORAL | 0 refills | Status: AC | PRN

## 2021-12-12 MED ORDER — AMOXICILLIN-POT CLAVULANATE 875-125 MG PO TABS: 1.0000 | ORAL_TABLET | Freq: Two times a day (BID) | ORAL | 0 refills | Status: AC

## 2021-12-12 MED ORDER — IOHEXOL 300 MG/ML  SOLN
100.0000 mL | Freq: Once | INTRAMUSCULAR | Status: AC | PRN
  Administered 2021-12-12: 100 mL via INTRAVENOUS

## 2021-12-12 NOTE — ED Triage Notes (Addendum)
Pt c/o recurrent LLQ abdomen pain that radiates to left lower back onset yesterday. Pain typically related to diverticulitis flare up. S/S with gas pains and bloating. Denies NVD. Denies urinary symptoms.  ?

## 2021-12-12 NOTE — ED Provider Notes (Signed)
Patient signed out to me awaiting CT scan.  Suspicion for diverticulitis.  History of the same.  Tender in the left lower quadrant. ? ?Radiology report states acute diverticulitis.  No complications.  There is also some fluid collection within the subcutaneous fat of the lower anterior abdominal wall at old surgical wound.  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  Patient is not tender in this area.  He has no redness or erythema in this area.  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  He was made aware of this and to be mindful of this area but history and physical and CT most consistent with acute diverticulitis causing his discomfort today.  Will treat with antibiotics.  Discharged in good condition.  Understands return precautions. ?  Lennice Sites, DO ?12/12/21 7412 ? ?

## 2021-12-12 NOTE — Discharge Instructions (Signed)
Take antibiotic as prescribed.  Follow-up with your primary care doctor. 

## 2021-12-12 NOTE — ED Notes (Signed)
Patient transported to CT 

## 2021-12-12 NOTE — ED Provider Notes (Signed)
? ?[facility]  ?Provider Note ? ?CSN: [id number] ?Arrival date & time: 12/12/21 0507 ? ?History ?Chief Complaint  ?Patient presents with  ? Abdominal Pain  ? ? ?Kevin Young is a 66 y.o. male with history of diverticulitis, had partial colectomy for same in 2016 reports LLQ and L flank pain for the last couple of days, more severe during the night. Some nausea, no vomiting, diarrhea or hematochezia. He denies any dysuria. Seen by Urology last week and had US showing R sided renal stone, but was told left side was clear.  ? ? ?Home Medications ?Prior to Admission medications   ?Medication Sig Start Date End Date Taking? Authorizing Provider  ?acetaminophen (TYLENOL) 325 MG tablet Take 650 mg by mouth every 6 (six) hours as needed for moderate pain.    [provider]  ?amLODipine-benazepril (LOTREL) 10-20 MG capsule Take 1 capsule by mouth daily. OFFICE VISIT NEEDED FOR FURTHER REFILLS 12/02/21   McGowen, Adrian Blackwater, MD  ?atorvastatin (LIPITOR) 20 MG tablet TAKE 1/2 TABLET(10 MG) BY MOUTH DAILY 06/22/21   McGowen, Adrian Blackwater, MD  ?cetirizine (ZYRTEC) 10 MG tablet Take 10 mg by mouth daily.    [provider]  ?Lidocaine 4 % PTCH Apply 1 patch topically daily as needed (pain).    [provider]  ?Melatonin 10 MG TABS Take 10 mg by mouth at bedtime.     [provider]  ?Multiple Vitamins-Minerals (MULTIVITAMIN WITH MINERALS) tablet Take 1 tablet by mouth daily.    [provider]  ?Omega-3 Fatty Acids (FISH OIL) 1200 MG CAPS Take 1,200-2,400 mg by mouth See admin instructions. Take 2400 mg in the morning and 1200 mg in the evening    [provider]  ?oxyCODONE (OXY IR/ROXICODONE) 5 MG immediate release tablet Take 1 tablet (5 mg total) by mouth every 6 (six) hours as needed for severe pain. 07/14/21   [name], MD  ?oxymetazoline (AFRIN) 0.05 % nasal spray Place 1 spray into both nostrils at bedtime as needed for congestion.     [provider]  ?pantoprazole (PROTONIX) 40 MG tablet TAKE 1 TABLET BY MOUTH EVERY DAY BEFORE BREAKFAST 09/14/21   McGowen, Adrian Blackwater, MD  ?tamsulosin (FLOMAX) 0.4 MG CAPS capsule Take 0.4 mg by mouth at bedtime.    [provider]  ? ? ? ?Allergies    ?Patient has no known allergies. ? ? ?Review of Systems   ?Review of Systems ?Please see HPI for pertinent positives and negatives ? ?Physical Exam ?BP 113/73   Pulse 73   Temp 98.6 ?F (37 ?C)   Resp 17   Ht '5\' 5"'$  (1.651 m)   Wt 108.9 kg   SpO2 97%   BMI 39.94 kg/m?  ? ?Physical Exam ?Vitals and nursing note reviewed.  ?Constitutional:   ?   Appearance: Normal appearance.  ?HENT:  ?   Head: Normocephalic and atraumatic.  ?   Nose: Nose normal.  ?   Mouth/Throat:  ?   Mouth: Mucous membranes are moist.  ?Eyes:  ?   Extraocular Movements: Extraocular movements intact.  ?   Conjunctiva/sclera: Conjunctivae normal.  ?Cardiovascular:  ?   Rate and Rhythm: Normal rate.  ?Pulmonary:  ?   Effort: Pulmonary effort is normal.  ?   Breath sounds: Normal breath sounds.  ?Abdominal:  ?   General: Abdomen is flat.  ?   Palpations: Abdomen is soft.  ?   Tenderness: There is abdominal tenderness in the  left lower quadrant. There is guarding.  ?Musculoskeletal:     ?   General: No swelling. Normal range of motion.  ?   Cervical back: Neck supple.  ?Skin: ?   General: Skin is warm and dry.  ?Neurological:  ?   General: No focal deficit present.  ?   Mental Status: He is alert.  ?Psychiatric:     ?   Mood and Affect: Mood normal.  ? ? ?ED Results / Procedures / Treatments   ?EKG ?None ? ?Procedures ?Procedures ? ?Medications Ordered in the ED ?Medications  ?iohexol (OMNIPAQUE) 300 MG/ML solution 100 mL (100 mLs Intravenous Contrast Given 12/12/21 0645)  ? ? ?Initial Impression and Plan ? Patient with history of diverticulitis, here with LLQ pain/tenderness. Will check labs, send for CT given history of complications.  ? ?ED Course  ? ?Clinical Course as of  12/12/21 0713  ?Sat Dec 12, 2021  ?0551 CBC shows mild leukocytosis.  [CS]  ?0552 UA is neg.  [CS]  ?0638 CMP is unremarkable.  [CS]  ?0707 Care of the patient signed out to Dr. Dondra Spry at the change of shift pending CT.  [CS]  ?  ?Clinical Course User Index ?[CS] Truddie Hidden, MD  ? ? ? ?MDM Rules/Calculators/A&P ?Medical Decision Making ?Amount and/or Complexity of Data Reviewed ?Labs: ordered. ?Radiology: ordered. ? ?Risk ?Prescription drug management. ? ? ? ?Final Clinical Impression(s) / ED Diagnoses ?Final diagnoses:  ?None  ? ? ?Rx / DC Orders ?ED Discharge Orders   ? ? None  ? ?  ? ?  ?Truddie Hidden, MD ?12/12/21 [phone] ? ?

## 2021-12-14 ENCOUNTER — Other Ambulatory Visit: Payer: Self-pay | Admitting: Family Medicine

## 2021-12-22 ENCOUNTER — Other Ambulatory Visit: Payer: Self-pay

## 2021-12-22 MED ORDER — ATORVASTATIN CALCIUM 20 MG PO TABS: ORAL_TABLET | ORAL | 0 refills | Status: DC

## 2021-12-29 ENCOUNTER — Other Ambulatory Visit: Payer: Self-pay | Admitting: Family Medicine

## 2022-01-02 DIAGNOSIS — D2330 Other benign neoplasm of skin of unspecified part of face: Secondary | ICD-10-CM

## 2022-01-02 DIAGNOSIS — C4491 Basal cell carcinoma of skin, unspecified: Secondary | ICD-10-CM

## 2022-01-02 HISTORY — DX: Basal cell carcinoma of skin, unspecified: C44.91

## 2022-01-02 HISTORY — DX: Other benign neoplasm of skin of unspecified part of face: D23.30

## 2022-01-15 ENCOUNTER — Ambulatory Visit: Admitting: Family Medicine

## 2022-01-15 ENCOUNTER — Encounter: Payer: Self-pay | Admitting: Family Medicine

## 2022-01-15 VITALS — BP 111/69 | HR 69 | Temp 98.2°F | Ht 65.0 in | Wt 238.6 lb

## 2022-01-15 DIAGNOSIS — R7303 Prediabetes: Secondary | ICD-10-CM | POA: Diagnosis not present

## 2022-01-15 DIAGNOSIS — E7800 Pure hypercholesterolemia, unspecified: Secondary | ICD-10-CM | POA: Diagnosis not present

## 2022-01-15 DIAGNOSIS — I10 Essential (primary) hypertension: Secondary | ICD-10-CM | POA: Insufficient documentation

## 2022-01-15 DIAGNOSIS — L989 Disorder of the skin and subcutaneous tissue, unspecified: Secondary | ICD-10-CM | POA: Diagnosis not present

## 2022-01-15 DIAGNOSIS — Z0000 Encounter for general adult medical examination without abnormal findings: Secondary | ICD-10-CM | POA: Diagnosis not present

## 2022-01-15 LAB — LIPID PANEL
Cholesterol: 131 mg/dL (ref 0–200)
HDL: 47.6 mg/dL (ref >39.00)
LDL Cholesterol: 71 mg/dL (ref 0–99)
NonHDL: 83.61
Total CHOL/HDL Ratio: 3
Triglycerides: 64 mg/dL (ref 0.0–149.0)
VLDL: 12.8 mg/dL (ref 0.0–40.0)

## 2022-01-15 LAB — HEMOGLOBIN A1C: Hgb A1c MFr Bld: 6.1 % (ref 4.6–6.5)

## 2022-01-15 MED ORDER — AMLODIPINE BESY-BENAZEPRIL HCL 10-20 MG PO CAPS: 1.0000 | ORAL_CAPSULE | Freq: Every day | ORAL | 1 refills | Status: DC

## 2022-01-15 MED ORDER — ATORVASTATIN CALCIUM 20 MG PO TABS: ORAL_TABLET | ORAL | 1 refills | Status: DC

## 2022-01-15 NOTE — Patient Instructions (Signed)
Health Maintenance, Male Adopting a healthy lifestyle and getting preventive care are important in promoting health and wellness. Ask your health care provider about: The right schedule for you to have regular tests and exams. Things you can do on your own to prevent diseases and keep yourself healthy. What should I know about diet, weight, and exercise? Eat a healthy diet  Eat a diet that includes plenty of vegetables, fruits, low-fat dairy products, and lean protein. Do not eat a lot of foods that are high in solid fats, added sugars, or sodium. Maintain a healthy weight Body mass index (BMI) is a measurement that can be used to identify possible weight problems. It estimates body fat based on height and weight. Your health care provider can help determine your BMI and help you achieve or maintain a healthy weight. Get regular exercise Get regular exercise. This is one of the most important things you can do for your health. Most adults should: Exercise for at least 150 minutes each week. The exercise should increase your heart rate and make you sweat (moderate-intensity exercise). Do strengthening exercises at least twice a week. This is in addition to the moderate-intensity exercise. Spend less time sitting. Even light physical activity can be beneficial. Watch cholesterol and blood lipids Have your blood tested for lipids and cholesterol at 65 years of age, then have this test every 5 years. You may need to have your cholesterol levels checked more often if: Your lipid or cholesterol levels are high. You are older than 65 years of age. You are at high risk for heart disease. What should I know about cancer screening? Many types of cancers can be detected early and may often be prevented. Depending on your health history and family history, you may need to have cancer screening at various ages. This may include screening for: Colorectal cancer. Prostate cancer. Skin cancer. Lung  cancer. What should I know about heart disease, diabetes, and high blood pressure? Blood pressure and heart disease High blood pressure causes heart disease and increases the risk of stroke. This is more likely to develop in people who have high blood pressure readings or are overweight. Talk with your health care provider about your target blood pressure readings. Have your blood pressure checked: Every 3-5 years if you are 18-39 years of age. Every year if you are 40 years old or older. If you are between the ages of 65 and 75 and are a current or former smoker, ask your health care provider if you should have a one-time screening for abdominal aortic aneurysm (AAA). Diabetes Have regular diabetes screenings. This checks your fasting blood sugar level. Have the screening done: Once every three years after age 45 if you are at a normal weight and have a low risk for diabetes. More often and at a younger age if you are overweight or have a high risk for diabetes. What should I know about preventing infection? Hepatitis B If you have a higher risk for hepatitis B, you should be screened for this virus. Talk with your health care provider to find out if you are at risk for hepatitis B infection. Hepatitis C Blood testing is recommended for: Everyone born from 1945 through 1965. Anyone with known risk factors for hepatitis C. Sexually transmitted infections (STIs) You should be screened each year for STIs, including gonorrhea and chlamydia, if: You are sexually active and are younger than 65 years of age. You are older than 65 years of age and your   health care provider tells you that you are at risk for this type of infection. Your sexual activity has changed since you were last screened, and you are at increased risk for chlamydia or gonorrhea. Ask your health care provider if you are at risk. Ask your health care provider about whether you are at high risk for HIV. Your health care provider  may recommend a prescription medicine to help prevent HIV infection. If you choose to take medicine to prevent HIV, you should first get tested for HIV. You should then be tested every 3 months for as long as you are taking the medicine. Follow these instructions at home: Alcohol use Do not drink alcohol if your health care provider tells you not to drink. If you drink alcohol: Limit how much you have to 0-2 drinks a day. Know how much alcohol is in your drink. In the [place] one drink equals one 12 oz bottle of beer (355 mL), one 5 oz glass of wine (148 mL), or one 1 oz glass of hard liquor (44 mL). Lifestyle Do not use any products that contain nicotine or tobacco. These products include cigarettes, chewing tobacco, and vaping devices, such as e-cigarettes. If you need help quitting, ask your health care provider. Do not use street drugs. Do not share needles. Ask your health care provider for help if you need support or information about quitting drugs. General instructions Schedule regular health, dental, and eye exams. Stay current with your vaccines. Tell your health care provider if: You often feel depressed. You have ever been abused or do not feel safe at home. Summary Adopting a healthy lifestyle and getting preventive care are important in promoting health and wellness. Follow your health care provider's instructions about healthy diet, exercising, and getting tested or screened for diseases. Follow your health care provider's instructions on monitoring your cholesterol and blood pressure. This information is not intended to replace advice given to you by your health care provider. Make sure you discuss any questions you have with your health care provider. Document Revised: 02/09/2021 Document Reviewed: 02/09/2021 Elsevier Patient Education  2023 [employer]  

## 2022-01-15 NOTE — Progress Notes (Signed)
Office Note ?01/15/2022 ? ?CC:  ?Chief Complaint  ?Patient presents with  ? Hypertension  ? Hyperlipidemia  ?  Pt is fasting  ? ? ?Patient is a 66 y.o. male who is here for annual health maintenance exam and 30-monthfollow-up hypertension and hyperlipidemia..[name]?A/P as of last visit: ?"1) BPH with LUT obst sx's: not responsive to medical tx-->referred to urology for further eval today. ? 2) CRI II/III: suspect medical renal dz secondary to chronic HTN, but given the presence of BPH with obstructive sx's and gradual slight increase in sCr the last year or so I will check renal ultrasound. ? ?INTERIM HX: ?He is doing well. ?Unfortunately he had another bout of acute diverticulitis on 12/12/2021.  All completely resolved now. ?Home blood pressures consistently 120s over 70s. ? ?The renal ultrasound he got after last visit showed a 6 mm nonobstructing right renal calculus but was otherwise normal. ? ?He has a few months history of a pigmented skin lesion on left forearm that is itchy and irritated at times.  Additionally, flesh-colored bump on the right cheek last several months as well. ? ?Past Medical History:  ?Diagnosis Date  ? Allergic rhinitis   ? Anxiety and depression   ? BPH (benign prostatic hyperplasia)   ? Nocturia is his primary symptom:  Takes Flomax  ? Diverticulitis large intestine 2015/16  ? Recurrent: GI MD Dr. MWatt Climes  Smoldering 'itis at most recent f/u CT in fall 2016--pt then had lap sig colectomy.  ? Eustachian tube dysfunction   ? with recurrent "sinus issues"--ENT (Dr. RConstance Holster recommended surgery for nasal turbinate reduction and septoplasty--as of 11/03/15)  ? GERD (gastroesophageal reflux disease)   ? Hepatic steatosis   ? History of adenomatous polyp of colon   ? History of kidney stones   ? Hyperlipidemia   ? Hypertension   ? Started med 03/2018  ? IBS (irritable bowel syndrome)   ? Inguinal pain, left   ? Chronic; Dr. TGrandville Silosto do MRI pelvis w/out contrast 08/2017 but insurance denied it.  ?  Nephrolithiasis 12/23/2014  ? 444mnonobstructive R renal calc on CT abd/pelv done for diverticulitis. 04/2021 6 mm nonobstructing stone R kidney, slight R renal pelviectasis, no hydronephrosis.  ? Obesity, Class II, BMI 35-39.9   ? OSA (obstructive sleep apnea)   ? Osteoarthritis of hips, bilateral 03/2018  ? moderate  ? Pre-diabetes   ? Skin cancer   ? Basal cell removed from left hand  ? ? ?Past Surgical History:  ?Procedure Laterality Date  ? COLONOSCOPY  10/17/2002;  07/26/13  ? Hx of polyps.  Recall 07/2017 per Dr. MaWatt Climes? COLONOSCOPY  08/24/2017  ? Diverticulosis from transverse colon to sigmoid.  Otherwise normal.  Repeat in 5-10 years for screening/Dr. MaWatt Climes? ESOPHAGOGASTRODUODENOSCOPY    ? need specifics from pt  ? HERNIA REPAIR    ? multiple.  Right inguinal repair by Dr. BuGeorganna Skeansn 2004.  07/2017 having some muscular/nerve pain in groin--evaluated by Dr. ThAlice Reichert/u at that time.  ? INCISIONAL HERNIA REPAIR N/A 04/22/2020  ? Procedure: HERNIA REPAIR INCISIONAL WITH MESH;  Surgeon: ThGeorganna SkeansMD;  [facility] Service: General;  Laterality: N/A;  ? LAPAROSCOPIC SIGMOID COLECTOMY N/A 09/15/2015  ? Procedure: LAPAROSCOPIC ASSISTED  SIGMOID COLECTOMY;  Surgeon: BuGeorganna SkeansMD;  [facility] Service: General;  Laterality: N/A;  ? SUPRA-UMBILICAL HERNIA N/A 1048/18/[id number]? Procedure: SUPRA-UMBILICAL HERNIA REPAIR WITH MESH;  Surgeon: ThGeorganna SkeansMD;  Location:  [facility];  Service: General;  Laterality: N/A;  ? TONSILLECTOMY    ? ? ?Family History  ?Problem Relation Age of Onset  ? GI problems Mother   ? Cancer Father   ?     skin  ? Hypertension Father   ? Glaucoma Father   ? Migraines Sister   ? Seizures Brother   ? Depression Brother   ? ? ?Social History  ? ?Socioeconomic History  ? Marital status: Married  ?  Spouse name: Not on file  ? Number of children: Not on file  ? Years of education: Not on file  ? Highest education level: Some college, no degree  ?Occupational History   ? Occupation: truck Geophysicist/field seismologist  ?  Employer: [employer]  ?Tobacco Use  ? Smoking status: Former  ?  Types: Cigars  ?  Quit date: 12/02/2009  ?  Years since quitting: 12.1  ? Smokeless tobacco: Former  ?  Types: Chew  ?  Quit date: 08/23/2013  ?Vaping Use  ? Vaping Use: Never used  ?Substance and Sexual Activity  ? Alcohol use: Not Currently  ?  Comment: 1-2 beers a week  ? Drug use: No  ? Sexual activity: Not on file  ?Other Topics Concern  ? Not on file  ?Social History Narrative  ? Married, step children.  ? Orig from [facility].  ? Occupation: delivery driver.  ? Former Facilities manager tob user, quit 2010.  ? Alcohol: 1 beer a day.  ? ?Social Determinants of Health  ? ?Financial Resource Strain: Low Risk   ? Difficulty of Paying Living Expenses: Not very hard  ?Food Insecurity: No Food Insecurity  ? Worried About Charity fundraiser in the Last Year: Never true  ? Ran Out of Food in the Last Year: Never true  ?Transportation Needs: No Transportation Needs  ? Lack of Transportation (Medical): No  ? Lack of Transportation (Non-Medical): No  ?Physical Activity: Insufficiently Active  ? Days of Exercise per Week: 3 days  ? Minutes of Exercise per Session: 20 min  ?Stress: Stress Concern Present  ? Feeling of Stress : To some extent  ?Social Connections: Moderately Integrated  ? Frequency of Communication with Friends and Family: Twice a week  ? Frequency of Social Gatherings with Friends and Family: Once a week  ? Attends Religious Services: 1 to 4 times per year  ? Active Member of Clubs or Organizations: No  ? Attends Archivist Meetings: Not on file  ? Marital Status: Married  ?Intimate Partner Violence: Not on file  ? ? ?Outpatient Medications Prior to Visit  ?Medication Sig Dispense Refill  ? acetaminophen (TYLENOL) 325 MG tablet Take 650 mg by mouth every 6 (six) hours as needed for moderate pain.    ? acetaminophen (TYLENOL) 650 MG CR tablet Take 650 mg by mouth daily.    ? amLODipine-benazepril  (LOTREL) 10-20 MG capsule Take 1 capsule by mouth daily. OFFICE VISIT NEEDED FOR FURTHER REFILLS 30 capsule 0  ? atorvastatin (LIPITOR) 20 MG tablet TAKE 1/2 TABLET(10 MG) BY MOUTH DAILY 45 tablet 0  ? cetirizine (ZYRTEC) 10 MG tablet Take 10 mg by mouth daily.    ? Lactobacillus (ACIDOPHILUS PROBIOTIC PO)     ? Lidocaine 4 % PTCH Apply 1 patch topically daily as needed (pain).    ? Melatonin 10 MG TABS Take 10 mg by mouth at bedtime.     ? Multiple Vitamins-Minerals (MULTIVITAMIN WITH MINERALS) tablet Take 1 tablet by  mouth daily.    ? Omega-3 Fatty Acids (FISH OIL) 1200 MG CAPS Take 1,200-2,400 mg by mouth See admin instructions. Take 2400 mg in the morning and 1200 mg in the evening    ? oxymetazoline (AFRIN) 0.05 % nasal spray Place 1 spray into both nostrils at bedtime as needed for congestion.    ? Psyllium (METAMUCIL PO)     ? tamsulosin (FLOMAX) 0.4 MG CAPS capsule Take 0.4 mg by mouth at bedtime.    ? pantoprazole (PROTONIX) 40 MG tablet TAKE 1 TABLET BY MOUTH EVERY DAY BEFORE BREAKFAST 90 tablet 0  ? ?No facility-administered medications prior to visit.  ? ? ?No Known Allergies ? ?Review of Systems  ?Constitutional:  Negative for appetite change, chills, fatigue and fever.  ?HENT:  Negative for congestion, dental problem, ear pain and sore throat.   ?Eyes:  Negative for discharge, redness and visual disturbance.  ?Respiratory:  Negative for cough, chest tightness, shortness of breath and wheezing.   ?Cardiovascular:  Negative for chest pain, palpitations and leg swelling.  ?Gastrointestinal:  Negative for abdominal pain, blood in stool, diarrhea, nausea and vomiting.  ?Genitourinary:  Negative for difficulty urinating, dysuria, flank pain, frequency, hematuria and urgency.  ?Musculoskeletal:  Negative for arthralgias, back pain, joint swelling, myalgias and neck stiffness.  ?Skin:  Negative for pallor and rash.  ?     L arm lesion and R cheek lesion as per hpi  ?Neurological:  Negative for dizziness,  speech difficulty, weakness and headaches.  ?Hematological:  Negative for adenopathy. Does not bruise/bleed easily.  ?Psychiatric/Behavioral:  Negative for confusion and sleep disturbance. The patient is no

## 2022-01-22 ENCOUNTER — Ambulatory Visit: Admitting: Family Medicine

## 2022-01-22 ENCOUNTER — Encounter: Payer: Self-pay | Admitting: Family Medicine

## 2022-01-22 VITALS — BP 110/60 | HR 68 | Ht 65.0 in | Wt 238.6 lb

## 2022-01-22 DIAGNOSIS — D2339 Other benign neoplasm of skin of other parts of face: Secondary | ICD-10-CM

## 2022-01-22 DIAGNOSIS — C44619 Basal cell carcinoma of skin of left upper limb, including shoulder: Secondary | ICD-10-CM

## 2022-01-22 DIAGNOSIS — L989 Disorder of the skin and subcutaneous tissue, unspecified: Secondary | ICD-10-CM

## 2022-01-22 NOTE — Progress Notes (Signed)
OFFICE VISIT ? ?01/22/2022 ? ?CC:  ?Chief Complaint  ?Patient presents with  ? Procedure  ?  Shave of skin lesion  ? ? ?Patient is a 66 y.o. male who presents for excision of skin lesions. ? ?HPI: ?Small papule on right side of nose, flesh-colored. ?Small papule left forearm, darkly pigmented. ? ? ?Past Medical History:  ?Diagnosis Date  ? Allergic rhinitis   ? Anxiety and depression   ? BPH (benign prostatic hyperplasia)   ? Nocturia is his primary symptom:  Takes Flomax  ? Diverticulitis large intestine 2015/16  ? Recurrent: GI MD [name].  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  ? Eustachian tube dysfunction   ? with recurrent "sinus issues"--ENT (Dr. [name]) recommended surgery for nasal turbinate reduction and septoplasty--as of 11/03/15)  ? GERD (gastroesophageal reflux disease)   ? Hepatic steatosis   ? History of adenomatous polyp of colon   ? History of kidney stones   ? Hyperlipidemia   ? Hypertension   ? Started med 03/2018  ? IBS (irritable bowel syndrome)   ? Inguinal pain, left   ? Chronic; [name] to do MRI pelvis w/out contrast 08/2017 but insurance denied it.  ? Nephrolithiasis 12/23/2014  ? 21m nonobstructive R renal calc on CT abd/pelv done for diverticulitis. 04/2021 6 mm nonobstructing stone R kidney, slight R renal pelviectasis, no hydronephrosis.  ? Obesity, Class II, BMI 35-39.9   ? OSA (obstructive sleep apnea)   ? Osteoarthritis of hips, bilateral 03/2018  ? moderate  ? Pre-diabetes   ? Skin cancer   ? Basal cell removed from left hand  ? ? ?Past Surgical History:  ?Procedure Laterality Date  ? COLONOSCOPY  10/17/2002;  07/26/13  ? Hx of polyps.  Recall 07/2017 per Dr. MWatt Climes ? COLONOSCOPY  08/24/2017  ? Diverticulosis from transverse colon to sigmoid.  Otherwise normal.  Repeat in 5-10 years for screening/Dr. MWatt Climes ? ESOPHAGOGASTRODUODENOSCOPY    ? need specifics from pt  ? HERNIA REPAIR    ? multiple.  Right inguinal repair by Dr. BGeorganna Skeansin 2004.  07/2017 having some muscular/nerve pain in groin--evaluated by Dr. TAlice Reichertw/u at that time.  ? INCISIONAL HERNIA REPAIR N/A 04/22/2020  ? Procedure: HERNIA REPAIR INCISIONAL WITH MESH;  Surgeon: TGeorganna Skeans MD;  [facility]  Service: General;  Laterality: N/A;  ? LAPAROSCOPIC SIGMOID COLECTOMY N/A 09/15/2015  ? Procedure: LAPAROSCOPIC ASSISTED  SIGMOID COLECTOMY;  Surgeon: BGeorganna Skeans MD;  [facility]  Service: General;  Laterality: N/A;  ? SUPRA-UMBILICAL HERNIA N/A 152/84/1324 ? Procedure: SUPRA-UMBILICAL HERNIA REPAIR WITH MESH;  Surgeon: TGeorganna Skeans MD;  [facility]  Service: General;  Laterality: N/A;  ? TONSILLECTOMY    ? ? ?Outpatient Medications Prior to Visit  ?Medication Sig Dispense Refill  ? acetaminophen (TYLENOL) 650 MG CR tablet Take 650 mg by mouth daily.    ? amLODipine-benazepril (LOTREL) 10-20 MG capsule Take 1 capsule by mouth daily. 90 capsule 1  ? atorvastatin (LIPITOR) 20 MG tablet TAKE 1/2 TABLET(10 MG) BY MOUTH DAILY 45 tablet 1  ? cetirizine (ZYRTEC) 10 MG tablet Take 10 mg by mouth daily.    ? Lactobacillus (ACIDOPHILUS PROBIOTIC PO)     ? Lidocaine 4 % PTCH Apply 1 patch topically daily as needed (pain).    ? Melatonin 10 MG TABS Take 10 mg by mouth at bedtime.     ? Multiple Vitamins-Minerals (  MULTIVITAMIN WITH MINERALS) tablet Take 1 tablet by mouth daily.    ? Omega-3 Fatty Acids (FISH OIL) 1200 MG CAPS Take 1,200-2,400 mg by mouth See admin instructions. Take 2400 mg in the morning and 1200 mg in the evening    ? oxymetazoline (AFRIN) 0.05 % nasal spray Place 1 spray into both nostrils at bedtime as needed for congestion.    ? Psyllium (METAMUCIL PO)     ? tamsulosin (FLOMAX) 0.4 MG CAPS capsule Take 0.4 mg by mouth at bedtime.    ? acetaminophen (TYLENOL) 325 MG tablet Take 650 mg by mouth every 6 (six) hours as needed for moderate pain.    ? ?No facility-administered medications prior to visit.  ? ? ?No Known Allergies ? ?ROS ?As  per HPI ? ?PE: ? ?  01/22/2022  ? 10:42 AM 01/15/2022  ?  8:47 AM 12/12/2021  ?  8:12 AM  ?Vitals with BMI  ?Height '5\' 5"'$  '5\' 5"'$    ?Weight 238 lbs 10 oz 238 lbs 10 oz   ?BMI 39.71 39.71   ?Systolic 585 277 824  ?Diastolic 60 69 73  ?Pulse 68 69 76  ? ?Physical Exam ? ?Right side of nose with flesh colored papule 54m ?Left forearm--3 mm dark pigmented papule ? ?LABS:  ?Last metabolic panel ?Lab Results  ?Component Value Date  ? GLUCOSE 116 (H) 12/12/2021  ? NA 139 12/12/2021  ? K 4.3 12/12/2021  ? CL 103 12/12/2021  ? CO2 27 12/12/2021  ? BUN 15 12/12/2021  ? CREATININE 1.12 12/12/2021  ? GFRNONAA >60 12/12/2021  ? CALCIUM 9.5 12/12/2021  ? PROT 7.7 12/12/2021  ? ALBUMIN 4.3 12/12/2021  ? BILITOT 0.8 12/12/2021  ? ALKPHOS 49 12/12/2021  ? AST 22 12/12/2021  ? ALT 31 12/12/2021  ? ANIONGAP 9 12/12/2021  ? ?IMPRESSION AND PLAN: ? ?#1 skin lesion right side of nose and left forearm. ?Patient elects for excision and pathology assessment. ? ?Procedure: skin lesion removal--papule R side of nose (321m.  ?Consent obtained.  Area prepped after being infiltrated with 1/2 cc 1% lidocaine w/out epi.  Sterile technique utilized using dermablade to shave the excision at its base.  Minimal bleeding. No immediate complications.  Pt tolerated procedure well. ?Specimen sent to pathology.  Wound care instructions discussed. ? ?Procedure: skin lesion removal--left forearm papule (17m66m?Consent obtained.  Area prepped  after being infiltrated with 1 cc 1% lidocaine w/out epi.  Sterile technique utilized with dermablade to excise lesion at its base.  Minimal bleeding.  No immediate complications.  Pt tolerated procedure well. ?Specimen sent to pathology.  Wound care instructions discussed. ? ?An After Visit Summary was printed and given to the patient. ? ?FOLLOW UP: Return for as needed. ? ?Signed:  PhiCrissie SicklesD           01/22/2022 ? ? ? ?

## 2022-01-22 NOTE — Addendum Note (Signed)
Addended by: [name] on: 01/22/2022 11:25 AM ? ? Modules accepted: Orders ? ?

## 2022-01-29 ENCOUNTER — Telehealth: Payer: Self-pay

## 2022-01-29 ENCOUNTER — Encounter: Payer: Self-pay | Admitting: Family Medicine

## 2022-01-29 DIAGNOSIS — D239 Other benign neoplasm of skin, unspecified: Secondary | ICD-10-CM

## 2022-01-29 DIAGNOSIS — C44619 Basal cell carcinoma of skin of left upper limb, including shoulder: Secondary | ICD-10-CM

## 2022-01-29 NOTE — Telephone Encounter (Signed)
-----   Message from [name], MD sent at 01/29/2022  8:14 AM EDT ----- ?Please notify patient that the skin lesion on the left forearm showed basal cell cancer.  This is not a dangerous kind of cancer but does need to be seen by the dermatologist to get further skin surgery in the area where I removed it. ?His lesion on the nose was not cancer but does need to be further excised completely as well. ?Please refer to dermatology, diagnosis basal cell carcinoma left arm as well as sebaceous adenoma.  Thanks ?

## 2022-02-15 ENCOUNTER — Other Ambulatory Visit

## 2022-02-15 ENCOUNTER — Other Ambulatory Visit: Payer: Self-pay | Admitting: Gastroenterology

## 2022-02-15 DIAGNOSIS — Z8719 Personal history of other diseases of the digestive system: Secondary | ICD-10-CM

## 2022-02-15 DIAGNOSIS — R1032 Left lower quadrant pain: Secondary | ICD-10-CM

## 2022-02-18 ENCOUNTER — Encounter (HOSPITAL_BASED_OUTPATIENT_CLINIC_OR_DEPARTMENT_OTHER): Payer: Self-pay | Admitting: Emergency Medicine

## 2022-02-18 ENCOUNTER — Emergency Department (HOSPITAL_BASED_OUTPATIENT_CLINIC_OR_DEPARTMENT_OTHER)

## 2022-02-18 ENCOUNTER — Emergency Department (HOSPITAL_BASED_OUTPATIENT_CLINIC_OR_DEPARTMENT_OTHER)
Admission: EM | Admit: 2022-02-18 | Discharge: 2022-02-18 | Disposition: A | Discharge status: Home / Self Care | Attending: Emergency Medicine | Admitting: Emergency Medicine

## 2022-02-18 ENCOUNTER — Other Ambulatory Visit: Payer: Self-pay

## 2022-02-18 DIAGNOSIS — Z87891 Personal history of nicotine dependence: Secondary | ICD-10-CM | POA: Diagnosis not present

## 2022-02-18 DIAGNOSIS — Z85820 Personal history of malignant melanoma of skin: Secondary | ICD-10-CM | POA: Insufficient documentation

## 2022-02-18 DIAGNOSIS — K5732 Diverticulitis of large intestine without perforation or abscess without bleeding: Secondary | ICD-10-CM | POA: Diagnosis not present

## 2022-02-18 DIAGNOSIS — I10 Essential (primary) hypertension: Secondary | ICD-10-CM | POA: Insufficient documentation

## 2022-02-18 DIAGNOSIS — Z79899 Other long term (current) drug therapy: Secondary | ICD-10-CM | POA: Insufficient documentation

## 2022-02-18 DIAGNOSIS — R1032 Left lower quadrant pain: Secondary | ICD-10-CM | POA: Diagnosis present

## 2022-02-18 LAB — BASIC METABOLIC PANEL
Anion gap: 11 (ref 5–15)
BUN: 16 mg/dL (ref 8–23)
CO2: 24 mmol/L (ref 22–32)
Calcium: 9.4 mg/dL (ref 8.9–10.3)
Chloride: 104 mmol/L (ref 98–111)
Creatinine, Ser: 0.98 mg/dL (ref 0.61–1.24)
GFR, Estimated: >60 mL/min (ref >60)
Glucose, Bld: 109 mg/dL — ABNORMAL HIGH (ref 70–99)
Potassium: 4.2 mmol/L (ref 3.5–5.1)
Sodium: 139 mmol/L (ref 135–145)

## 2022-02-18 LAB — CBC WITH DIFFERENTIAL/PLATELET
Abs Immature Granulocytes: 0.03 10*3/uL (ref 0.00–0.07)
Basophils Absolute: 0 10*3/uL (ref 0.0–0.1)
Basophils Relative: 0 %
Eosinophils Absolute: 0.2 10*3/uL (ref 0.0–0.5)
Eosinophils Relative: 2 %
HCT: 43.1 % (ref 39.0–52.0)
Hemoglobin: 14.4 g/dL (ref 13.0–17.0)
Immature Granulocytes: 0 %
Lymphocytes Relative: 17 %
Lymphs Abs: 2.1 10*3/uL (ref 0.7–4.0)
MCH: 29.6 pg (ref 26.0–34.0)
MCHC: 33.4 g/dL (ref 30.0–36.0)
MCV: 88.7 fL (ref 80.0–100.0)
Monocytes Absolute: 1.6 10*3/uL — ABNORMAL HIGH (ref 0.1–1.0)
Monocytes Relative: 13 %
Neutro Abs: 8.4 10*3/uL — ABNORMAL HIGH (ref 1.7–7.7)
Neutrophils Relative %: 68 %
Platelets: 220 10*3/uL (ref 150–400)
RBC: 4.86 MIL/uL (ref 4.22–5.81)
RDW: 13.1 % (ref 11.5–15.5)
WBC: 12.4 10*3/uL — ABNORMAL HIGH (ref 4.0–10.5)
nRBC: 0 % (ref 0.0–0.2)

## 2022-02-18 MED ORDER — AMOXICILLIN-POT CLAVULANATE 875-125 MG PO TABS
1.0000 | ORAL_TABLET | Freq: Two times a day (BID) | ORAL | 0 refills | Status: DC
Start: 2022-02-18 — End: 2022-07-19

## 2022-02-18 MED ORDER — IOHEXOL 300 MG/ML  SOLN
100.0000 mL | Freq: Once | INTRAMUSCULAR | Status: AC | PRN
  Administered 2022-02-18: 100 mL via INTRAVENOUS

## 2022-02-18 MED ORDER — AMOXICILLIN-POT CLAVULANATE 875-125 MG PO TABS
1.0000 | ORAL_TABLET | Freq: Once | ORAL | Status: AC
  Administered 2022-02-18: 1 via ORAL
  Filled 2022-02-18: qty 1

## 2022-02-18 NOTE — ED Provider Notes (Signed)
[facility] Provider Note: Georgena Spurling, MD, FACEP  CSN: [id number] MRN: [id number] ARRIVAL: 02/18/22 at [place]: [place]  Abdominal Pain   HISTORY OF PRESENT ILLNESS  02/18/22 5:17 AM Kevin Young is a 66 y.o. male with a history of multiple abdominal surgeries as well as diverticulitis.  He is here with 2 days of left lower quadrant pain.  He rates the pain as an 8 out of 10, worse with movement or palpation.  He has been constipated for about 3 to 4 days, unable to pass any significant amount of stool despite taking laxatives.  He is not nauseated or vomiting but did have decreased oral intake yesterday.   Past Medical History:  Diagnosis Date   Allergic rhinitis    Anxiety and depression    Basal cell carcinoma 01/2022   Left arm--Derm referral   BPH (benign prostatic hyperplasia)    Nocturia is his primary symptom:  Takes Flomax   Diverticulitis large intestine 2015/16   Recurrent: GI MD [name].  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   Eustachian tube dysfunction    with recurrent "sinus issues"--ENT (Dr. [name]) recommended surgery for nasal turbinate reduction and septoplasty--as of 11/03/15)   GERD (gastroesophageal reflux disease)    Hepatic steatosis    History of adenomatous polyp of colon    History of kidney stones    Hyperlipidemia    Hypertension    Started med 03/2018   IBS (irritable bowel syndrome)    Inguinal pain, left    Chronic; [name] to do MRI pelvis w/out contrast 08/2017 but insurance denied it.   Nephrolithiasis 12/23/2014   71m nonobstructive R renal calc on CT abd/pelv done for diverticulitis. 04/2021 6 mm nonobstructing stone R kidney, slight R renal pelviectasis, no hydronephrosis.   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  Recall 07/2017 per Dr. MWatt Climes  COLONOSCOPY  08/24/2017   Diverticulosis from transverse colon to sigmoid.  Otherwise normal.  Repeat in 5-10 years for screening/Dr. MWatt Climes  ESOPHAGOGASTRODUODENOSCOPY     need specifics from pt   HERNIA REPAIR     multiple.  Right inguinal repair by Dr. BGeorganna Skeansin 2004.  07/2017 having some muscular/nerve pain in groin--evaluated by Dr. TAlice Reichertw/u at that time.   INCISIONAL HERNIA REPAIR N/A 04/22/2020   Procedure: HERNIA REPAIR INCISIONAL WITH MESH;  Surgeon: TGeorganna Skeans MD;  [facility]  Service: General;  Laterality: N/A;   LAPAROSCOPIC SIGMOID COLECTOMY N/A 09/15/2015   Procedure: LAPAROSCOPIC ASSISTED  SIGMOID COLECTOMY;  Surgeon: BGeorganna Skeans MD;  Location: [facility]  Service: General;  Laterality: N/A;   SUPRA-UMBILICAL HERNIA N/A 140/07/2724  Procedure: SUPRA-UMBILICAL HERNIA REPAIR WITH MESH;  Surgeon: TGeorganna Skeans MD;  Location: [facility]  Service: General;  Laterality: N/A;   TONSILLECTOMY      Family History  Problem Relation Age of Onset   GI problems Mother    Cancer Father        skin   Hypertension Father    Glaucoma Father  Migraines Sister    Seizures Brother    Depression Brother     Social History   Tobacco Use   Smoking status: Former    Types: Cigars    Quit date: 12/02/2009    Years since quitting: 12.2   Smokeless tobacco: Former    Types: Chew    Quit date: 08/23/2013  Vaping Use   Vaping Use: Never used  Substance Use Topics   Alcohol use: Not Currently    Comment: 1-2 beers a week   Drug use: No    Prior to Admission medications   Medication Sig Start Date End Date Taking? Authorizing Provider  acetaminophen (TYLENOL) 650 MG CR tablet Take 650 mg by mouth daily. 10/04/21   [provider]   amLODipine-benazepril (LOTREL) 10-20 MG capsule Take 1 capsule by mouth daily. 01/15/22   McGowen, Adrian Blackwater, MD  atorvastatin (LIPITOR) 20 MG tablet TAKE 1/2 TABLET(10 MG) BY MOUTH DAILY 01/15/22   McGowen, Adrian Blackwater, MD  cetirizine (ZYRTEC) 10 MG tablet Take 10 mg by mouth daily.    [provider]  Lactobacillus (ACIDOPHILUS PROBIOTIC PO)  12/04/21   [provider]  Lidocaine 4 % PTCH Apply 1 patch topically daily as needed (pain).    [provider]  Melatonin 10 MG TABS Take 10 mg by mouth at bedtime.     [provider]  Multiple Vitamins-Minerals (MULTIVITAMIN WITH MINERALS) tablet Take 1 tablet by mouth daily.    [provider]  Omega-3 Fatty Acids (FISH OIL) 1200 MG CAPS Take 1,200-2,400 mg by mouth See admin instructions. Take 2400 mg in the morning and 1200 mg in the evening    [provider]  oxymetazoline (AFRIN) 0.05 % nasal spray Place 1 spray into both nostrils at bedtime as needed for congestion.    [provider]  Psyllium (METAMUCIL PO)  01/02/22   [provider]  tamsulosin (FLOMAX) 0.4 MG CAPS capsule Take 0.4 mg by mouth at bedtime.    [provider]    Allergies Patient has no known allergies.   REVIEW OF SYSTEMS  Negative except as noted here or in the History of Present Illness.   PHYSICAL EXAMINATION  Initial Vital Signs Blood pressure (!) 150/88, pulse 79, temperature 98.3 F (36.8 C), resp. rate 18, height '5\' 5"'$  (1.651 m), weight 107 kg, SpO2 94 %.  Examination General: Well-developed, well-nourished male in no acute distress; appearance consistent with age of record HENT: normocephalic; atraumatic Eyes: pupils equal, round and reactive to light; extraocular muscles intact Neck: supple Heart: regular rate and rhythm Lungs: clear to auscultation bilaterally Abdomen: soft; nondistended; left lower lateral tenderness; bowel sounds present Extremities: No deformity; full range of  motion; pulses normal Neurologic: Awake, alert and oriented; motor function intact in all extremities and symmetric; no facial droop Skin: Warm and dry Psychiatric: Normal mood and affect   RESULTS  Summary of this visit's results, reviewed and interpreted by myself:   EKG Interpretation  Date/Time:    Ventricular Rate:    PR Interval:    QRS Duration:   QT Interval:    QTC Calculation:   R Axis:     Text Interpretation:         Laboratory Studies: Results for orders placed or performed during the hospital encounter of 02/18/22 (from the past 24 hour(s))  CBC with Differential     Status: Abnormal   Collection Time: 02/18/22  5:18 AM  Result Value Ref Range  WBC 12.4 (H) 4.0 - 10.5 K/uL   RBC 4.86 4.22 - 5.81 MIL/uL   Hemoglobin 14.4 13.0 - 17.0 g/dL   HCT 43.1 39.0 - 52.0 %   MCV 88.7 80.0 - 100.0 fL   MCH 29.6 26.0 - 34.0 pg   MCHC 33.4 30.0 - 36.0 g/dL   RDW 13.1 11.5 - 15.5 %   Platelets 220 150 - 400 K/uL   nRBC 0.0 0.0 - 0.2 %   Neutrophils Relative % 68 %   Neutro Abs 8.4 (H) 1.7 - 7.7 K/uL   Lymphocytes Relative 17 %   Lymphs Abs 2.1 0.7 - 4.0 K/uL   Monocytes Relative 13 %   Monocytes Absolute 1.6 (H) 0.1 - 1.0 K/uL   Eosinophils Relative 2 %   Eosinophils Absolute 0.2 0.0 - 0.5 K/uL   Basophils Relative 0 %   Basophils Absolute 0.0 0.0 - 0.1 K/uL   Immature Granulocytes 0 %   Abs Immature Granulocytes 0.03 0.00 - 0.07 K/uL  Basic metabolic panel     Status: Abnormal   Collection Time: 02/18/22  5:18 AM  Result Value Ref Range   Sodium 139 135 - 145 mmol/L   Potassium 4.2 3.5 - 5.1 mmol/L   Chloride 104 98 - 111 mmol/L   CO2 24 22 - 32 mmol/L   Glucose, Bld 109 (H) 70 - 99 mg/dL   BUN 16 8 - 23 mg/dL   Creatinine, Ser 0.98 0.61 - 1.24 mg/dL   Calcium 9.4 8.9 - 10.3 mg/dL   GFR, Estimated >60 >60 mL/min   Anion gap 11 5 - 15   Imaging Studies: CT ABDOMEN PELVIS W CONTRAST  Result Date: 02/18/2022 CLINICAL DATA:  Left lower quadrant pain.  History of diverticulitis. EXAM: CT ABDOMEN AND PELVIS WITH CONTRAST TECHNIQUE: Multidetector CT imaging of the abdomen and pelvis was performed using the standard protocol following bolus administration of intravenous contrast. RADIATION DOSE REDUCTION: This exam was performed according to the departmental dose-optimization program which includes automated exposure control, adjustment of the mA and/or kV according to patient size and/or use of iterative reconstruction technique. CONTRAST:  19m OMNIPAQUE IOHEXOL 300 MG/ML  SOLN COMPARISON:  CTs with IV contrast 12/12/2021 and 05/12/2015. FINDINGS: Lower chest: There are scattered linear scarring or atelectasis in the lung bases without infiltrates. The cardiac size is normal. Hepatobiliary: The liver is 15 cm length mildly steatotic. A small cyst is again noted in segment 4A but there is no mass enhancement. The gallbladder and bile ducts are unremarkable. Pancreas: No focal abnormality. Spleen: No focal abnormality or splenomegaly. Adrenals/Urinary Tract: There is no adrenal mass. There are small bilateral renal cysts including parapelvic cysts. There are several bilateral additional too small to characterize cortical hypodensities which are unchanged and probably also cysts. There is a 4 mm nonobstructive caliceal stone in the inferior pole right kidney. No other stone is seen. There is no ureteral stone or hydronephrosis. The bladder is contracted and not well seen but previously has been unremarkable. Stomach/Bowel: Unremarkable contracted stomach, unopacified small bowel. The appendix is normal and well visible. There are diffuse colonic diverticula. In approximally the mid third of the descending colon there is a 9 cm segment with interval increased wall thickening and surrounding inflammatory stranding consistent with acute diverticulitis. This overlaps but is not entirely the same segment that was inflamed on the last CT. Surgical anastomoses are again  noted in the distal descending and proximal sigmoid colon with no other focal inflammatory changes  being seen. Vascular/Lymphatic: Aortic atherosclerosis. No enlarged abdominal or pelvic lymph nodes. Reproductive: No prostatomegaly. Other: There is trace nonlocalizing reactive fluid in the left paracolic gutter associated with diverticulitis but there is no localizing collection, free air, free hemorrhage or abscess. There is supraumbilical rectus diastasis and fat hernia similar to the previous exam and evidence of prior attempted repair. There is a subcutaneous lentiform shaped rim enhancing collection eccentric to the left of midline in the low anterior wall today measuring 4.8 x 1.6 cm, previously 4.8 x 2.3 cm, and otherwise unchanged. Musculoskeletal: A bone island is again noted in the left hemisacrum. This is unchanged. There are degenerative changes of the thoracic and lumbar spine. No destructive bone lesion is seen. There is a small right inguinal fat hernia. IMPRESSION: 1. Acute diverticulitis in approximately the mid third of the descending colon which overlaps but does not completely include the area of prior diverticulitis on the last CT. There is minimal associated nonlocalizing reactive fluid posterior to the diseased segment but there is no free air or abscess. Colonoscopy follow-up suggested after treatment. 2. Other diffuse diverticula are uncomplicated. Evidence of prior surgical anastomosis in the distal descending and proximal sigmoid colon. 3. Rim enhancing lentiform collection in the subcutaneous plane in the infraumbilical abdominal wall is slightly smaller than previously probably a seroma or hematoma, less likely infectious process. 4. Renal cysts and additional too small to characterize hypodensities. Nonobstructive nephrolithiasis. 5. Supraumbilical rectus diastasis and fat hernia. 6. Aortic atherosclerosis and remaining findings discussed above. Electronically Signed   By: Telford Nab M.D.   On: 02/18/2022 06:52    ED COURSE and MDM  Nursing notes, initial and subsequent vitals signs, including pulse oximetry, reviewed and interpreted by myself.  Vitals:   02/18/22 0514 02/18/22 0545 02/18/22 0600  BP: (!) 150/88 118/72 101/63  Pulse: 79 80 78  Resp: '18 18 18  '$ Temp: 98.3 F (36.8 C)    SpO2: 94% 96% 94%  Weight: 107 kg    Height: '5\' 5"'$  (1.651 m)     Medications  amoxicillin-clavulanate (AUGMENTIN) 875-125 MG per tablet 1 tablet (has no administration in time range)  iohexol (OMNIPAQUE) 300 MG/ML solution 100 mL (100 mLs Intravenous Contrast Given 02/18/22 0628)   6:36 AM CT scan reviewed by myself.  There are changes in the sigmoid colon consistent with acute diverticulitis.  I do not appreciate an abscess or perforation.  6:55 AM The radiologist formally reading the CT scan agrees with my assessment of the diverticulitis.  We will treat with Augmentin.  The patient is already being followed for this by his gastroenterologist [name].  The lentiform collection infraumbilically has been assessed by Dr. [name] of general surgery.  [name] has told the patient it is a minor complication of a hernia repair he performed last year and recommends it be left alone and observed.  The patient is already on a probiotic.  PROCEDURES  Procedures   ED DIAGNOSES     ICD-10-CM   1. Diverticulitis large intestine w/o perforation or abscess w/o bleeding  K57.32          Kourtlyn Charlet, Jenny Reichmann, MD 02/18/22 [phone]

## 2022-02-18 NOTE — ED Notes (Signed)
Pt verbalizes understanding of discharge instructions. Opportunity for questioning and answers were provided. Pt discharged from ED to home.   ? ?

## 2022-02-18 NOTE — ED Triage Notes (Signed)
Pt c/o LLQ pain x 2 days. Pt has hx of diverticulitis.

## 2022-03-09 ENCOUNTER — Ambulatory Visit
Admission: RE | Admit: 2022-03-09 | Discharge: 2022-03-09 | Disposition: A | Discharge status: Home / Self Care | Source: Ambulatory Visit | Attending: Gastroenterology | Admitting: Gastroenterology

## 2022-03-09 DIAGNOSIS — Z8719 Personal history of other diseases of the digestive system: Secondary | ICD-10-CM

## 2022-03-09 DIAGNOSIS — R1032 Left lower quadrant pain: Secondary | ICD-10-CM

## 2022-03-09 MED ORDER — IOPAMIDOL (ISOVUE-300) INJECTION 61%
100.0000 mL | Freq: Once | INTRAVENOUS | Status: AC | PRN
  Administered 2022-03-09: 100 mL via INTRAVENOUS

## 2022-04-27 ENCOUNTER — Other Ambulatory Visit: Payer: Self-pay

## 2022-06-09 DIAGNOSIS — Z09 Encounter for follow-up examination after completed treatment for conditions other than malignant neoplasm: Secondary | ICD-10-CM | POA: Diagnosis not present

## 2022-06-09 DIAGNOSIS — Z86010 Personal history of colonic polyps: Secondary | ICD-10-CM | POA: Diagnosis not present

## 2022-06-09 DIAGNOSIS — K621 Rectal polyp: Secondary | ICD-10-CM | POA: Diagnosis not present

## 2022-06-09 DIAGNOSIS — K649 Unspecified hemorrhoids: Secondary | ICD-10-CM | POA: Diagnosis not present

## 2022-06-09 DIAGNOSIS — K5730 Diverticulosis of large intestine without perforation or abscess without bleeding: Secondary | ICD-10-CM | POA: Diagnosis not present

## 2022-06-09 DIAGNOSIS — Z980 Intestinal bypass and anastomosis status: Secondary | ICD-10-CM | POA: Diagnosis not present

## 2022-06-11 DIAGNOSIS — K621 Rectal polyp: Secondary | ICD-10-CM | POA: Diagnosis not present

## 2022-07-14 ENCOUNTER — Other Ambulatory Visit: Payer: Self-pay | Admitting: Family Medicine

## 2022-07-19 ENCOUNTER — Encounter: Payer: Self-pay | Admitting: Family Medicine

## 2022-07-19 ENCOUNTER — Ambulatory Visit (INDEPENDENT_AMBULATORY_CARE_PROVIDER_SITE_OTHER): Payer: Medicare Other | Admitting: Family Medicine

## 2022-07-19 VITALS — BP 107/69 | HR 67 | Temp 98.2°F | Ht 65.0 in | Wt 236.6 lb

## 2022-07-19 DIAGNOSIS — I10 Essential (primary) hypertension: Secondary | ICD-10-CM | POA: Diagnosis not present

## 2022-07-19 DIAGNOSIS — E7800 Pure hypercholesterolemia, unspecified: Secondary | ICD-10-CM

## 2022-07-19 DIAGNOSIS — R7303 Prediabetes: Secondary | ICD-10-CM | POA: Diagnosis not present

## 2022-07-19 LAB — LIPID PANEL
Cholesterol: 151 mg/dL (ref 0–200)
HDL: 64 mg/dL (ref >39.00)
LDL Cholesterol: 70 mg/dL (ref 0–99)
NonHDL: 87.18
Total CHOL/HDL Ratio: 2
Triglycerides: 84 mg/dL (ref 0.0–149.0)
VLDL: 16.8 mg/dL (ref 0.0–40.0)

## 2022-07-19 LAB — COMPREHENSIVE METABOLIC PANEL
ALT: 39 U/L (ref 0–53)
AST: 29 U/L (ref 0–37)
Albumin: 4.4 g/dL (ref 3.5–5.2)
Alkaline Phosphatase: 62 U/L (ref 39–117)
BUN: 17 mg/dL (ref 6–23)
CO2: 27 mEq/L (ref 19–32)
Calcium: 9.5 mg/dL (ref 8.4–10.5)
Chloride: 102 mEq/L (ref 96–112)
Creatinine, Ser: 1.17 mg/dL (ref 0.40–1.50)
GFR: 65.56 mL/min (ref >60.00)
Glucose, Bld: 103 mg/dL — ABNORMAL HIGH (ref 70–99)
Potassium: 4.7 mEq/L (ref 3.5–5.1)
Sodium: 140 mEq/L (ref 135–145)
Total Bilirubin: 0.8 mg/dL (ref 0.2–1.2)
Total Protein: 7 g/dL (ref 6.0–8.3)

## 2022-07-19 LAB — POCT GLYCOSYLATED HEMOGLOBIN (HGB A1C)
HbA1c POC (<> result, manual entry): 5.6 % (ref 4.0–5.6)
HbA1c, POC (controlled diabetic range): 5.6 % (ref 0.0–7.0)
HbA1c, POC (prediabetic range): 5.6 % — AB (ref 5.7–6.4)
Hemoglobin A1C: 5.6 % (ref 4.0–5.6)

## 2022-07-19 MED ORDER — AMLODIPINE BESY-BENAZEPRIL HCL 10-20 MG PO CAPS: 1.0000 | ORAL_CAPSULE | Freq: Every day | ORAL | 1 refills | Status: DC

## 2022-07-19 MED ORDER — ATORVASTATIN CALCIUM 20 MG PO TABS: ORAL_TABLET | ORAL | 1 refills | Status: DC

## 2022-07-19 NOTE — Progress Notes (Signed)
OFFICE VISIT  07/19/2022  CC:  Chief Complaint  Patient presents with   Hypertension   Hyperlipidemia   Prediabtes   Patient is a 66 y.o. male who presents for 38-monthfollow-up hypertension, prediabetes, and hyperlipidemia.  INTERIM HX: Kevin Young to be doing fine. He did have an episode of diverticulitis in May and recovered without problem.  No home blood pressure monitoring. Still taking care of his chronically ill wife.  She is progressively declining.  He is dealing with the heavy weight of having to make decisions for her, including end-of-life issues. He walks 3 days a week while she is at dialysis. Unfortunately, his diet does consist of a moderate amount of processed foods.  ROS as above, plus--> no fevers, no CP, no SOB, no wheezing, no cough, no dizziness, no HAs, no rashes, no melena/hematochezia.  No polyuria or polydipsia.  No myalgias or arthralgias.  No focal weakness, paresthesias, or tremors.  No acute vision or hearing abnormalities.  No dysuria or unusual/new urinary urgency or frequency.  Slight increased swelling in both legs No n/v/d or abd pain.  No palpitations.    Past Medical History:  Diagnosis Date   Allergic rhinitis    Anxiety and depression    Basal cell carcinoma 01/2022   Left arm--Derm referral   BPH (benign prostatic hyperplasia)    Nocturia is his primary symptom:  Takes Flomax   Diverticulitis large intestine 2015/16   Recurrent: GI MD Dr. MWatt Climes  Smoldering 'itis at most recent f/u CT in fall 2016--pt then had lap sig colectomy.   Eustachian tube dysfunction    with recurrent "sinus issues"--ENT (Dr. RConstance Holster recommended surgery for nasal turbinate reduction and septoplasty--as of 11/03/15)   GERD (gastroesophageal reflux disease)    Hepatic steatosis    History of adenomatous polyp of colon    History of kidney stones    Hyperlipidemia    Hypertension    Started med 03/2018   IBS (irritable bowel syndrome)    Inguinal pain, left     Chronic; Dr. TGrandville Silosto do MRI pelvis w/out contrast 08/2017 but insurance denied it.   Nephrolithiasis 12/23/2014   456mnonobstructive R renal calc on CT abd/pelv done for diverticulitis. 04/2021 6 mm nonobstructing stone R kidney, slight R renal pelviectasis, no hydronephrosis.   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  Recall 07/2017 per Dr. MaWatt Climes COLONOSCOPY  08/24/2017   Diverticulosis from transverse colon to sigmoid.  Otherwise normal.  Repeat in 5-10 years for screening/Dr. MaWatt Climes ESOPHAGOGASTRODUODENOSCOPY     need specifics from pt   HERNIA REPAIR     multiple.  Right inguinal repair by Dr. BuGeorganna Skeansn 2004.  07/2017 having some muscular/nerve pain in groin--evaluated by Dr. ThAlice Reichert/u at that time.   INCISIONAL HERNIA REPAIR N/A 04/22/2020   Procedure: HERNIA REPAIR INCISIONAL WITH MESH;  Surgeon: ThGeorganna SkeansMD;  [facility] [facility] Service: General;  Laterality: N/A;   LAPAROSCOPIC SIGMOID COLECTOMY N/A 09/15/2015   Procedure: LAPAROSCOPIC ASSISTED  SIGMOID COLECTOMY;  Surgeon: BuGeorganna SkeansMD;  [facility]: [facility] Service: General;  Laterality: N/A;  SUPRA-UMBILICAL HERNIA N/A 88/41/6606   Procedure: SUPRA-UMBILICAL HERNIA REPAIR WITH MESH;  Surgeon: [name], MD;  Location: [facility];  Service: General;  Laterality: N/A;   TONSILLECTOMY      Outpatient Medications Prior to Visit  Medication Sig Dispense Refill   acetaminophen (TYLENOL) 650 MG CR tablet Take 650 mg by mouth daily.     cetirizine (ZYRTEC) 10 MG tablet Take 10 mg by mouth daily.     Lidocaine 4 % PTCH Apply 1 patch topically daily as needed (pain).     Melatonin 10 MG TABS Take 10 mg by mouth at  bedtime.      Multiple Vitamins-Minerals (MULTIVITAMIN WITH MINERALS) tablet Take 1 tablet by mouth daily.     Omega-3 Fatty Acids (FISH OIL) 1200 MG CAPS Take 1,200-2,400 mg by mouth See admin instructions. Take 2400 mg in the morning and 1200 mg in the evening     oxymetazoline (AFRIN) 0.05 % nasal spray Place 1 spray into both nostrils at bedtime as needed for congestion.     Polyethylene Glycol 3350 (MIRALAX PO) Take by mouth as needed.     Probiotic Product (ALIGN PO) Take by mouth every morning.     Psyllium (METAMUCIL PO)      tamsulosin (FLOMAX) 0.4 MG CAPS capsule Take 0.4 mg by mouth at bedtime.     amLODipine-benazepril (LOTREL) 10-20 MG capsule TAKE 1 CAPSULE BY MOUTH DAILY 30 capsule 0   amoxicillin-clavulanate (AUGMENTIN) 875-125 MG tablet Take 1 tablet by mouth every 12 (twelve) hours. (Patient not taking: Reported on 07/19/2022) 14 tablet 0   atorvastatin (LIPITOR) 20 MG tablet TAKE 1/2 TABLET(10 MG) BY MOUTH DAILY 45 tablet 1   Lactobacillus (ACIDOPHILUS PROBIOTIC PO)  (Patient not taking: Reported on 07/19/2022)     polyethylene glycol-electrolytes (NULYTELY) 420 g solution MIX AND DRINK AS DIRECTED BY MD (Patient not taking: Reported on 07/19/2022)     No facility-administered medications prior to visit.    No Known Allergies  ROS As per HPI  PE:    07/19/2022    8:53 AM 02/18/2022    7:04 AM 02/18/2022    6:00 AM  Vitals with BMI  Height '5\' 5"'$     Weight 236 lbs 10 oz    BMI 30.16    Systolic 010 932 355  Diastolic 69 732 63  Pulse 67 72 78     Physical Exam  Gen: Alert, well appearing.  Patient is oriented to person, place, time, and situation. AFFECT: pleasant, lucid thought and speech. CV: RRR, no m/r/g.   LUNGS: CTA bilat, nonlabored resps, good aeration in all lung fields. ABD: soft, NT EXT: no clubbing or cyanosis.  1+ bilat LL pitting edema.   LABS:  Last CBC Lab Results  Component Value Date   WBC 12.4 (H) 02/18/2022   HGB 14.4 02/18/2022    HCT 43.1 02/18/2022   MCV 88.7 02/18/2022   MCH 29.6 02/18/2022   RDW 13.1 02/18/2022   PLT 220 20/25/4270   Last metabolic panel Lab Results  Component Value Date   GLUCOSE 109 (H) 02/18/2022   NA 139 02/18/2022   K 4.2 02/18/2022   CL 104 02/18/2022   CO2 24 02/18/2022   BUN 16 02/18/2022   CREATININE 0.98 02/18/2022   GFRNONAA >60 02/18/2022   CALCIUM 9.4 02/18/2022   PROT 7.7 12/12/2021   ALBUMIN 4.3 12/12/2021   BILITOT 0.8 12/12/2021   ALKPHOS 49 12/12/2021   AST 22 12/12/2021   ALT 31  12/12/2021   ANIONGAP 11 02/18/2022   Last lipids Lab Results  Component Value Date   CHOL 131 01/15/2022   HDL 47.60 01/15/2022   LDLCALC 71 01/15/2022   LDLDIRECT 171.0 11/18/2006   TRIG 64.0 01/15/2022   CHOLHDL 3 01/15/2022   Last hemoglobin A1c Lab Results  Component Value Date   HGBA1C 5.6 07/19/2022   HGBA1C 5.6 07/19/2022   HGBA1C 5.6 (A) 07/19/2022   HGBA1C 5.6 07/19/2022   IMPRESSION AND PLAN:  #1 hypertension, well controlled on amlodipine-benazepril 10-20, 1 Daily. Electrolytes and creatinine today.  2.  Hypercholesterolemia, doing well on a atorvastatin one half of a 20 mg tab daily. Lipid panel and hepatic panel today.  3.  Prediabetes. POC A1c 5.6%, significantly improved over the last 6 months. Congratulated patient, encouraged him to continue to gradually increase exercise and make better dietary choices.  An After Visit Summary was printed and given to the patient.  FOLLOW UP: Return in about 6 months (around 01/18/2023) for annual CPE (fasting).  Signed:  Crissie Sickles, MD           07/19/2022

## 2022-07-23 ENCOUNTER — Telehealth: Payer: Self-pay | Admitting: Family Medicine

## 2022-07-23 NOTE — Telephone Encounter (Signed)
Noted  

## 2022-07-23 NOTE — Telephone Encounter (Signed)
Called patient back about labs. He called during lunch and advised him per Dr. Anitra Lauth all labs were normal.

## 2022-08-05 ENCOUNTER — Telehealth: Payer: Self-pay | Admitting: Family Medicine

## 2022-08-05 NOTE — Telephone Encounter (Signed)
Left message pt to schedule Welcome to  medicare well visit.

## 2022-08-11 DIAGNOSIS — D485 Neoplasm of uncertain behavior of skin: Secondary | ICD-10-CM | POA: Diagnosis not present

## 2022-08-11 DIAGNOSIS — D2361 Other benign neoplasm of skin of right upper limb, including shoulder: Secondary | ICD-10-CM | POA: Diagnosis not present

## 2022-08-11 DIAGNOSIS — L570 Actinic keratosis: Secondary | ICD-10-CM | POA: Diagnosis not present

## 2022-08-11 DIAGNOSIS — Z85828 Personal history of other malignant neoplasm of skin: Secondary | ICD-10-CM | POA: Diagnosis not present

## 2022-08-11 DIAGNOSIS — L821 Other seborrheic keratosis: Secondary | ICD-10-CM | POA: Diagnosis not present

## 2022-08-11 DIAGNOSIS — L814 Other melanin hyperpigmentation: Secondary | ICD-10-CM | POA: Diagnosis not present

## 2022-08-14 ENCOUNTER — Other Ambulatory Visit: Payer: Self-pay | Admitting: Family Medicine

## 2022-09-13 ENCOUNTER — Telehealth: Payer: Self-pay | Admitting: Family Medicine

## 2022-09-13 ENCOUNTER — Telehealth: Payer: Self-pay

## 2022-09-13 MED ORDER — NIRMATRELVIR/RITONAVIR (PAXLOVID)TABLET: 3.0000 | ORAL_TABLET | Freq: Two times a day (BID) | ORAL | 0 refills | Status: AC

## 2022-09-13 NOTE — Telephone Encounter (Signed)
Wife passed away, her funeral is tomorrow.  He tested + for COVID on Saturday, started symptoms on Friday. No fever.  Suggestions?  N-95 Mask?  He cannot cancel funeral.  He said he will stand outside & away from everyone.  I suggested to wear gloves.  [facility] - [place].

## 2022-09-13 NOTE — Telephone Encounter (Signed)
Please Advise

## 2022-09-13 NOTE — Telephone Encounter (Signed)
Paxlovid eRx'd. Yes okay to continue with funeral plans.  Wear mask. My condolences.

## 2022-09-13 NOTE — Telephone Encounter (Signed)
Pt called and reports he has Covid. He declined scheduling an appt virtual or in person at this time. He is currently in a bind due to his wife recently passing away and tomorrow is the funeral services. He asking for a call back regarding advice and if a prescription is advised and if he can continue with the funeral services. Please give the patient a call back at [phone].

## 2022-09-14 NOTE — Telephone Encounter (Signed)
Patient was given recommendations. He has already started antiviral.

## 2022-09-29 ENCOUNTER — Telehealth: Payer: Self-pay | Admitting: Family Medicine

## 2022-09-29 NOTE — Telephone Encounter (Signed)
Patient advised we will evaluate during his appointment. He voiced understanding.

## 2022-09-29 NOTE — Telephone Encounter (Signed)
Patient continues to have ongoing Covid symptoms since Dec 9th. He is also concerned that it could be additional symptoms from recent exposure with a cat that died suspiciously that may have had rabies. The patient spoke with the ED about that and they did not think he had rabies based on symptoms. He was exposed to the cat on Dec. 20th. And is scheduled for this coming Friday the 29th. Please call the patient and advise as he is very concerned that he continues to have Covid although he has taken treatment.

## 2022-09-30 NOTE — Progress Notes (Signed)
OFFICE VISIT  10/01/2022  CC:  Chief Complaint  Patient presents with   Covid Positive    Pt initially tested positive on 12/9, given antiviral and took until completion. He tested negative within 10 days of 12/9. He recently did another covid test (positive). Current symptoms are chest and head congestion, scratchy throat, soreness. Denies n/v/d or fever. Used Mucinex for phlegm/chest congestion. Has not taken any in the last few days.     Patient is a 66 y.o. male who presents for ongoing resp sx's.  HPI: 3 wks ago onset of nasal cong, scratchy throat, cough-->covid + at home, took paxlovid.  Felt better.  Had neg home covid test.  Then couple days later sx's worsened again-->covid retest positive.   This morning his home covid test was NEG. No fevers.  No shortness of breath or wheezing.  He is eating and drinking well. Has not required cough or cold medicine in the last several days.  He has a couple months history of fairly focal pain in the medial aspect of the proximal tibia bilaterally.  Waxes and wanes in intensity.  Right greater than left.  No swelling in the knees, no redness.  No other joints bother him right now.  Past Medical History:  Diagnosis Date   Allergic rhinitis    Anxiety and depression    Basal cell carcinoma 01/2022   Left arm--Derm referral   BPH (benign prostatic hyperplasia)    Nocturia is his primary symptom:  Takes Flomax   Diverticulitis large intestine 2015/16   Recurrent: GI MD [name].  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   Eustachian tube dysfunction    with recurrent "sinus issues"--ENT (Dr. [name]) recommended surgery for nasal turbinate reduction and septoplasty--as of 11/03/15)   GERD (gastroesophageal reflux disease)    Hepatic steatosis    History of adenomatous polyp of colon    History of kidney stones    Hyperlipidemia    Hypertension    Started med 03/2018   IBS (irritable bowel  syndrome)    Inguinal pain, left    Chronic; [name] to do MRI pelvis w/out contrast 08/2017 but insurance denied it.   Nephrolithiasis 12/23/2014   22m nonobstructive R renal calc on CT abd/pelv done for diverticulitis. 04/2021 6 mm nonobstructing stone R kidney, slight R renal pelviectasis, no hydronephrosis.   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  Recall 07/2017 per Dr. MWatt Climes  COLONOSCOPY  08/24/2017   Diverticulosis from transverse colon to sigmoid.  Otherwise normal.  Repeat in 5-10 years for screening/Dr. MWatt Climes  ESOPHAGOGASTRODUODENOSCOPY     need specifics from pt   HERNIA REPAIR     multiple.  Right inguinal repair by Dr. BGeorganna Skeansin 2004.  07/2017 having some muscular/nerve pain in groin--evaluated by Dr. TAlice Reichertw/u at that time.   INCISIONAL HERNIA REPAIR N/A 04/22/2020   Procedure: HERNIA REPAIR INCISIONAL WITH MESH;  Surgeon: TGeorganna Skeans MD;  Location: [facility]  Service: General;  Laterality: N/A;   LAPAROSCOPIC SIGMOID COLECTOMY N/A 09/15/2015   Procedure: LAPAROSCOPIC ASSISTED  SIGMOID COLECTOMY;  Surgeon: [name], MD;  Location: [facility];  Service: General;  Laterality: N/A;   SUPRA-UMBILICAL HERNIA N/A 04/54/0981   Procedure: SUPRA-UMBILICAL HERNIA REPAIR WITH MESH;  Surgeon: [name], MD;  Location: [facility];  Service: General;  Laterality: N/A;   TONSILLECTOMY      Outpatient Medications Prior to Visit  Medication Sig Dispense Refill   acetaminophen (TYLENOL) 650 MG CR tablet Take 650 mg by mouth daily.     amLODipine-benazepril (LOTREL) 10-20 MG capsule TAKE 1 CAPSULE BY MOUTH DAILY 90 capsule 1   atorvastatin (LIPITOR) 20 MG tablet TAKE 1/2  TABLET(10 MG) BY MOUTH DAILY 45 tablet 1   cetirizine (ZYRTEC) 10 MG tablet Take 10 mg by mouth daily.     Lidocaine 4 % PTCH Apply 1 patch topically daily as needed (pain).     Melatonin 10 MG TABS Take 10 mg by mouth at bedtime.      Multiple Vitamins-Minerals (MULTIVITAMIN WITH MINERALS) tablet Take 1 tablet by mouth daily.     Omega-3 Fatty Acids (FISH OIL) 1200 MG CAPS Take 1,200-2,400 mg by mouth See admin instructions. Take 2400 mg in the morning and 1200 mg in the evening     oxymetazoline (AFRIN) 0.05 % nasal spray Place 1 spray into both nostrils at bedtime as needed for congestion.     Polyethylene Glycol 3350 (MIRALAX PO) Take by mouth as needed.     Probiotic Product (ALIGN PO) Take by mouth every morning.     Psyllium (METAMUCIL PO)      tamsulosin (FLOMAX) 0.4 MG CAPS capsule Take 0.4 mg by mouth at bedtime.     No facility-administered medications prior to visit.    No Known Allergies  Review of Systems  As per HPI  PE:    10/01/2022    8:06 AM 07/19/2022    8:53 AM 02/18/2022    7:04 AM  Vitals with BMI  Height '5\' 5"'$  '5\' 5"'$    Weight 240 lbs 13 oz 236 lbs 10 oz   BMI 19.14 78.29   Systolic 562 130 865  Diastolic 71 69 784  Pulse 72 67 72     Physical Exam  VS: noted--normal. Gen: alert, NAD, well-appearing. HEENT: eyes without injection, drainage, or swelling.  Ears: EACs clear, TMs with normal light reflex and landmarks.  Nose: Clear rhinorrhea, with some dried, crusty exudate adherent to mildly injected mucosa.  No purulent d/c.  No paranasal sinus TTP.  No facial swelling.  Throat and mouth without focal lesion.  No pharyngial swelling, erythema, or exudate.   Neck: supple, no LAD.   LUNGS: CTA bilat, nonlabored resps.   CV: RRR, no m/r/g. EXT: no c/c/e.    Knees: No erythema or swelling.  He has focal tenderness to palpation of the pes anserine bilaterally.  Range of motion of the knees is fully intact.  No instability.  SKIN: no rash   LABS:  Last  metabolic panel Lab Results  Component Value Date   GLUCOSE 103 (H) 07/19/2022   NA 140 07/19/2022   K 4.7 07/19/2022   CL 102 07/19/2022   CO2 27 07/19/2022   BUN 17 07/19/2022   CREATININE 1.17 07/19/2022   GFRNONAA >60 02/18/2022   CALCIUM 9.5 07/19/2022   PROT 7.0 07/19/2022   ALBUMIN 4.4 07/19/2022   BILITOT 0.8 07/19/2022   ALKPHOS 62 07/19/2022   AST 29 07/19/2022   ALT 39 07/19/2022   ANIONGAP 11 02/18/2022    IMPRESSION  AND PLAN:  #1 COVID-19 respiratory illness. Gradually resolving.  I do not see any sign of complication at this time. Reassured. Continue as needed use of over-the-counter cold medicines.  2.  Bilateral pes anserine bursitis. Discussed activity restriction as needed, application of ice to the areas as needed. If becomes more persistent and severe then he can return and we can do steroid injection.  An After Visit Summary was printed and given to the patient.  FOLLOW UP: Return if symptoms worsen or fail to improve.  Signed:  Crissie Sickles, MD           10/01/2022

## 2022-10-01 ENCOUNTER — Ambulatory Visit (INDEPENDENT_AMBULATORY_CARE_PROVIDER_SITE_OTHER): Payer: Medicare Other | Admitting: Family Medicine

## 2022-10-01 ENCOUNTER — Encounter: Payer: Self-pay | Admitting: Family Medicine

## 2022-10-01 VITALS — BP 120/71 | HR 72 | Temp 98.8°F | Ht 65.0 in | Wt 240.8 lb

## 2022-10-01 DIAGNOSIS — U071 COVID-19: Secondary | ICD-10-CM | POA: Diagnosis not present

## 2022-10-01 DIAGNOSIS — M25562 Pain in left knee: Secondary | ICD-10-CM | POA: Diagnosis not present

## 2022-10-01 DIAGNOSIS — J988 Other specified respiratory disorders: Secondary | ICD-10-CM | POA: Diagnosis not present

## 2022-10-01 DIAGNOSIS — M7050 Other bursitis of knee, unspecified knee: Secondary | ICD-10-CM

## 2022-10-01 DIAGNOSIS — M25561 Pain in right knee: Secondary | ICD-10-CM

## 2022-10-04 HISTORY — PX: KNEE ARTHROSCOPY: SUR90

## 2022-10-04 HISTORY — PX: HERNIA REPAIR: SHX51

## 2022-10-13 IMAGING — CT CT ABD-PELV W/ CM
2 of 5 series · 15 of 46 positions shown, 17 images · IV contrast (agent unspecified)
Comparison: CTs with IV contrast 12/12/2021 and 05/12/2015.

CLINICAL DATA: Left lower quadrant pain. History of diverticulitis.

EXAM:
CT ABDOMEN AND PELVIS WITH CONTRAST
TECHNIQUE: Multidetector CT imaging of the abdomen and pelvis was performed
using the standard protocol following bolus administration of
intravenous contrast.

[Series 2: abd pel w · axial · 0.91mm/px · z∈[+839,+1234]mm · 12 of 89 slices shown, 14 images]
[im 5/89  soft-tissue]
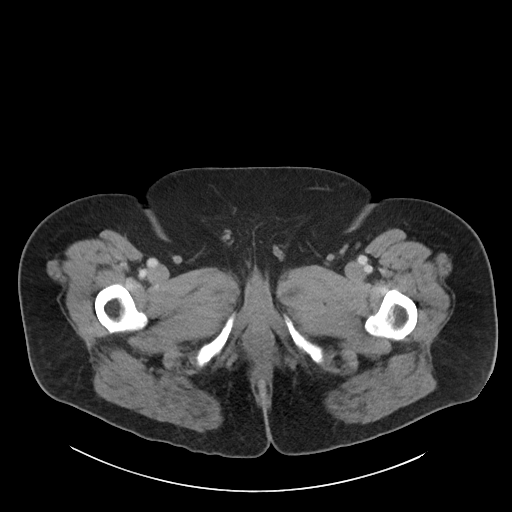
[im 5/89  bone]
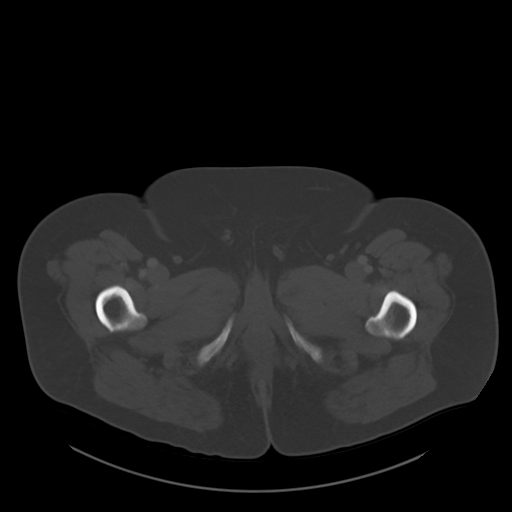
[im 14/89  soft-tissue]
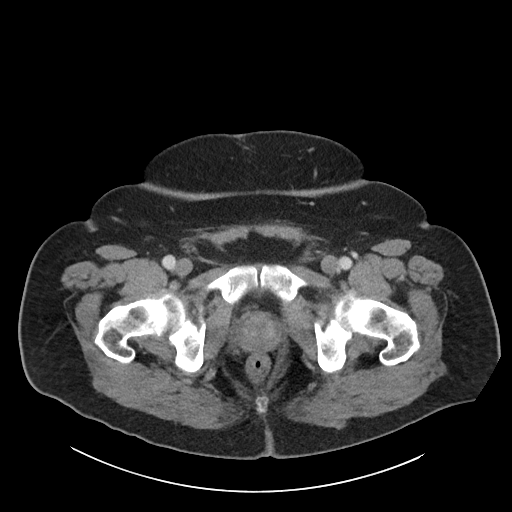
[im 19/89  soft-tissue]
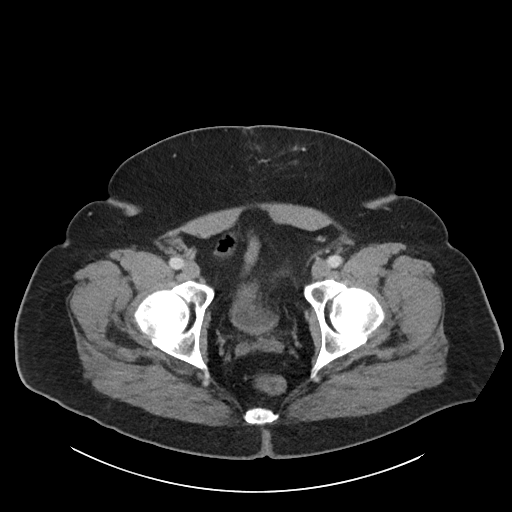
[im 28/89  soft-tissue]
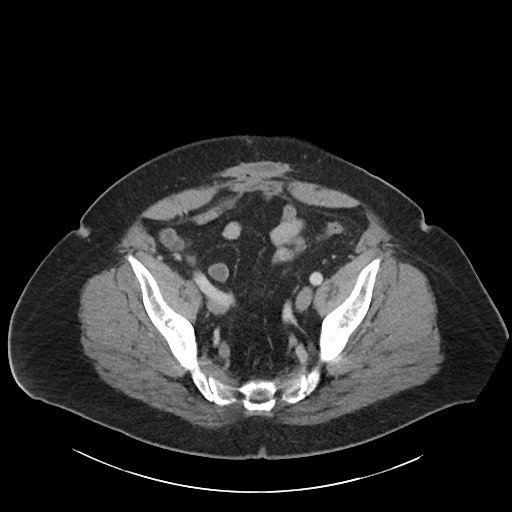
[im 33/89  soft-tissue]
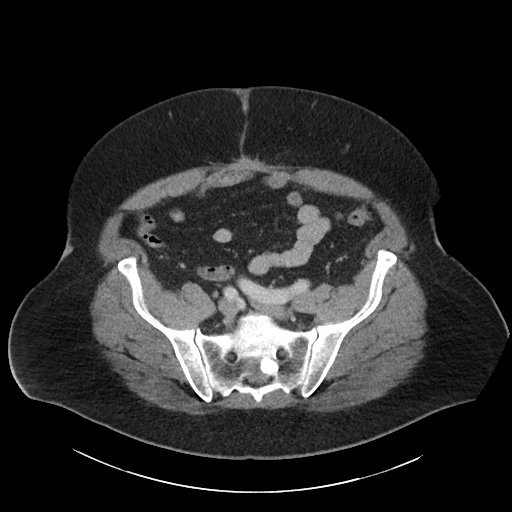
[im 42/89  soft-tissue]
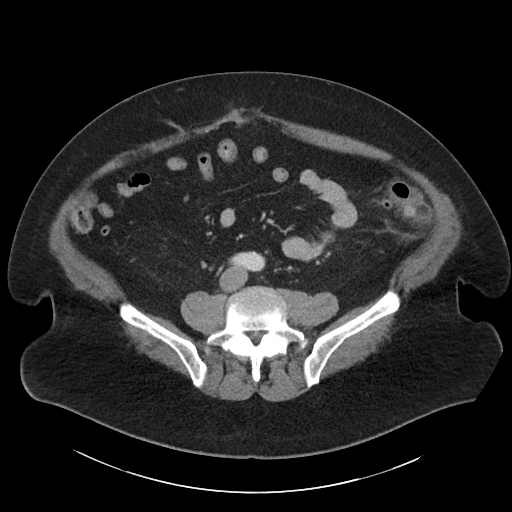
[im 47/89  soft-tissue]
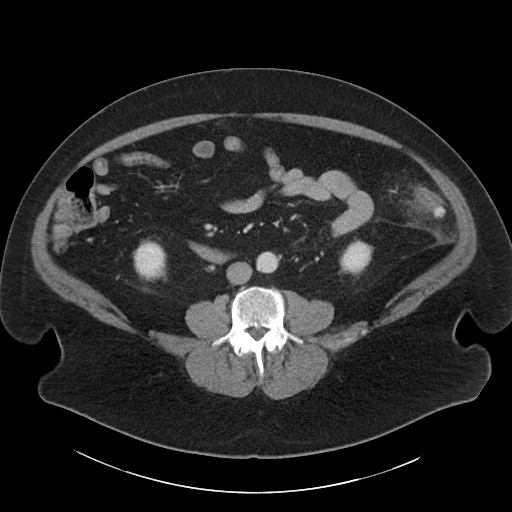
[im 56/89  soft-tissue]
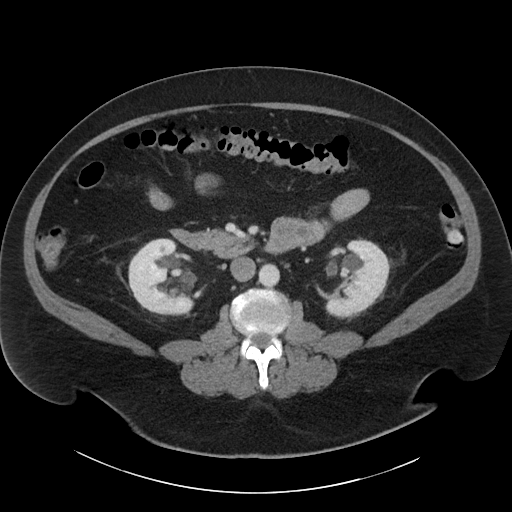
[im 61/89  soft-tissue]
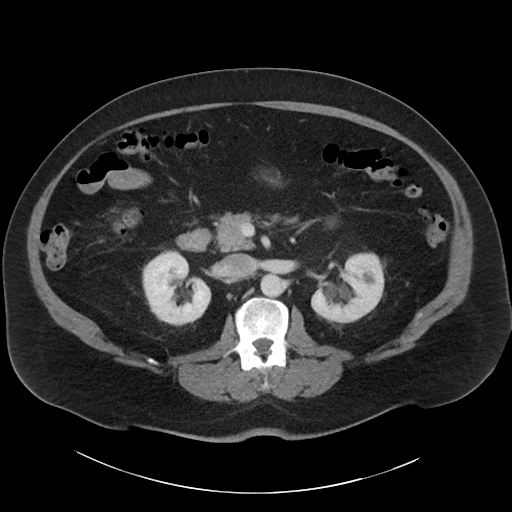
[im 61/89  bone]
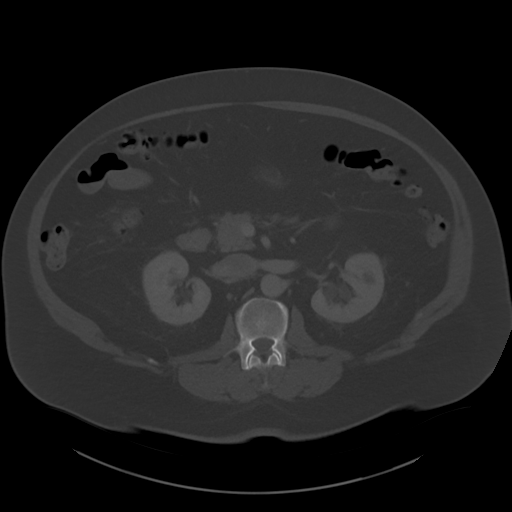
[im 70/89  soft-tissue]
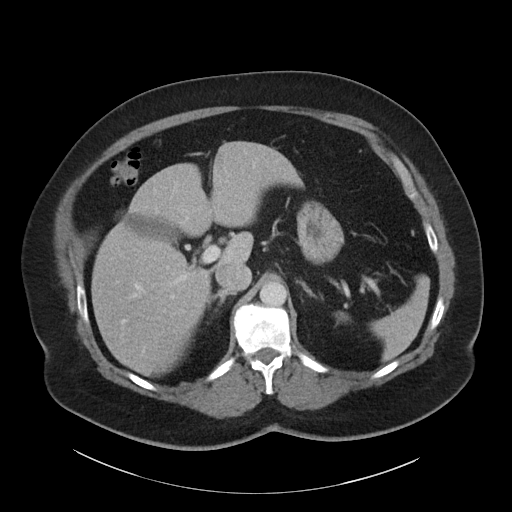
[im 75/89  soft-tissue]
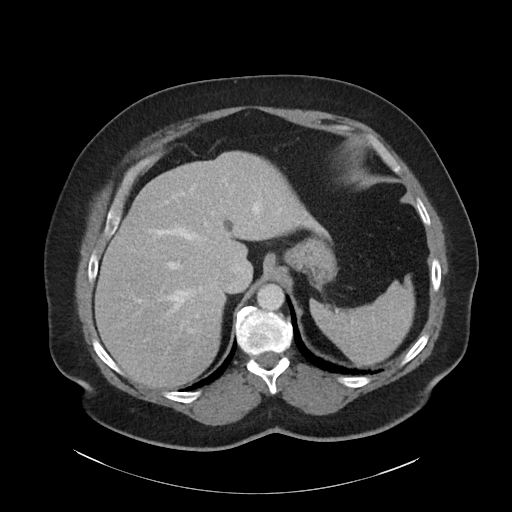
[im 84/89  soft-tissue]
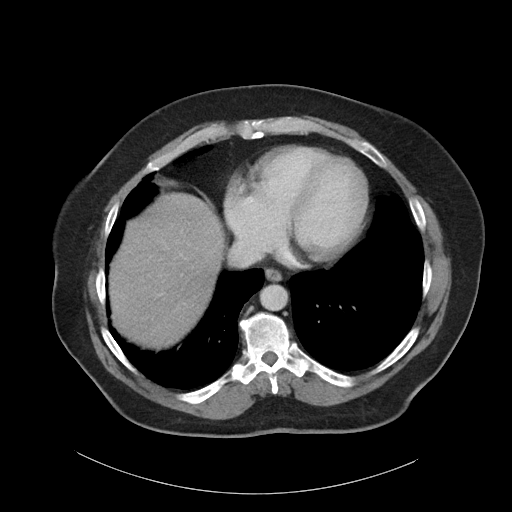

[Series 5: coronal · coronal · 0.89mm/px · 3 of 121 slices shown]
[im 41/121  soft-tissue]
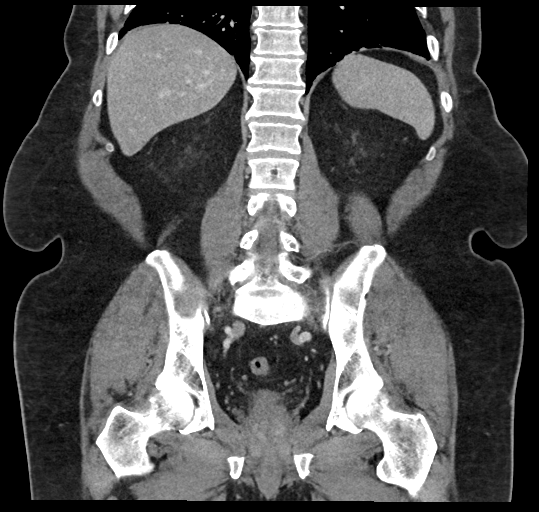
[im 54/121  soft-tissue]
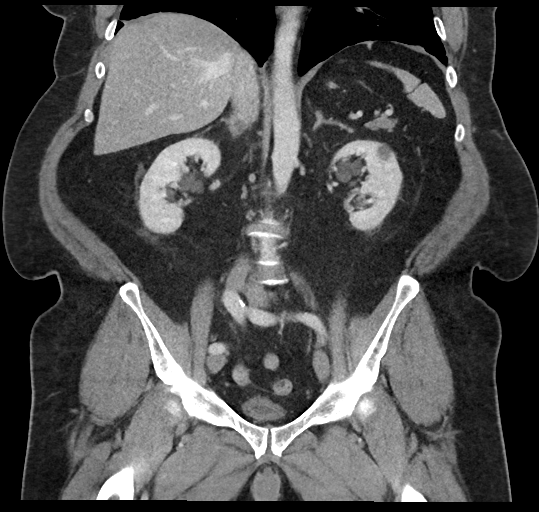
[im 67/121  soft-tissue]
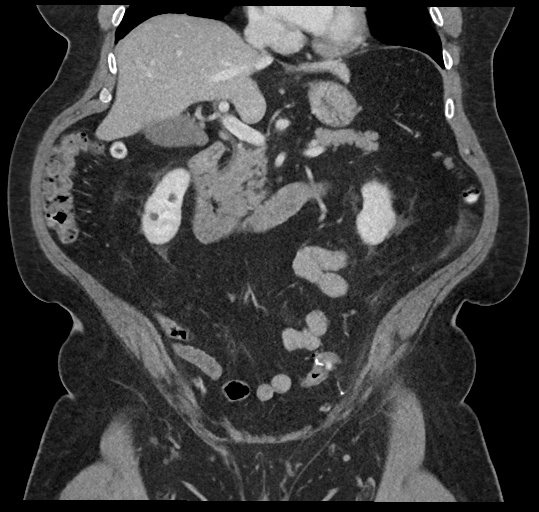

[15 of 46 positions shown; findings below may reference images not displayed]

RADIATION DOSE REDUCTION: This exam was performed according to the
departmental dose-optimization program which includes automated
exposure control, adjustment of the mA and/or kV according to
patient size and/or use of iterative reconstruction technique.

CONTRAST:  100mL OMNIPAQUE IOHEXOL 300 MG/ML  SOLN
FINDINGS: Lower chest: There are scattered linear scarring or atelectasis in
the lung bases without infiltrates. The cardiac size is normal.

Hepatobiliary: The liver is 15 cm length mildly steatotic. A small
cyst is again noted in segment 4A but there is no mass enhancement.
The gallbladder and bile ducts are unremarkable.

Pancreas: No focal abnormality.

Spleen: No focal abnormality or splenomegaly.

Adrenals/Urinary Tract: There is no adrenal mass. There are small
bilateral renal cysts including parapelvic cysts.

There are several bilateral additional too small to characterize
cortical hypodensities which are unchanged and probably also cysts.

There is a 4 mm nonobstructive caliceal stone in the inferior pole
right kidney.

No other stone is seen. There is no ureteral stone or
hydronephrosis.

The bladder is contracted and not well seen but previously has been
unremarkable.

Stomach/Bowel: Unremarkable contracted stomach, unopacified small
bowel. The appendix is normal and well visible. There are diffuse
colonic diverticula. In approximally the mid third of the descending
colon there is a 9 cm segment with interval increased wall
thickening and surrounding inflammatory stranding consistent with
acute diverticulitis. This overlaps but is not entirely the same
segment that was inflamed on the last CT.

Surgical anastomoses are again noted in the distal descending and
proximal sigmoid colon with no other focal inflammatory changes
being seen.

Vascular/Lymphatic: Aortic atherosclerosis. No enlarged abdominal or
pelvic lymph nodes.

Reproductive: No prostatomegaly.

Other: There is trace nonlocalizing reactive fluid in the left
paracolic gutter associated with diverticulitis but there is no
localizing collection, free air, free hemorrhage or abscess.

There is supraumbilical rectus diastasis and fat hernia similar to
the previous exam and evidence of prior attempted repair.

There is a subcutaneous lentiform shaped rim enhancing collection
eccentric to the left of midline in the low anterior wall today
measuring 4.8 x 1.6 cm, previously 4.8 x 2.3 cm, and otherwise
unchanged.

Musculoskeletal: A bone island is again noted in the left
hemisacrum. This is unchanged. There are degenerative changes of the
thoracic and lumbar spine. No destructive bone lesion is seen. There
is a small right inguinal fat hernia.
IMPRESSION: 1. Acute diverticulitis in approximately the mid third of the
descending colon which overlaps but does not completely include the
area of prior diverticulitis on the last CT. There is minimal
associated nonlocalizing reactive fluid posterior to the diseased
segment but there is no free air or abscess. Colonoscopy follow-up
suggested after treatment.
2. Other diffuse diverticula are uncomplicated. Evidence of prior
surgical anastomosis in the distal descending and proximal sigmoid
colon.
3. Rim enhancing lentiform collection in the subcutaneous plane in
the infraumbilical abdominal wall is slightly smaller than
previously probably a seroma or hematoma, less likely infectious
process.
4. Renal cysts and additional too small to characterize
hypodensities. Nonobstructive nephrolithiasis.
5. Supraumbilical rectus diastasis and fat hernia.
6. Aortic atherosclerosis and remaining findings discussed above.

## 2022-10-25 ENCOUNTER — Ambulatory Visit (INDEPENDENT_AMBULATORY_CARE_PROVIDER_SITE_OTHER): Payer: Medicare Other | Admitting: Family Medicine

## 2022-10-25 ENCOUNTER — Encounter: Payer: Self-pay | Admitting: Family Medicine

## 2022-10-25 VITALS — BP 123/74 | HR 67 | Temp 97.9°F | Ht 65.0 in | Wt 244.4 lb

## 2022-10-25 DIAGNOSIS — M7051 Other bursitis of knee, right knee: Secondary | ICD-10-CM | POA: Diagnosis not present

## 2022-10-25 DIAGNOSIS — M7050 Other bursitis of knee, unspecified knee: Secondary | ICD-10-CM

## 2022-10-25 MED ORDER — TRIAMCINOLONE ACETONIDE 40 MG/ML IJ SUSP
20.0000 mg | Freq: Once | INTRAMUSCULAR | Status: AC
  Administered 2022-10-25: 20 mg via INTRA_ARTICULAR

## 2022-10-25 NOTE — Progress Notes (Signed)
OFFICE VISIT  10/25/2022  CC:  Chief Complaint  Patient presents with   Knee Pain    Right, x1 month. Has been using ice and heat alternating. Seen 10/01/22, it was discussed "If becomes more persistent and severe then he can return and we can do steroid injection."    Patient is a 67 y.o. male who presents for ongoing right knee pain. A/P as of last visit on 10/01/2022: " Bilateral pes anserine bursitis. Discussed activity restriction as needed, application of ice to the areas as needed. If becomes more persistent and severe then he can return and we can do steroid injection."  HPI: Left knee is significantly better. Right knee with same pain, bothers him most of the day.  Sometimes wakes him up at night when he happens to twist his leg a certain way.  He has been icing it fairly regularly.  He does have a history of osteoarthritis of both hips.  Past Medical History:  Diagnosis Date   Allergic rhinitis    Anxiety and depression    Basal cell carcinoma 01/2022   Left arm--Derm referral   BPH (benign prostatic hyperplasia)    Nocturia is his primary symptom:  Takes Flomax   Diverticulitis large intestine 2015/16   Recurrent: GI MD [name].  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   Eustachian tube dysfunction    with recurrent "sinus issues"--ENT (Dr. [name]) recommended surgery for nasal turbinate reduction and septoplasty--as of 11/03/15)   GERD (gastroesophageal reflux disease)    Hepatic steatosis    History of adenomatous polyp of colon    History of kidney stones    Hyperlipidemia    Hypertension    Started med 03/2018   IBS (irritable bowel syndrome)    Inguinal pain, left    Chronic; [name] to do MRI pelvis w/out contrast 08/2017 but insurance denied it.   Nephrolithiasis 12/23/2014   70m nonobstructive R renal calc on CT abd/pelv done for diverticulitis. 04/2021 6 mm nonobstructing stone R kidney, slight R renal  pelviectasis, no hydronephrosis.   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  Recall 07/2017 per Dr. MWatt Climes  COLONOSCOPY  08/24/2017   Diverticulosis from transverse colon to sigmoid.  Otherwise normal.  Repeat in 5-10 years for screening/Dr. MWatt Climes  ESOPHAGOGASTRODUODENOSCOPY     need specifics from pt   HERNIA REPAIR     multiple.  Right inguinal repair by Dr. BGeorganna Skeansin 2004.  07/2017 having some muscular/nerve pain in groin--evaluated by Dr. TAlice Reichertw/u at that time.   INCISIONAL HERNIA REPAIR N/A 04/22/2020   Procedure: HERNIA REPAIR INCISIONAL WITH MESH;  Surgeon: TGeorganna Skeans MD;  [place] [facility]  Service: General;  Laterality: N/A;   LAPAROSCOPIC SIGMOID COLECTOMY N/A 09/15/2015   Procedure: LAPAROSCOPIC ASSISTED  SIGMOID COLECTOMY;  Surgeon: BGeorganna Skeans MD;  [place] [facility]  Service: General;  Laterality: N/A;   SUPRA-UMBILICAL HERNIA N/A 102/58/5277  Procedure: SUPRA-UMBILICAL HERNIA REPAIR WITH MESH;  Surgeon: TGeorganna Skeans MD;  Location: [facility]  Service: General;  Laterality: N/A;   TONSILLECTOMY  Outpatient Medications Prior to Visit  Medication Sig Dispense Refill   acetaminophen (TYLENOL) 650 MG CR tablet Take 650 mg by mouth daily.     amLODipine-benazepril (LOTREL) 10-20 MG capsule TAKE 1 CAPSULE BY MOUTH DAILY 90 capsule 1   atorvastatin (LIPITOR) 20 MG tablet TAKE 1/2 TABLET(10 MG) BY MOUTH DAILY 45 tablet 1   cetirizine (ZYRTEC) 10 MG tablet Take 10 mg by mouth daily.     Lidocaine 4 % PTCH Apply 1 patch topically daily as needed (pain).     Melatonin 10 MG TABS Take 10 mg by mouth at bedtime.      Multiple Vitamins-Minerals (MULTIVITAMIN WITH  MINERALS) tablet Take 1 tablet by mouth daily.     Omega-3 Fatty Acids (FISH OIL) 1200 MG CAPS Take 1,200-2,400 mg by mouth See admin instructions. Take 2400 mg in the morning and 1200 mg in the evening     oxymetazoline (AFRIN) 0.05 % nasal spray Place 1 spray into both nostrils at bedtime as needed for congestion.     Polyethylene Glycol 3350 (MIRALAX PO) Take by mouth as needed.     Probiotic Product (ALIGN PO) Take by mouth every morning.     Psyllium (METAMUCIL PO)      tamsulosin (FLOMAX) 0.4 MG CAPS capsule Take 0.4 mg by mouth at bedtime.     No facility-administered medications prior to visit.    No Known Allergies  Review of Systems  As per HPI  PE:    10/25/2022    9:58 AM 10/01/2022    8:06 AM 07/19/2022    8:53 AM  Vitals with BMI  Height '5\' 5"'$  '5\' 5"'$  '5\' 5"'$   Weight 244 lbs 6 oz 240 lbs 13 oz 236 lbs 10 oz  BMI 40.67 00.17 49.44  Systolic 967 591 638  Diastolic 74 71 69  Pulse 67 72 67   Physical Exam  Gen: Alert, well appearing.  Patient is oriented to person, place, time, and situation. Right knee without effusion, erythema, or warmth. He has some focal tenderness to palpation over the medial aspect of the proximal tibia.  No erythema or swelling in this region.  LABS:  Last CBC Lab Results  Component Value Date   WBC 12.4 (H) 02/18/2022   HGB 14.4 02/18/2022   HCT 43.1 02/18/2022   MCV 88.7 02/18/2022   MCH 29.6 02/18/2022   RDW 13.1 02/18/2022   PLT 220 46/65/9935   Last metabolic panel Lab Results  Component Value Date   GLUCOSE 103 (H) 07/19/2022   NA 140 07/19/2022   K 4.7 07/19/2022   CL 102 07/19/2022   CO2 27 07/19/2022   BUN 17 07/19/2022   CREATININE 1.17 07/19/2022   GFRNONAA >60 02/18/2022   CALCIUM 9.5 07/19/2022   PROT 7.0 07/19/2022   ALBUMIN 4.4 07/19/2022   BILITOT 0.8 07/19/2022   ALKPHOS 62 07/19/2022   AST 29 07/19/2022   ALT 39 07/19/2022   ANIONGAP 11 02/18/2022   IMPRESSION AND PLAN:  Right knee pes anserine  bursitis. Patient elected for steroid injection today.  Ultrasound-guided injection is preferred based on studies that show increased duration, increased effect, greater accuracy, decreased procedural pain, increased response rate, and decreased cost with ultrasound-guided versus blind injection. Procedure: Real-time ultrasound guided injection of right knee pes anserine. Device: [employer] informed consent obtained.  Timeout conducted.  No overlying erythema, induration, or other signs of local infection. After sterile prep with Betadine, injected A mixture of 20 mg Kenalog  and 2 cc of 2% lidocaine without epinephrine using 25-gauge 1-1/2 inch needle. Patient tolerated the procedure well.  No immediate complications.  Post-injection care discussed. Advised to call if fever/chills, erythema, drainage, or persistent bleeding.  Impression: Technically successful ultrasound-guided injection.  An After Visit Summary was printed and given to the patient.  FOLLOW UP: Return if symptoms worsen or fail to improve.  Signed:  Crissie Sickles, MD           10/25/2022

## 2022-11-11 ENCOUNTER — Telehealth: Payer: Self-pay

## 2022-11-11 NOTE — Telephone Encounter (Signed)
LVM for pt to call back in regards to scheduling AWV with Dr. Anitra Lauth  11/11/2022'@currenttime'$ @

## 2022-11-15 ENCOUNTER — Encounter: Payer: Self-pay | Admitting: Family Medicine

## 2022-11-15 DIAGNOSIS — M25561 Pain in right knee: Secondary | ICD-10-CM

## 2022-11-15 NOTE — Telephone Encounter (Signed)
Okay, referral ordered

## 2022-11-23 ENCOUNTER — Encounter: Payer: Self-pay | Admitting: Family Medicine

## 2022-11-23 ENCOUNTER — Ambulatory Visit (INDEPENDENT_AMBULATORY_CARE_PROVIDER_SITE_OTHER): Payer: Medicare Other | Admitting: Family Medicine

## 2022-11-23 VITALS — BP 127/79 | HR 69 | Temp 98.1°F | Ht 65.0 in | Wt 240.0 lb

## 2022-11-23 DIAGNOSIS — Z0000 Encounter for general adult medical examination without abnormal findings: Secondary | ICD-10-CM | POA: Diagnosis not present

## 2022-11-23 NOTE — Patient Instructions (Signed)
Take 500 mg magnesium oxide every evening for prevention of muscle cramps. Also, drink 3 ounces of tonic water every night.

## 2022-11-23 NOTE — Progress Notes (Addendum)
WELCOME TO MEDICARE (IPPE) VISIT I explained that today's visit was for the purpose of health promotion and disease detection, as well as an introduction to Medicare and it's covered benefits.  I explained that no labs or other services would be performed today, but if any were determined to be necessary then appropriate orders/referrals would be arranged for these to be done at a future date.  Patient is a 67 y/o male who is already an established patient with me. .  Pt's medical and social history were reviewed. Specifically, we reviewed PMH/PSH/Meds/FH.  Also reviewed alcohol, tobacco, and illicit drug use.  Diet and physical activity reviewed.   All of this info is also found in the appropriate sections of pt's EMR.  Pt was screened with appropriate screening instrument for depression.  Current or past experiences with mood disorders was discussed.       11/23/2022   10:23 AM  Depression screen PHQ 2/9  Decreased Interest 1  Down, Depressed, Hopeless 1  PHQ - 2 Score 2  Discussed today: He is appropriately grieving the loss of his wife 2 months ago. He has the support of siblings and he has been attending church on Sundays. No SI or HI.     02/18/2022    5:15 AM 07/14/2022    5:44 PM 10/25/2022   10:03 AM 11/22/2022    8:02 AM 11/23/2022   10:22 AM  Fall Risk  Falls in the past year?  1 0 1 1  Was there an injury with Fall?  0 0 0 0  Fall Risk Category Calculator  2 0 1 1  Fall Risk Category (Retired)  Moderate     (RETIRED) Patient Fall Risk Level Low fall risk      Patient at Risk for Falls Due to   No Fall Risks    Fall risk Follow up   Falls evaluation completed  Falls evaluation completed   Pt's functional ability and level of safety were reviewed. Specifically, I screened for hearing impairment and fall risk.  I assessed home safety and we discussed pt's competency with activities of daily living.   He is independent all ADLs  EXAM: Vitals:   11/23/22 1015  BP:  127/79  Pulse: 69  Temp: 98.1 F (36.7 C)  SpO2: 95%   Body mass index is 39.94 kg/m. Visual acuity screen:    Hearing Screening   500Hz$  1000Hz$  2000Hz$  4000Hz$   Right ear 20 20 20 20  $ Left ear 20 20 20 20   $ Vision Screening   Right eye Left eye Both eyes  Without correction 20/50 20/40 20/40 $  With correction     Pt has corrective lenses but is not wearing them today. Pt gets routine eye care through optometrist/ophthalmologist and is up to date with follow up care with this provider. No additional physical exam required or indicated today.  End of life planning: Advanced directives and power of attorney information specific to the patient were discussed.  Blue book given and discussed today.  Education, counseling, and referrals based on the information obtained/reviewed today: none  Education, counseling, and referral for other preventive services: Written checklist was completed and given to pt for obtaining, as appropriate, the other preventive services that are covered as separate Medicare Part B benefits. Possible services that were reviewed with pt are:  -annual wellness visit (AWV) -Bone mass measurements: n/a -Cardiovascular screening blood tests: discussed/pt gets these -Colorectal cancer screening: hx polyps, last colonoscopy 2018->rpt 7 yrs -Counseling to  prevent tobacco use: n/a -Diabetes screening tests: pt w/prediabetes -Diabetes self-management training (DSMT): discussed -Glaucoma screening: discussed -HIV screening: discussed -Medical nutrition therapy: discussed -Prostate cancer screening: next due 01/2023 -Seasonal influenza, pneumococcal, and Hep B vaccines: all UTD -screening mammography: n/a -screening pap tests and pelvic exam: n/a -ultrasound screening for AAA: not indicated  Of the above listed services,  no referrals were made today.  Patient did have an additional complaint/problem that was discussed and evaluated today: Recent severe muscle  cramps in thighs. Doing better in the last few days on apple cider vinegar.  He knows he needs to drink more fluids. No recent overuse. I recommended he start over-the-counter magnesium oxide 500 mg nightly and 3 ounces of tonic water nightly.  Regarding his grief for the death of his wife I gave encouragement today and told him that if symptoms get more severe or if symptoms are prolonged greater than 6 months then return.  Patient was given opportunity to ask any additional questions regarding Medicare and covered benefits.  Patient was informed that Medicare does not provide coverage for routine physical exams.  I answered all questions to the best of my ability today.    Follow up: Return for keep appt set for April for CPE.  Signed:  Crissie Sickles, MD           11/23/2022

## 2022-12-06 ENCOUNTER — Other Ambulatory Visit: Payer: Self-pay | Admitting: Urology

## 2022-12-06 DIAGNOSIS — N200 Calculus of kidney: Secondary | ICD-10-CM | POA: Diagnosis not present

## 2022-12-06 DIAGNOSIS — N281 Cyst of kidney, acquired: Secondary | ICD-10-CM | POA: Diagnosis not present

## 2022-12-06 NOTE — Progress Notes (Signed)
Left voice mail to return call for instructions

## 2022-12-07 NOTE — Progress Notes (Signed)
Pre op phone call completed.  NPO after midnight. Arrive at [facility]. Meds ok.

## 2022-12-08 ENCOUNTER — Other Ambulatory Visit: Payer: Self-pay

## 2022-12-08 ENCOUNTER — Encounter: Payer: Self-pay | Admitting: Orthopaedic Surgery

## 2022-12-08 ENCOUNTER — Ambulatory Visit (INDEPENDENT_AMBULATORY_CARE_PROVIDER_SITE_OTHER): Payer: Medicare Other

## 2022-12-08 ENCOUNTER — Ambulatory Visit (INDEPENDENT_AMBULATORY_CARE_PROVIDER_SITE_OTHER): Payer: Medicare Other | Admitting: Orthopaedic Surgery

## 2022-12-08 VITALS — Ht 65.0 in | Wt 239.4 lb

## 2022-12-08 DIAGNOSIS — G8929 Other chronic pain: Secondary | ICD-10-CM

## 2022-12-08 DIAGNOSIS — M25561 Pain in right knee: Secondary | ICD-10-CM | POA: Diagnosis not present

## 2022-12-08 NOTE — Progress Notes (Signed)
The patient is a 67 year old gentleman who I am seeing for the first time as a patient but I took care of his wife for quite a bit.  He did let me know that she passed away this past 2023/09/17.  He is melancholy as a result of that.  He has been dealing with bilateral knee pain with the right worse than left for at least 3 to 4 months now.  He hurts with deep flexion of the right knee and has posterior lateral knee pain.  Has been having locking and catching as well.  He appropriately saw his primary care physician who did place a steroid injection in his right knee 2 months ago.  That has not really helped.  He has tried lidocaine patches and salon patches.  He does use a knee brace when walking.  He has tried anti-inflammatories.  He is at the point where it is detrimentally affecting his mobility and his quality of life.  He has had no other acute change in his medical status.  He does walk with a slight limp favoring his right knee.  The knee shows no effusion on the right side and his range of motion is full but past 90 degrees of flexion he gets a lot of posterior medial knee pain with locking catching and a positive McMurray's exam.  His Lachman's is negative.  He does have slight varus malalignment that is easily correctable.  2 views of the right knee show just slight medial joint space narrowing which is only slight and there is some mild patellofemoral arthritic changes.  There is no acute findings.  Given his continued locking catching and the failure conservative treatment for the right knee, I would like to obtain an MRI of the right knee to rule out a meniscal tear based on my clinical exam findings as well.  This will also be helpful in assessing the cartilage of his knee so that we can move forward with some type of treatment plan.  In the interim I want him to continue quad training exercises and being careful with his knee with pivoting activities.  He agrees with this treatment plan.  All  questions and concerns were answered and addressed.

## 2022-12-10 ENCOUNTER — Ambulatory Visit (HOSPITAL_COMMUNITY): Payer: Medicare Other

## 2022-12-10 ENCOUNTER — Encounter (HOSPITAL_BASED_OUTPATIENT_CLINIC_OR_DEPARTMENT_OTHER)
Admission: RE | Disposition: A | Discharge status: Home / Self Care | Payer: Self-pay | Source: Home / Self Care | Attending: Urology

## 2022-12-10 ENCOUNTER — Ambulatory Visit (HOSPITAL_BASED_OUTPATIENT_CLINIC_OR_DEPARTMENT_OTHER)
Admission: RE | Admit: 2022-12-10 | Discharge: 2022-12-10 | Disposition: A | Discharge status: Home / Self Care | Payer: Medicare Other | Attending: Urology | Admitting: Urology

## 2022-12-10 ENCOUNTER — Encounter (HOSPITAL_BASED_OUTPATIENT_CLINIC_OR_DEPARTMENT_OTHER): Payer: Self-pay | Admitting: Urology

## 2022-12-10 ENCOUNTER — Other Ambulatory Visit: Payer: Self-pay

## 2022-12-10 DIAGNOSIS — N200 Calculus of kidney: Secondary | ICD-10-CM | POA: Insufficient documentation

## 2022-12-10 HISTORY — PX: EXTRACORPOREAL SHOCK WAVE LITHOTRIPSY: SHX1557

## 2022-12-10 SURGERY — LITHOTRIPSY, ESWL
Anesthesia: LOCAL | Laterality: Right

## 2022-12-10 MED ORDER — DIAZEPAM 5 MG PO TABS
ORAL_TABLET | ORAL | Status: AC
  Filled 2022-12-10: qty 2

## 2022-12-10 MED ORDER — OXYCODONE-ACETAMINOPHEN 5-325 MG PO TABS: 1.0000 | ORAL_TABLET | ORAL | 0 refills | Status: DC | PRN

## 2022-12-10 MED ORDER — CIPROFLOXACIN HCL 500 MG PO TABS
500.0000 mg | ORAL_TABLET | ORAL | Status: AC
  Administered 2022-12-10: 500 mg via ORAL

## 2022-12-10 MED ORDER — DIPHENHYDRAMINE HCL 25 MG PO CAPS
ORAL_CAPSULE | ORAL | Status: AC
  Filled 2022-12-10: qty 1

## 2022-12-10 MED ORDER — DIAZEPAM 5 MG PO TABS
10.0000 mg | ORAL_TABLET | ORAL | Status: AC
  Administered 2022-12-10: 10 mg via ORAL

## 2022-12-10 MED ORDER — SODIUM CHLORIDE 0.9 % IV SOLN: INTRAVENOUS | Status: DC

## 2022-12-10 MED ORDER — CIPROFLOXACIN HCL 500 MG PO TABS
ORAL_TABLET | ORAL | Status: AC
  Filled 2022-12-10: qty 1

## 2022-12-10 MED ORDER — DIPHENHYDRAMINE HCL 25 MG PO CAPS
25.0000 mg | ORAL_CAPSULE | ORAL | Status: AC
  Administered 2022-12-10: 25 mg via ORAL

## 2022-12-10 NOTE — H&P (Signed)
Urology Preoperative H&P   Chief Complaint: Right renal stone  History of Present Illness: Kevin Young is a 67 y.o. male with a right renal stone here for right ESWL.    Past Medical History:  Diagnosis Date   Allergic rhinitis    Anxiety and depression    Basal cell carcinoma 01/2022   Left arm--Derm referral   BPH (benign prostatic hyperplasia)    Nocturia is his primary symptom:  Takes Flomax   Diverticulitis large intestine 2015/16   Recurrent: GI MD [name].  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   Eustachian tube dysfunction    with recurrent "sinus issues"--ENT (Dr. [name]) recommended surgery for nasal turbinate reduction and septoplasty--as of 11/03/15)   GERD (gastroesophageal reflux disease)    Hepatic steatosis    History of adenomatous polyp of colon    History of kidney stones    Hyperlipidemia    Hypertension    Started med 03/2018   IBS (irritable bowel syndrome)    Inguinal pain, left    Chronic; [name] to do MRI pelvis w/out contrast 08/2017 but insurance denied it.   Nephrolithiasis 12/23/2014   38m nonobstructive R renal calc on CT abd/pelv done for diverticulitis. 04/2021 6 mm nonobstructing stone R kidney, slight R renal pelviectasis, no hydronephrosis.   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  Recall 07/2017 per Dr. MWatt Climes  COLONOSCOPY  08/24/2017   Diverticulosis from transverse colon to sigmoid.  Otherwise normal.  Repeat in 5-10 years for screening/Dr. MWatt Climes  ESOPHAGOGASTRODUODENOSCOPY     need specifics from pt   HERNIA REPAIR      multiple.  Right inguinal repair by Dr. BGeorganna Skeansin 2004.  07/2017 having some muscular/nerve pain in groin--evaluated by Dr. TAlice Reichertw/u at that time.   INCISIONAL HERNIA REPAIR N/A 04/22/2020   Procedure: HERNIA REPAIR INCISIONAL WITH MESH;  Surgeon: TGeorganna Skeans MD;  [facility]  Service: General;  Laterality: N/A;   LAPAROSCOPIC SIGMOID COLECTOMY N/A 09/15/2015   Procedure: LAPAROSCOPIC ASSISTED  SIGMOID COLECTOMY;  Surgeon: BGeorganna Skeans MD;  [facility]  Service: General;  Laterality: N/A;   SUPRA-UMBILICAL HERNIA N/A [id number]  Procedure: SUPRA-UMBILICAL HERNIA REPAIR WITH MESH;  Surgeon: TGeorganna Skeans MD;  [facility] [facility]  Service: General;  Laterality: N/A;   TONSILLECTOMY      Allergies: No Known Allergies  Family History  Problem Relation Age of Onset   GI problems Mother    Cancer Father        skin   Hypertension Father    Glaucoma Father    Migraines Sister    Seizures Brother    Depression Brother     Social History:  reports that he quit smoking about 13 years ago. His smoking use included cigars. He quit smokeless tobacco use about 9 years ago.  His smokeless tobacco use included chew. He reports that he does not currently use alcohol. He reports that he does not use drugs.  ROS: A complete review of systems was performed.  All systems are negative except for pertinent findings as noted.  Physical Exam:  Vital signs in last 24 hours: Temp:  [97.7 F (36.5 C)] 97.7 F (36.5 C) (03/08 0729) Pulse Rate:  [71] 71 (03/08 0729) Resp:  [17] 17 (03/08 0729) BP: (147)/(84) 147/84 (03/08 0729) SpO2:  [99 %] 99 % (03/08 0729) Weight:  [107.8 kg] 107.8 kg (03/08 0729) Constitutional:  Alert and oriented, No acute distress Cardiovascular: Regular rate and rhythm Respiratory: Normal respiratory effort, Lungs clear bilaterally GI: Abdomen is soft, nontender, nondistended, no abdominal masses GU: No CVA tenderness Lymphatic: No  lymphadenopathy Neurologic: Grossly intact, no focal deficits Psychiatric: Normal mood and affect  Laboratory Data:  No results for input(s): "WBC", "HGB", "HCT", "PLT" in the last 72 hours.  No results for input(s): "NA", "K", "CL", "GLUCOSE", "BUN", "CALCIUM", "CREATININE" in the last 72 hours.  Invalid input(s): "CO3"   No results found for this or any previous visit (from the past 24 hour(s)). No results found for this or any previous visit (from the past 240 hour(s)).  Renal Function: No results for input(s): "CREATININE" in the last 168 hours. CrCl cannot be calculated (Patient's most recent lab result is older than the maximum 21 days allowed.).  Radiologic Imaging: No results found.  I independently reviewed the above imaging studies.  Assessment and Plan Kevin Young is a 68 y.o. male with a right renal stone here for right ESWL.  The risks, benefits and alternatives of a right ESWL was discussed with the patient. I described the risks which include arrhythmia, kidney contusion, kidney hemorrhage, need for transfusion, back discomfort, flank ecchymosis, flank abrasion, inability to fracture the stone, inability to pass stone fragments, Steinstrasse, infection associated with obstructing stones, need for an alternative surgical procedure and possible need for repeat shockwave lithotripsy.  The patient voices understanding and wishes to proceed.      Matt R. Heidee Audi MD 12/10/2022, 8:51 AM  [facility] Pager: [phone]): [phone]

## 2022-12-10 NOTE — Discharge Instructions (Signed)

## 2022-12-10 NOTE — Op Note (Signed)
[facility] Operative Note ? ?Treating Physician: Samera Macy, MD ? ?Pre-op diagnosis: Right renal stone ? ?Post-op diagnosis: Same  ? ?Procedure: Right ESWL ? ?See [facility] OP note scanned into chart. Also because of the size, density, location and other factors that cannot be anticipated I feel this will likely be a staged procedure. This fact supersedes any indication in the scanned [facility] operative note to the contrary. ? ?Matt R. Leoma Folds MD ?[facility]  ?Pager: [phone] ?  ?

## 2022-12-12 ENCOUNTER — Encounter: Payer: Self-pay | Admitting: Family Medicine

## 2022-12-13 ENCOUNTER — Encounter (HOSPITAL_BASED_OUTPATIENT_CLINIC_OR_DEPARTMENT_OTHER): Payer: Self-pay | Admitting: Urology

## 2022-12-13 ENCOUNTER — Ambulatory Visit
Admission: RE | Admit: 2022-12-13 | Discharge: 2022-12-13 | Disposition: A | Discharge status: Home / Self Care | Payer: Medicare Other | Source: Ambulatory Visit | Attending: Orthopaedic Surgery | Admitting: Orthopaedic Surgery

## 2022-12-13 DIAGNOSIS — S83241A Other tear of medial meniscus, current injury, right knee, initial encounter: Secondary | ICD-10-CM | POA: Diagnosis not present

## 2022-12-13 DIAGNOSIS — M7121 Synovial cyst of popliteal space [Baker], right knee: Secondary | ICD-10-CM | POA: Diagnosis not present

## 2022-12-13 DIAGNOSIS — G8929 Other chronic pain: Secondary | ICD-10-CM

## 2022-12-18 ENCOUNTER — Other Ambulatory Visit: Payer: Medicare Other

## 2022-12-23 ENCOUNTER — Ambulatory Visit (INDEPENDENT_AMBULATORY_CARE_PROVIDER_SITE_OTHER): Payer: Medicare Other | Admitting: Orthopaedic Surgery

## 2022-12-23 ENCOUNTER — Encounter: Payer: Self-pay | Admitting: Orthopaedic Surgery

## 2022-12-23 DIAGNOSIS — G8929 Other chronic pain: Secondary | ICD-10-CM | POA: Diagnosis not present

## 2022-12-23 DIAGNOSIS — S83241D Other tear of medial meniscus, current injury, right knee, subsequent encounter: Secondary | ICD-10-CM

## 2022-12-23 DIAGNOSIS — M25561 Pain in right knee: Secondary | ICD-10-CM

## 2022-12-23 NOTE — Progress Notes (Signed)
The patient is a 67 year old gentleman who returns today for follow-up after having an MRI of his right knee.  He continues to have medial joint line tenderness with locking and catching with pivoting activities.  His x-rays showed actually well-maintained joint space.  He had tried conservative treatment and failed this.  This included steroid injections as well as activity modification and anti-inflammatories.  Examination of his right knee again shows a positive McMurray's exam to the medial compartment the knee.  There is medial joint line tenderness with locking and catching.  The MRI of his right knee does show an oblique tear of the medial meniscus of the weightbearing surface.  There is a Baker's cyst associated with this and thinning of the articular cartilage but no full-thickness cartilage loss.  At this point I did show him a knee model and have recommended arthroscopic surgery for his right knee given his symptomatic medial meniscal tear with mechanical symptoms.  I explained what the surgery involves in detail and discussed the risks and benefits of this type of surgery.  Given his continued symptoms and the very conservative treatment combined with the MRI findings he does wish to proceed with the surgery as well.  We will work on getting this scheduled and be in touch.  All questions and concerns were addressed and answered.

## 2022-12-30 DIAGNOSIS — N200 Calculus of kidney: Secondary | ICD-10-CM | POA: Diagnosis not present

## 2023-01-07 ENCOUNTER — Telehealth: Payer: Self-pay | Admitting: Orthopaedic Surgery

## 2023-01-07 NOTE — Telephone Encounter (Signed)
Patient asking about his right knee surgery. He hasn't received a call. Please advise

## 2023-01-07 NOTE — Telephone Encounter (Signed)
I called patient and scheduled surgery. 

## 2023-01-18 NOTE — Patient Instructions (Signed)
Health Maintenance, Male Adopting a healthy lifestyle and getting preventive care are important in promoting health and wellness. Ask your health care provider about: The right schedule for you to have regular tests and exams. Things you can do on your own to prevent diseases and keep yourself healthy. What should I know about diet, weight, and exercise? Eat a healthy diet  Eat a diet that includes plenty of vegetables, fruits, low-fat dairy products, and lean protein. Do not eat a lot of foods that are high in solid fats, added sugars, or sodium. Maintain a healthy weight Body mass index (BMI) is a measurement that can be used to identify possible weight problems. It estimates body fat based on height and weight. Your health care provider can help determine your BMI and help you achieve or maintain a healthy weight. Get regular exercise Get regular exercise. This is one of the most important things you can do for your health. Most adults should: Exercise for at least 150 minutes each week. The exercise should increase your heart rate and make you sweat (moderate-intensity exercise). Do strengthening exercises at least twice a week. This is in addition to the moderate-intensity exercise. Spend less time sitting. Even light physical activity can be beneficial. Watch cholesterol and blood lipids Have your blood tested for lipids and cholesterol at 66 years of age, then have this test every 5 years. You may need to have your cholesterol levels checked more often if: Your lipid or cholesterol levels are high. You are older than 66 years of age. You are at high risk for heart disease. What should I know about cancer screening? Many types of cancers can be detected early and may often be prevented. Depending on your health history and family history, you may need to have cancer screening at various ages. This may include screening for: Colorectal cancer. Prostate cancer. Skin cancer. Lung  cancer. What should I know about heart disease, diabetes, and high blood pressure? Blood pressure and heart disease High blood pressure causes heart disease and increases the risk of stroke. This is more likely to develop in people who have high blood pressure readings or are overweight. Talk with your health care provider about your target blood pressure readings. Have your blood pressure checked: Every 3-5 years if you are 18-39 years of age. Every year if you are 40 years old or older. If you are between the ages of 65 and 75 and are a current or former smoker, ask your health care provider if you should have a one-time screening for abdominal aortic aneurysm (AAA). Diabetes Have regular diabetes screenings. This checks your fasting blood sugar level. Have the screening done: Once every three years after age 45 if you are at a normal weight and have a low risk for diabetes. More often and at a younger age if you are overweight or have a high risk for diabetes. What should I know about preventing infection? Hepatitis B If you have a higher risk for hepatitis B, you should be screened for this virus. Talk with your health care provider to find out if you are at risk for hepatitis B infection. Hepatitis C Blood testing is recommended for: Everyone born from 1945 through 1965. Anyone with known risk factors for hepatitis C. Sexually transmitted infections (STIs) You should be screened each year for STIs, including gonorrhea and chlamydia, if: You are sexually active and are younger than 66 years of age. You are older than 66 years of age and your   health care provider tells you that you are at risk for this type of infection. Your sexual activity has changed since you were last screened, and you are at increased risk for chlamydia or gonorrhea. Ask your health care provider if you are at risk. Ask your health care provider about whether you are at high risk for HIV. Your health care provider  may recommend a prescription medicine to help prevent HIV infection. If you choose to take medicine to prevent HIV, you should first get tested for HIV. You should then be tested every 3 months for as long as you are taking the medicine. Follow these instructions at home: Alcohol use Do not drink alcohol if your health care provider tells you not to drink. If you drink alcohol: Limit how much you have to 0-2 drinks a day. Know how much alcohol is in your drink. In the [place] one drink equals one 12 oz bottle of beer (355 mL), one 5 oz glass of wine (148 mL), or one 1 oz glass of hard liquor (44 mL). Lifestyle Do not use any products that contain nicotine or tobacco. These products include cigarettes, chewing tobacco, and vaping devices, such as e-cigarettes. If you need help quitting, ask your health care provider. Do not use street drugs. Do not share needles. Ask your health care provider for help if you need support or information about quitting drugs. General instructions Schedule regular health, dental, and eye exams. Stay current with your vaccines. Tell your health care provider if: You often feel depressed. You have ever been abused or do not feel safe at home. Summary Adopting a healthy lifestyle and getting preventive care are important in promoting health and wellness. Follow your health care provider's instructions about healthy diet, exercising, and getting tested or screened for diseases. Follow your health care provider's instructions on monitoring your cholesterol and blood pressure. This information is not intended to replace advice given to you by your health care provider. Make sure you discuss any questions you have with your health care provider. Document Revised: 02/09/2021 Document Reviewed: 02/09/2021 Elsevier Patient Education  2023 [employer]  

## 2023-01-19 ENCOUNTER — Encounter: Payer: Self-pay | Admitting: Family Medicine

## 2023-01-19 ENCOUNTER — Ambulatory Visit (INDEPENDENT_AMBULATORY_CARE_PROVIDER_SITE_OTHER): Payer: Medicare Other | Admitting: Family Medicine

## 2023-01-19 VITALS — BP 116/74 | HR 69 | Temp 98.0°F | Ht 65.35 in | Wt 240.0 lb

## 2023-01-19 DIAGNOSIS — I10 Essential (primary) hypertension: Secondary | ICD-10-CM | POA: Diagnosis not present

## 2023-01-19 DIAGNOSIS — E7800 Pure hypercholesterolemia, unspecified: Secondary | ICD-10-CM | POA: Diagnosis not present

## 2023-01-19 DIAGNOSIS — Z0000 Encounter for general adult medical examination without abnormal findings: Secondary | ICD-10-CM | POA: Diagnosis not present

## 2023-01-19 DIAGNOSIS — R7303 Prediabetes: Secondary | ICD-10-CM

## 2023-01-19 DIAGNOSIS — Z125 Encounter for screening for malignant neoplasm of prostate: Secondary | ICD-10-CM

## 2023-01-19 LAB — COMPREHENSIVE METABOLIC PANEL
ALT: 32 U/L (ref 0–53)
AST: 26 U/L (ref 0–37)
Albumin: 4.3 g/dL (ref 3.5–5.2)
Alkaline Phosphatase: 54 U/L (ref 39–117)
BUN: 19 mg/dL (ref 6–23)
CO2: 25 mEq/L (ref 19–32)
Calcium: 9.2 mg/dL (ref 8.4–10.5)
Chloride: 104 mEq/L (ref 96–112)
Creatinine, Ser: 1.04 mg/dL (ref 0.40–1.50)
GFR: 75.25 mL/min (ref >60.00)
Glucose, Bld: 108 mg/dL — ABNORMAL HIGH (ref 70–99)
Potassium: 4.5 mEq/L (ref 3.5–5.1)
Sodium: 140 mEq/L (ref 135–145)
Total Bilirubin: 0.7 mg/dL (ref 0.2–1.2)
Total Protein: 6.8 g/dL (ref 6.0–8.3)

## 2023-01-19 LAB — CBC
HCT: 46.2 % (ref 39.0–52.0)
Hemoglobin: 15.5 g/dL (ref 13.0–17.0)
MCHC: 33.5 g/dL (ref 30.0–36.0)
MCV: 93 fl (ref 78.0–100.0)
Platelets: 187 10*3/uL (ref 150.0–400.0)
RBC: 4.97 Mil/uL (ref 4.22–5.81)
RDW: 13.5 % (ref 11.5–15.5)
WBC: 5.7 10*3/uL (ref 4.0–10.5)

## 2023-01-19 LAB — LIPID PANEL
Cholesterol: 150 mg/dL (ref 0–200)
HDL: 58.2 mg/dL (ref >39.00)
LDL Cholesterol: 76 mg/dL (ref 0–99)
NonHDL: 91.63
Total CHOL/HDL Ratio: 3
Triglycerides: 77 mg/dL (ref 0.0–149.0)
VLDL: 15.4 mg/dL (ref 0.0–40.0)

## 2023-01-19 LAB — HEMOGLOBIN A1C: Hgb A1c MFr Bld: 6 % (ref 4.6–6.5)

## 2023-01-19 MED ORDER — AMLODIPINE BESY-BENAZEPRIL HCL 10-20 MG PO CAPS: 1.0000 | ORAL_CAPSULE | Freq: Every day | ORAL | 1 refills | Status: DC

## 2023-01-19 MED ORDER — ATORVASTATIN CALCIUM 20 MG PO TABS: ORAL_TABLET | ORAL | 1 refills | Status: DC

## 2023-01-19 NOTE — Progress Notes (Signed)
Office Note 01/19/2023  CC:  Chief Complaint  Patient presents with   Annual Exam    Pt is fasting    HPI:  Patient is a 67 y.o. male who is here for annual health maintenance exam and 82-month follow-up hypertension, hyperlipidemia, and prediabetes..  Dr. Magnus Ivan plans to do R knee medial meniscus repair soon.  No home blood pressure or glucose monitoring. He is compliant with all his medications.  Past Medical History:  Diagnosis Date   Allergic rhinitis    Anxiety and depression    Basal cell carcinoma 01/2022   Left arm--Derm referral   BPH (benign prostatic hyperplasia)    Nocturia is his primary symptom:  Takes Flomax   Diverticulitis large intestine 2015/16   Recurrent: GI MD Dr. Ewing Schlein.  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   Eustachian tube dysfunction    with recurrent "sinus issues"--ENT (Dr. Pollyann Kennedy) recommended surgery for nasal turbinate reduction and septoplasty--as of 11/03/15)   GERD (gastroesophageal reflux disease)    Hepatic steatosis    History of adenomatous polyp of colon    History of kidney stones    Hyperlipidemia    Hypertension    Started med 03/2018   IBS (irritable bowel syndrome)    Inguinal pain, left    Chronic; Dr. [name] to do MRI pelvis w/out contrast 08/2017 but insurance denied it.   Nephrolithiasis 12/23/2014   4mm nonobstructive R renal calc on CT abd/pelv done for diverticulitis. 04/2021 6 mm nonobstructing stone R kidney, slight R renal pelviectasis, no hydronephrosis.   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  Recall 07/2017 per  Dr. Ewing Schlein   COLONOSCOPY  08/24/2017   Diverticulosis from transverse colon to sigmoid.  Otherwise normal.  Repeat in 5-10 years for screening/Dr. Ewing Schlein   ESOPHAGOGASTRODUODENOSCOPY     need specifics from pt   EXTRACORPOREAL SHOCK WAVE LITHOTRIPSY Right 12/10/2022   EXTRACORPOREAL SHOCK WAVE LITHOTRIPSY Right 12/10/2022   Procedure: RIGHT EXTRACORPOREAL SHOCK WAVE LITHOTRIPSY (ESWL);  Surgeon: Jannifer Hick, MD;  [facility];  Service: Urology;  Laterality: Right;  75 MINUTES NEEDED FOR CASE   HERNIA REPAIR     multiple.  Right inguinal repair by Dr. Violeta Gelinas in 2004.  07/2017 having some muscular/nerve pain in groin--evaluated by Dr. Clarisse Gouge w/u at that time.   INCISIONAL HERNIA REPAIR N/A 04/22/2020   Procedure: HERNIA REPAIR INCISIONAL WITH MESH;  Surgeon: Violeta Gelinas, MD;  Location: [facility] OR;  Service: General;  Laterality: N/A;   LAPAROSCOPIC SIGMOID COLECTOMY N/A 09/15/2015   Procedure: LAPAROSCOPIC ASSISTED  SIGMOID COLECTOMY;  Surgeon: Violeta Gelinas, MD;  Location: [facility] OR;  Service: General;  Laterality: N/A;   SUPRA-UMBILICAL HERNIA N/A 07/14/2021   Procedure: SUPRA-UMBILICAL HERNIA REPAIR WITH MESH;  Surgeon: Violeta Gelinas, MD;  Location: [facility] OR;  Service: General;  Laterality: N/A;   TONSILLECTOMY      Family History  Problem Relation Age of Onset   GI problems Mother    Cancer Father  skin   Hypertension Father    Glaucoma Father    Migraines Sister    Seizures Brother    Depression Brother     Social History   Socioeconomic History   Marital status: Widowed    Spouse name: Not on file   Number of children: Not on file   Years of education: Not on file   Highest education level: Some college, no degree  Occupational History   Occupation: truck Air traffic controller: [employer]  Tobacco Use   Smoking status: Former    Types: Cigars    Quit date: 12/02/2009    Years since quitting: 13.1   Smokeless tobacco:  Former    Types: Chew    Quit date: 08/23/2013  Vaping Use   Vaping Use: Never used  Substance and Sexual Activity   Alcohol use: Not Currently    Comment: 1-2 beers a week   Drug use: No   Sexual activity: Not on file  Other Topics Concern   Not on file  Social History Narrative   Widower 09/2022, has step children.   Orig from [facility].   Occupation: delivery driver.   Former Contractor tob user, quit 2010.   Alcohol: 1 beer a day.   Social Determinants of Health   Financial Resource Strain: Low Risk  (11/23/2022)   Overall Financial Resource Strain (CARDIA)    Difficulty of Paying Living Expenses: Not very hard  Food Insecurity: No Food Insecurity (11/23/2022)   Hunger Vital Sign    Worried About Running Out of Food in the Last Year: Never true    Ran Out of Food in the Last Year: Never true  Transportation Needs: No Transportation Needs (11/23/2022)   PRAPARE - Administrator, Civil Service (Medical): No    Lack of Transportation (Non-Medical): No  Physical Activity: Insufficiently Active (11/23/2022)   Exercise Vital Sign    Days of Exercise per Week: 6 days    Minutes of Exercise per Session: 20 min  Stress: Stress Concern Present (11/23/2022)   [employer] of Occupational Health - Occupational Stress Questionnaire    Feeling of Stress : To some extent  Social Connections: Moderately Isolated (11/23/2022)   Social Connection and Isolation Panel [NHANES]    Frequency of Communication with Friends and Family: Three times a week    Frequency of Social Gatherings with Friends and Family: Once a week    Attends Religious Services: More than 4 times per year    Active Member of [employer] or Organizations: No    Attends Banker Meetings: Never    Marital Status: Widowed  Intimate Partner Violence: Not on file    Outpatient Medications Prior to Visit  Medication Sig Dispense Refill   acetaminophen (TYLENOL) 650 MG CR tablet Take 650 mg by  mouth daily.     cetirizine (ZYRTEC) 10 MG tablet Take 10 mg by mouth daily.     Glucosamine-Chondroitin 750-600 MG TABS Take 1 tablet by mouth 3 (three) times daily.     Lidocaine 4 % PTCH Apply 1 patch topically daily as needed (pain).     Magnesium 500 MG TABS Take 1 tablet by mouth at bedtime.     Melatonin 10 MG TABS Take 10 mg by mouth at bedtime.      Multiple Vitamins-Minerals (MULTIVITAMIN WITH MINERALS) tablet Take 1 tablet by mouth daily.     Omega-3 Fatty Acids (FISH OIL) 1200 MG CAPS Take 1,200-2,400 mg by  mouth See admin instructions. Take 2400 mg in the morning and 1200 mg in the evening     oxyCODONE-acetaminophen (PERCOCET) 5-325 MG tablet Take 1 tablet by mouth every 4 (four) hours as needed for up to 18 doses for severe pain. 18 tablet 0   oxymetazoline (AFRIN) 0.05 % nasal spray Place 1 spray into both nostrils at bedtime as needed for congestion.     Polyethylene Glycol 3350 (MIRALAX PO) Take by mouth as needed.     Probiotic Product (ALIGN PO) Take by mouth every morning.     Psyllium (METAMUCIL PO)      tamsulosin (FLOMAX) 0.4 MG CAPS capsule Take 0.4 mg by mouth at bedtime.     amLODipine-benazepril (LOTREL) 10-20 MG capsule TAKE 1 CAPSULE BY MOUTH DAILY 90 capsule 1   atorvastatin (LIPITOR) 20 MG tablet TAKE 1/2 TABLET(10 MG) BY MOUTH DAILY 45 tablet 1   No facility-administered medications prior to visit.    No Known Allergies  Review of Systems  Constitutional:  Negative for appetite change, chills, fatigue and fever.  HENT:  Negative for congestion, dental problem, ear pain and sore throat.   Eyes:  Negative for discharge, redness and visual disturbance.  Respiratory:  Negative for cough, chest tightness, shortness of breath and wheezing.   Cardiovascular:  Negative for chest pain, palpitations and leg swelling.  Gastrointestinal:  Negative for abdominal pain, blood in stool, diarrhea, nausea and vomiting.  Genitourinary:  Negative for difficulty urinating,  dysuria, flank pain, frequency, hematuria and urgency.  Musculoskeletal:  Negative for arthralgias, back pain, joint swelling, myalgias and neck stiffness.  Skin:  Negative for pallor and rash.  Neurological:  Negative for dizziness, speech difficulty, weakness and headaches.  Hematological:  Negative for adenopathy. Does not bruise/bleed easily.  Psychiatric/Behavioral:  Negative for confusion and sleep disturbance. The patient is not nervous/anxious.     PE;    01/19/2023    8:02 AM 12/10/2022   10:00 AM 12/10/2022    7:29 AM  Vitals with BMI  Height 5' 5.354"    Weight 240 lbs  237 lbs 11 oz  BMI 39.51  39.56  Systolic 116 121 960  Diastolic 74 73 84  Pulse 69 57 71   Gen: Alert, well appearing.  Patient is oriented to person, place, time, and situation. AFFECT: pleasant, lucid thought and speech. ENT: Ears: EACs clear, normal epithelium.  TMs with good light reflex and landmarks bilaterally.  Eyes: no injection, icteris, swelling, or exudate.  EOMI, PERRLA. Nose: no drainage or turbinate edema/swelling.  No injection or focal lesion.  Mouth: lips without lesion/swelling.  Oral mucosa pink and moist.  Dentition intact and without obvious caries or gingival swelling.  Oropharynx without erythema, exudate, or swelling.  Neck: supple/nontender.  No LAD, mass, or TM.  Carotid pulses 2+ bilaterally, without bruits. CV: RRR, no m/r/g.   LUNGS: CTA bilat, nonlabored resps, good aeration in all lung fields. ABD: soft, NT, ND, BS normal.  No hepatospenomegaly or mass.  No bruits. EXT: no clubbing, cyanosis, or edema.  Musculoskeletal: no joint swelling, erythema, warmth, or tenderness.  ROM of all joints intact. Skin - no sores or suspicious lesions or rashes or color changes  Pertinent labs:  Lab Results  Component Value Date   TSH 1.40 03/15/2018   Lab Results  Component Value Date   WBC 12.4 (H) 02/18/2022   HGB 14.4 02/18/2022   HCT 43.1 02/18/2022   MCV 88.7 02/18/2022    PLT 220  02/18/2022   Lab Results  Component Value Date   CREATININE 1.17 07/19/2022   BUN 17 07/19/2022   NA 140 07/19/2022   K 4.7 07/19/2022   CL 102 07/19/2022   CO2 27 07/19/2022   Lab Results  Component Value Date   ALT 39 07/19/2022   AST 29 07/19/2022   ALKPHOS 62 07/19/2022   BILITOT 0.8 07/19/2022   Lab Results  Component Value Date   CHOL 151 07/19/2022   Lab Results  Component Value Date   HDL 64.00 07/19/2022   Lab Results  Component Value Date   LDLCALC 70 07/19/2022   Lab Results  Component Value Date   TRIG 84.0 07/19/2022   Lab Results  Component Value Date   CHOLHDL 2 07/19/2022   Lab Results  Component Value Date   PSA 0.86 12/02/2021   PSA 0.65 11/17/2020   PSA 0.51 05/09/2019   Lab Results  Component Value Date   HGBA1C 5.6 07/19/2022   HGBA1C 5.6 07/19/2022   HGBA1C 5.6 (A) 07/19/2022   HGBA1C 5.6 07/19/2022   ASSESSMENT AND PLAN:   #1 health maintenance exam: Reviewed age and gender appropriate health maintenance issues (prudent diet, regular exercise, health risks of tobacco and excessive alcohol, use of seatbelts, fire alarms in home, use of sunscreen).  Also reviewed age and gender appropriate health screening as well as vaccine recommendations. Vaccines: All UTD. Labs: cbc,cmet, flp, Hba1c. Prostate ca screening: Pt gets PSA monitoring annually through his urologist. Colon ca screening: next colonoscopy due 2023-2028 range (Dr. Barnett Abu pt today.  #2 hypertension, well-controlled on amlodipine/benazepril 10/20, 1/day. Electrolytes and creatinine today.  3.  Hypercholesterolemia, doing well on atorvastatin 20 mg tab, half per day. Lipid panel and hepatic panel today.  #4 prediabetes. Monitor fasting glucose and hemoglobin A1c today.  An After Visit Summary was printed and given to the patient.  FOLLOW UP:  Return in about 6 months (around 07/21/2023) for routine chronic illness f/u.  Signed:  Santiago Bumpers, MD            01/19/2023

## 2023-01-27 DIAGNOSIS — N281 Cyst of kidney, acquired: Secondary | ICD-10-CM | POA: Diagnosis not present

## 2023-02-03 ENCOUNTER — Other Ambulatory Visit: Payer: Self-pay | Admitting: Orthopaedic Surgery

## 2023-02-03 DIAGNOSIS — M94261 Chondromalacia, right knee: Secondary | ICD-10-CM | POA: Diagnosis not present

## 2023-02-03 DIAGNOSIS — G8918 Other acute postprocedural pain: Secondary | ICD-10-CM | POA: Diagnosis not present

## 2023-02-03 DIAGNOSIS — M948X6 Other specified disorders of cartilage, lower leg: Secondary | ICD-10-CM | POA: Diagnosis not present

## 2023-02-03 DIAGNOSIS — S83231A Complex tear of medial meniscus, current injury, right knee, initial encounter: Secondary | ICD-10-CM

## 2023-02-03 MED ORDER — HYDROCODONE-ACETAMINOPHEN 5-325 MG PO TABS: 1.0000 | ORAL_TABLET | Freq: Four times a day (QID) | ORAL | 0 refills | Status: DC | PRN

## 2023-02-10 ENCOUNTER — Telehealth: Payer: Self-pay

## 2023-02-10 ENCOUNTER — Ambulatory Visit (INDEPENDENT_AMBULATORY_CARE_PROVIDER_SITE_OTHER): Payer: Medicare Other | Admitting: Orthopaedic Surgery

## 2023-02-10 ENCOUNTER — Encounter: Payer: Self-pay | Admitting: Orthopaedic Surgery

## 2023-02-10 DIAGNOSIS — Z9889 Other specified postprocedural states: Secondary | ICD-10-CM

## 2023-02-10 NOTE — Progress Notes (Signed)
The patient is here today for his first postoperative visit status post a right knee arthroscopy for partial medial meniscectomy.  We did find grade IV chondromalacia with an area of full-thickness cartilage loss on the most medial aspect of his medial tibial plateau.  There was also some in the trochlear groove toward the bottom aspect of the trochlea with ACL.  His ACL and PCL were intact and the lateral compartment showed no significant wear at all.  He is reporting some locking and catching and popping in his knee since surgery and some stiffness with pain worst being at night.  Examination of his right operative knee today shows that the sutures been removed.  He does have a mild effusion of that knee.  I did show him his arthroscopy pictures and we talked about the possibility of hyaluronic acid in the near future which I think is appropriate given the arthritis worsening on that medial compartment of his knee.  I did aspirate about 25 cc of fluid from his knee and placed a steroid in it today.  Given the osteoarthritis of his right knee medial compartment he is a good candidate for hyaluronic acid with so we will see if we get that ordered for his right knee to treat the pain from osteoarthritis.  Will see him back in a month.

## 2023-02-10 NOTE — Telephone Encounter (Signed)
Auth needed for right knee gel  

## 2023-02-11 NOTE — Telephone Encounter (Signed)
VOB submitted for Durolane, right knee.  

## 2023-03-14 ENCOUNTER — Other Ambulatory Visit: Payer: Self-pay

## 2023-03-14 ENCOUNTER — Encounter: Payer: Self-pay | Admitting: Orthopaedic Surgery

## 2023-03-14 ENCOUNTER — Ambulatory Visit (INDEPENDENT_AMBULATORY_CARE_PROVIDER_SITE_OTHER): Payer: Medicare Other | Admitting: Orthopaedic Surgery

## 2023-03-14 DIAGNOSIS — Z9889 Other specified postprocedural states: Secondary | ICD-10-CM

## 2023-03-14 DIAGNOSIS — G8929 Other chronic pain: Secondary | ICD-10-CM

## 2023-03-14 DIAGNOSIS — M1711 Unilateral primary osteoarthritis, right knee: Secondary | ICD-10-CM | POA: Diagnosis not present

## 2023-03-14 MED ORDER — SODIUM HYALURONATE 60 MG/3ML IX PRSY
60.0000 mg | PREFILLED_SYRINGE | INTRA_ARTICULAR | Status: AC | PRN
  Administered 2023-03-14: 60 mg via INTRA_ARTICULAR

## 2023-03-14 NOTE — Progress Notes (Signed)
The patient is now 6 weeks status post a right knee arthroscopy.  He is 67 years old.  We did find some there is a full-thickness cartilage loss in the far medial aspect of his tibial plateau.  He is here today for a supplement injection with hyaluronic acid in his right knee to treat the pain from osteoarthritis.  He says he is doing well otherwise.  He still has some popping and catching on occasion and does walk 6 days a week for exercise.  His right knee shows slight varus malalignment.  There is just a slight effusion.  I did place Durolane hyaluronic acid injection in his right knee today without difficulty.  Will see him back in 2 months to see if this is helped.  All questions and concerns were answered and addressed.  He did tolerate the injection well.     Procedure Note  Patient: Kevin Young             Date of Birth: December 17, 1955           MRN: [id number]             Visit Date: 03/14/2023  Procedures: Visit Diagnoses:  1. Status post arthroscopic surgery of right knee   2. Unilateral primary osteoarthritis, right knee     Large Joint Inj: R knee on 03/14/2023 1:20 PM Indications: diagnostic evaluation and pain Details: 22 G 1.5 in needle, superolateral approach  Arthrogram: No  Medications: 60 mg Sodium Hyaluronate 60 MG/3ML Outcome: tolerated well, no immediate complications Procedure, treatment alternatives, risks and benefits explained, specific risks discussed. Consent was given by the patient. Immediately prior to procedure a time out was called to verify the correct patient, procedure, equipment, support staff and site/side marked as required. Patient was prepped and draped in the usual sterile fashion.     Lot number: [id number]

## 2023-03-16 ENCOUNTER — Other Ambulatory Visit: Payer: Self-pay

## 2023-03-16 NOTE — Telephone Encounter (Signed)
RF request for atorvastatin LOV: 01/19/23 Next ov: 07/21/23 Last written: 01/19/23 (45,1) 1/2 tab daily; 6 month supply

## 2023-04-14 ENCOUNTER — Telehealth: Payer: Self-pay | Admitting: Orthopaedic Surgery

## 2023-04-14 NOTE — Telephone Encounter (Signed)
Patient called advised he had the gel injection 03/14/2023 and his knee never stopped hurting. Patient said the pain in his knee is between a 7 and 8. Patient asked for a call back as soon as possible.  The number to contact patient is [phone]

## 2023-04-20 ENCOUNTER — Ambulatory Visit: Payer: Medicare Other | Admitting: Orthopaedic Surgery

## 2023-04-20 DIAGNOSIS — M1711 Unilateral primary osteoarthritis, right knee: Secondary | ICD-10-CM

## 2023-04-20 MED ORDER — LIDOCAINE HCL 1 % IJ SOLN
3.0000 mL | INTRAMUSCULAR | Status: AC | PRN
  Administered 2023-04-20: 3 mL

## 2023-04-20 MED ORDER — METHYLPREDNISOLONE ACETATE 40 MG/ML IJ SUSP
40.0000 mg | INTRAMUSCULAR | Status: AC | PRN
  Administered 2023-04-20: 40 mg via INTRA_ARTICULAR

## 2023-04-20 NOTE — Progress Notes (Signed)
HPI: [name] comes in today requesting a cortisone injection right knee.  He is status post Durolane injection 03/14/2023.  States the injections just make his knee sore.  He has gained no benefit from the injection as of yet.  He denies any fevers chills.  He is nondiabetic.  Is wearing the Ace bandage and the knee brace on the right knee and finds this somewhat beneficial.  Physical exam: General well-developed well-nourished male in no acute distress.  Mood and affect appropriate. Right knee good range of motion.  Tenderness along medial joint line.  Passive range of motion reveals significant crepitus.  No abnormal warmth erythema or effusion    Impression: Right knee osteoarthritis  Plan: Per his request we did place steroid injection in the knee today.  He tolerated this well.  Did review with him that it can take up to 8 weeks for the supplemental injection to be beneficial.  See him back around August 12 for follow-up of the right knee.  Questions were encouraged and answered by Dr. Magnus Ivan and myself.       Procedure Note  Patient: Kevin Young             Date of Birth: Nov 12, 1955           MRN: [id number]             Visit Date: 04/20/2023  Procedures: Visit Diagnoses:  1. Unilateral primary osteoarthritis, right knee     Large Joint Inj: R knee on 04/20/2023 11:54 AM Indications: pain Details: 22 G 1.5 in needle, anterolateral approach  Arthrogram: No  Medications: 3 mL lidocaine 1 %; 40 mg methylPREDNISolone acetate 40 MG/ML Outcome: tolerated well, no immediate complications Procedure, treatment alternatives, risks and benefits explained, specific risks discussed. Consent was given by the patient. Immediately prior to procedure a time out was called to verify the correct patient, procedure, equipment, support staff and site/side marked as required. Patient was prepped and draped in the usual sterile fashion.

## 2023-05-16 ENCOUNTER — Encounter: Payer: Self-pay | Admitting: Orthopaedic Surgery

## 2023-05-16 ENCOUNTER — Ambulatory Visit: Payer: Medicare Other | Admitting: Orthopaedic Surgery

## 2023-05-16 DIAGNOSIS — M25561 Pain in right knee: Secondary | ICD-10-CM

## 2023-05-16 DIAGNOSIS — G8929 Other chronic pain: Secondary | ICD-10-CM

## 2023-05-16 DIAGNOSIS — M1711 Unilateral primary osteoarthritis, right knee: Secondary | ICD-10-CM

## 2023-05-16 NOTE — Progress Notes (Signed)
The patient is now making progress as it relates to his right knee.  He has known medial compartment arthritis of his knee.  He is active 67 year old gentleman.  We performed a right knee arthroscopy due to meniscal tear and we found some areas of full-thickness cartilage loss in the weightbearing surface of the medial compartment of his knee.  Since that surgery he has had a hyaluronic acid injection and just last month had a steroid injection the right knee.  He says he is feeling much better at this standpoint.  He still has some pain but he is walking better and for longer distances.  His right knee today shows slight varus malalignment but there is no effusion or warmth.  He has good range of motion of the knee and it is electively stable.  He knows to wait at least 3 months between steroid injections.  With that being said I would like to see him back in 3 months to see how he is doing overall but no x-rays are needed.

## 2023-07-20 NOTE — Patient Instructions (Signed)

## 2023-07-21 ENCOUNTER — Encounter: Payer: Self-pay | Admitting: Family Medicine

## 2023-07-21 ENCOUNTER — Ambulatory Visit (INDEPENDENT_AMBULATORY_CARE_PROVIDER_SITE_OTHER): Payer: Medicare Other | Admitting: Family Medicine

## 2023-07-21 VITALS — BP 126/83 | HR 88 | Wt 246.6 lb

## 2023-07-21 DIAGNOSIS — I10 Essential (primary) hypertension: Secondary | ICD-10-CM | POA: Diagnosis not present

## 2023-07-21 DIAGNOSIS — F4320 Adjustment disorder, unspecified: Secondary | ICD-10-CM

## 2023-07-21 DIAGNOSIS — F4321 Adjustment disorder with depressed mood: Secondary | ICD-10-CM

## 2023-07-21 DIAGNOSIS — R7303 Prediabetes: Secondary | ICD-10-CM | POA: Diagnosis not present

## 2023-07-21 DIAGNOSIS — E7800 Pure hypercholesterolemia, unspecified: Secondary | ICD-10-CM | POA: Diagnosis not present

## 2023-07-21 LAB — POCT GLYCOSYLATED HEMOGLOBIN (HGB A1C)
HbA1c POC (<> result, manual entry): 5.6 % (ref 4.0–5.6)
HbA1c, POC (controlled diabetic range): 5.6 % (ref 0.0–7.0)
HbA1c, POC (prediabetic range): 5.6 % — AB (ref 5.7–6.4)
Hemoglobin A1C: 5.6 % (ref 4.0–5.6)

## 2023-07-21 LAB — BASIC METABOLIC PANEL
BUN: 15 mg/dL (ref 6–23)
CO2: 31 meq/L (ref 19–32)
Calcium: 9.8 mg/dL (ref 8.4–10.5)
Chloride: 103 meq/L (ref 96–112)
Creatinine, Ser: 1.05 mg/dL (ref 0.40–1.50)
GFR: 74.13 mL/min (ref >60.00)
Glucose, Bld: 120 mg/dL — ABNORMAL HIGH (ref 70–99)
Potassium: 4.8 meq/L (ref 3.5–5.1)
Sodium: 141 meq/L (ref 135–145)

## 2023-07-21 LAB — LIPID PANEL
Cholesterol: 153 mg/dL (ref 0–200)
HDL: 55.9 mg/dL (ref >39.00)
LDL Cholesterol: 77 mg/dL (ref 0–99)
NonHDL: 97.58
Total CHOL/HDL Ratio: 3
Triglycerides: 102 mg/dL (ref 0.0–149.0)
VLDL: 20.4 mg/dL (ref 0.0–40.0)

## 2023-07-21 MED ORDER — AMLODIPINE BESY-BENAZEPRIL HCL 10-20 MG PO CAPS: 1.0000 | ORAL_CAPSULE | Freq: Every day | ORAL | 1 refills | Status: DC

## 2023-07-21 MED ORDER — ATORVASTATIN CALCIUM 20 MG PO TABS: ORAL_TABLET | ORAL | 1 refills | Status: DC

## 2023-07-21 NOTE — Progress Notes (Signed)
OFFICE VISIT  07/21/2023  CC:  Chief Complaint  Patient presents with   Medical Management of Chronic Issues    Pt is fasting.     Patient is a 67 y.o. male who presents for 37-month follow-up hypertension, hypercholesterolemia, and prediabetes. A/P as of last visit: "1 hypertension, well-controlled on amlodipine/benazepril 10/20, 1/day. Electrolytes and creatinine today.   2  Hypercholesterolemia, doing well on atorvastatin 20 mg tab, half per day. Lipid panel and hepatic panel today.   3 prediabetes. Monitor fasting glucose and hemoglobin A1c today."  INTERIM HX: Struggling with right knee pain.  He got arthroscopic right knee surgery but says it did not help any. He is also still struggling with grieving for his wife who died 11 months ago. He does not get out and do much other than take a walk each day. He admits his diet is not consistently healthy.  No home blood pressure monitoring.  ROS as above, plus--> both hips hurting him lately, left knee hurting him lately ---.>  All since his right knee is persistently been hurting.  He has altered gait and sometimes falls. No fevers, no CP, no SOB, no wheezing, no cough, no dizziness, no HAs, no rashes, no melena/hematochezia.  No polyuria or polydipsia.   No focal weakness, paresthesias, or tremors.  No acute vision or hearing abnormalities.  No dysuria or unusual/new urinary urgency or frequency.  No recent changes in lower legs. No n/v/d or abd pain.  No palpitations.      Past Medical History:  Diagnosis Date   Allergic rhinitis    Anxiety and depression    Basal cell carcinoma 01/2022   Left arm--Derm referral   BPH (benign prostatic hyperplasia)    Nocturia is his primary symptom:  Takes Flomax   Diverticulitis large intestine 2015/16   Recurrent: GI MD Dr. Ewing Schlein.  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   Eustachian tube dysfunction    with recurrent "sinus issues"--ENT  (Dr. Pollyann Kennedy) recommended surgery for nasal turbinate reduction and septoplasty--as of 11/03/15)   GERD (gastroesophageal reflux disease)    Hepatic steatosis    History of adenomatous polyp of colon    History of kidney stones    Hyperlipidemia    Hypertension    Started med 03/2018   IBS (irritable bowel syndrome)    Inguinal pain, left    Chronic; Dr. [name] to do MRI pelvis w/out contrast 08/2017 but insurance denied it.   Nephrolithiasis 12/23/2014   4mm nonobstructive R renal calc on CT abd/pelv done for diverticulitis. 04/2021 6 mm nonobstructing stone R kidney, slight R renal pelviectasis, no hydronephrosis.   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  Recall 07/2017 per Dr. Ewing Schlein   COLONOSCOPY  08/24/2017   Diverticulosis from transverse colon to sigmoid.  Otherwise normal.  Repeat in 5-10 years for screening/Dr. Ewing Schlein   ESOPHAGOGASTRODUODENOSCOPY     need specifics from pt   EXTRACORPOREAL SHOCK WAVE LITHOTRIPSY Right 12/10/2022   EXTRACORPOREAL SHOCK WAVE LITHOTRIPSY Right 12/10/2022  Procedure: RIGHT EXTRACORPOREAL SHOCK WAVE LITHOTRIPSY (ESWL);  Surgeon: Jannifer Hick, MD;  Location: [facility];  Service: Urology;  Laterality: Right;  75 MINUTES NEEDED FOR CASE   HERNIA REPAIR     multiple.  Right inguinal repair by Dr. Violeta Gelinas in 2004.  07/2017 having some muscular/nerve pain in groin--evaluated by Dr. Clarisse Gouge w/u at that time.   INCISIONAL HERNIA REPAIR N/A 04/22/2020   Procedure: HERNIA REPAIR INCISIONAL WITH MESH;  Surgeon: Violeta Gelinas, MD;  Location: [facility] OR;  Service: General;  Laterality:  N/A;   KNEE ARTHROSCOPY Right 2024   meniscectomy   LAPAROSCOPIC SIGMOID COLECTOMY N/A 09/15/2015   Procedure: LAPAROSCOPIC ASSISTED  SIGMOID COLECTOMY;  Surgeon: Violeta Gelinas, MD;  Location: [facility] OR;  Service: General;  Laterality: N/A;   SUPRA-UMBILICAL HERNIA N/A 07/14/2021   Procedure: SUPRA-UMBILICAL HERNIA REPAIR WITH MESH;  Surgeon: Violeta Gelinas, MD;  Location[facility];  Service: General;  Laterality: N/A;   TONSILLECTOMY      Outpatient Medications Prior to Visit  Medication Sig Dispense Refill   acetaminophen (TYLENOL) 650 MG CR tablet Take 650 mg by mouth daily.     amLODipine-benazepril (LOTREL) 10-20 MG capsule Take 1 capsule by mouth daily. 90 capsule 1   atorvastatin (LIPITOR) 20 MG tablet TAKE 1/2 TABLET(10 MG) BY MOUTH DAILY 45 tablet 1   cetirizine (ZYRTEC) 10 MG tablet Take 10 mg by mouth daily.     Glucosamine-Chondroitin 750-600 MG TABS Take 1 tablet by mouth 3 (three) times daily.     Lidocaine 4 % PTCH Apply 1 patch topically daily as needed (pain).     Magnesium 500 MG TABS Take 1 tablet by mouth at bedtime.     Melatonin 10 MG TABS Take 10 mg by mouth at bedtime.      Multiple Vitamins-Minerals (MULTIVITAMIN WITH MINERALS) tablet Take 1 tablet by mouth daily.     Omega-3 Fatty Acids (FISH OIL) 1200 MG CAPS Take 1,200-2,400 mg by mouth See admin instructions. Take 2400 mg in the morning and 1200 mg in the evening     oxymetazoline (AFRIN) 0.05 % nasal spray Place 1 spray into both nostrils at bedtime as needed for congestion.     Polyethylene Glycol 3350 (MIRALAX PO) Take by mouth as needed.     Probiotic Product (ALIGN PO) Take by mouth every morning.     Psyllium (METAMUCIL PO)      tamsulosin (FLOMAX) 0.4 MG CAPS capsule Take 0.4 mg by mouth at bedtime.     HYDROcodone-acetaminophen (NORCO/VICODIN) 5-325 MG tablet Take 1-2 tablets by mouth every 6 (six) hours as needed for moderate pain. 30 tablet 0   No facility-administered medications prior to visit.     No Known Allergies  Review of Systems As per HPI  PE:    07/21/2023    8:01 AM 01/19/2023    8:02 AM 12/10/2022   10:00 AM  Vitals with BMI  Height  5' 5.354"   Weight 246 lbs 10 oz 240 lbs   BMI  39.51   Systolic 126 116 161  Diastolic 83 74 73  Pulse 88 69 57     Physical Exam  Gen: Alert, well appearing.  Patient is oriented to person, place, time, and situation. AFFECT: pleasant but tearful at times, lucid thought and speech. CV: RRR, no m/r/g.   LUNGS: CTA bilat, nonlabored resps, good aeration in all lung fields. EXT: no clubbing or cyanosis.  no edema.    LABS:  Last CBC Lab Results  Component Value Date   WBC 5.7 01/19/2023   HGB 15.5 01/19/2023   HCT 46.2 01/19/2023   MCV 93.0 01/19/2023   MCH 29.6 02/18/2022   RDW 13.5 01/19/2023   PLT 187.0 01/19/2023   Last metabolic panel Lab Results  Component Value Date   GLUCOSE 108 (H) 01/19/2023   NA 140 01/19/2023   K 4.5 01/19/2023   CL 104 01/19/2023   CO2 25 01/19/2023   BUN 19 01/19/2023   CREATININE 1.04 01/19/2023   GFR 75.25 01/19/2023   CALCIUM 9.2 01/19/2023   PROT 6.8 01/19/2023   ALBUMIN 4.3 01/19/2023   BILITOT 0.7 01/19/2023   ALKPHOS 54 01/19/2023   AST 26 01/19/2023   ALT 32 01/19/2023   ANIONGAP 11 02/18/2022   Last lipids Lab Results  Component Value Date   CHOL 150 01/19/2023   HDL 58.20 01/19/2023   LDLCALC 76 01/19/2023   LDLDIRECT 171.0 11/18/2006   TRIG 77.0 01/19/2023   CHOLHDL 3 01/19/2023   Last hemoglobin A1c Lab Results  Component Value Date   HGBA1C 6.0 01/19/2023   Last thyroid functions Lab Results  Component Value Date   TSH 1.40 03/15/2018   IMPRESSION AND PLAN:  1 hypertension, well-controlled on amlodipine/benazepril 10/20, 1/day. Electrolytes and creatinine today.   2  Hypercholesterolemia, doing well on atorvastatin 20 mg tab, half per day. Lipid panel and hepatic panel today.   3 prediabetes. Hemoglobin A1c is 5.6% today. Continue  efforts at lifestyle changes.  #4 grief reaction.  He is gradually coming along but still struggling. Discussed possibility of medication at this time or in the near future and he will think about it and let me know. Empathic listening today.  An After Visit Summary was printed and given to the patient.  FOLLOW UP: Return in about 6 months (around 01/19/2024) for annual CPE (fasting).  Signed:  Santiago Bumpers, MD           07/21/2023

## 2023-07-22 ENCOUNTER — Other Ambulatory Visit: Payer: Self-pay | Admitting: Family Medicine

## 2023-07-26 ENCOUNTER — Other Ambulatory Visit: Payer: Self-pay

## 2023-07-26 ENCOUNTER — Encounter (HOSPITAL_BASED_OUTPATIENT_CLINIC_OR_DEPARTMENT_OTHER): Payer: Self-pay | Admitting: Emergency Medicine

## 2023-07-26 DIAGNOSIS — R1031 Right lower quadrant pain: Secondary | ICD-10-CM | POA: Insufficient documentation

## 2023-07-26 DIAGNOSIS — I10 Essential (primary) hypertension: Secondary | ICD-10-CM | POA: Insufficient documentation

## 2023-07-26 DIAGNOSIS — Z79899 Other long term (current) drug therapy: Secondary | ICD-10-CM | POA: Insufficient documentation

## 2023-07-26 DIAGNOSIS — R140 Abdominal distension (gaseous): Secondary | ICD-10-CM | POA: Diagnosis not present

## 2023-07-26 DIAGNOSIS — R109 Unspecified abdominal pain: Secondary | ICD-10-CM | POA: Diagnosis present

## 2023-07-26 DIAGNOSIS — R1084 Generalized abdominal pain: Secondary | ICD-10-CM | POA: Diagnosis not present

## 2023-07-26 LAB — URINALYSIS, ROUTINE W REFLEX MICROSCOPIC
Bilirubin Urine: NEGATIVE
Glucose, UA: NEGATIVE mg/dL
Hgb urine dipstick: NEGATIVE
Ketones, ur: NEGATIVE mg/dL
Leukocytes,Ua: NEGATIVE
Nitrite: NEGATIVE
Protein, ur: NEGATIVE mg/dL
Specific Gravity, Urine: 1.01 (ref 1.005–1.030)
pH: 6.5 (ref 5.0–8.0)

## 2023-07-26 LAB — COMPREHENSIVE METABOLIC PANEL
ALT: 30 U/L (ref 0–44)
AST: 27 U/L (ref 15–41)
Albumin: 4.8 g/dL (ref 3.5–5.0)
Alkaline Phosphatase: 58 U/L (ref 38–126)
Anion gap: 10 (ref 5–15)
BUN: 12 mg/dL (ref 8–23)
CO2: 26 mmol/L (ref 22–32)
Calcium: 10.1 mg/dL (ref 8.9–10.3)
Chloride: 102 mmol/L (ref 98–111)
Creatinine, Ser: 1.05 mg/dL (ref 0.61–1.24)
GFR, Estimated: >60 mL/min (ref >60)
Glucose, Bld: 105 mg/dL — ABNORMAL HIGH (ref 70–99)
Potassium: 4.1 mmol/L (ref 3.5–5.1)
Sodium: 138 mmol/L (ref 135–145)
Total Bilirubin: 0.7 mg/dL (ref 0.3–1.2)
Total Protein: 8 g/dL (ref 6.5–8.1)

## 2023-07-26 LAB — CBC
HCT: 45.6 % (ref 39.0–52.0)
Hemoglobin: 15.7 g/dL (ref 13.0–17.0)
MCH: 31.1 pg (ref 26.0–34.0)
MCHC: 34.4 g/dL (ref 30.0–36.0)
MCV: 90.3 fL (ref 80.0–100.0)
Platelets: 209 10*3/uL (ref 150–400)
RBC: 5.05 MIL/uL (ref 4.22–5.81)
RDW: 13.2 % (ref 11.5–15.5)
WBC: 9.9 10*3/uL (ref 4.0–10.5)
nRBC: 0 % (ref 0.0–0.2)

## 2023-07-26 LAB — LIPASE, BLOOD: Lipase: 25 U/L (ref 11–51)

## 2023-07-26 NOTE — ED Triage Notes (Signed)
Pt via pov from home with right sided abdominal pain that began yesterday. Denies any urinary symptoms. Pt states he feels bloated; denies constipation or diarrhea. Also denies n/v. Pt has hx of diverticulitis and kidney stones. Pt alert & oriented, nad noted.

## 2023-07-27 ENCOUNTER — Emergency Department (HOSPITAL_BASED_OUTPATIENT_CLINIC_OR_DEPARTMENT_OTHER): Payer: Medicare Other

## 2023-07-27 ENCOUNTER — Emergency Department (HOSPITAL_BASED_OUTPATIENT_CLINIC_OR_DEPARTMENT_OTHER)
Admission: EM | Admit: 2023-07-27 | Discharge: 2023-07-27 | Disposition: A | Discharge status: Home / Self Care | Payer: Medicare Other | Attending: Emergency Medicine | Admitting: Emergency Medicine

## 2023-07-27 DIAGNOSIS — N281 Cyst of kidney, acquired: Secondary | ICD-10-CM | POA: Diagnosis not present

## 2023-07-27 DIAGNOSIS — R109 Unspecified abdominal pain: Secondary | ICD-10-CM

## 2023-07-27 DIAGNOSIS — R1084 Generalized abdominal pain: Secondary | ICD-10-CM

## 2023-07-27 DIAGNOSIS — N200 Calculus of kidney: Secondary | ICD-10-CM | POA: Diagnosis not present

## 2023-07-27 DIAGNOSIS — R1031 Right lower quadrant pain: Secondary | ICD-10-CM | POA: Diagnosis not present

## 2023-07-27 MED ORDER — IOHEXOL 300 MG/ML  SOLN
100.0000 mL | Freq: Once | INTRAMUSCULAR | Status: AC | PRN
  Administered 2023-07-27: 100 mL via INTRAVENOUS

## 2023-07-27 MED ORDER — ONDANSETRON HCL 4 MG/2ML IJ SOLN
4.0000 mg | Freq: Once | INTRAMUSCULAR | Status: AC
  Administered 2023-07-27: 4 mg via INTRAVENOUS
  Filled 2023-07-27: qty 2

## 2023-07-27 MED ORDER — FENTANYL CITRATE PF 50 MCG/ML IJ SOSY
50.0000 ug | PREFILLED_SYRINGE | Freq: Once | INTRAMUSCULAR | Status: AC
  Administered 2023-07-27: 50 ug via INTRAVENOUS
  Filled 2023-07-27: qty 1

## 2023-07-27 NOTE — ED Provider Notes (Signed)
[facility] EMERGENCY DEPARTMENT AT [facility] Provider Note   CSN: [id number] Arrival date & time: 07/26/23  1955     History  Chief Complaint  Patient presents with   Abdominal Pain    SYMON STROHMEYER is a 67 y.o. male.   Abdominal Pain    67 year old male with medical history significant for nephrolithiasis, diverticulitis, BPH, anxiety, depression, obesity, HLD, HTN who presents to the emergency department with right-sided abdominal pain that has been ongoing for the last day.  He denies any urinary symptoms.  He feels bloated.  His last bowel movement was 1 day ago and he has had some decreased passage of gas but is still passing gas.  He has a history of diverticulitis and this feels similar although he previously had had pain in the left lower quadrant associated with his diverticulitis.  He denies any fevers or chills.  He denies any nausea or vomiting.  Home Medications Prior to Admission medications   Medication Sig Start Date End Date Taking? Authorizing Provider  acetaminophen (TYLENOL) 650 MG CR tablet Take 650 mg by mouth daily. 10/04/21   [provider]  amLODipine-benazepril (LOTREL) 10-20 MG capsule Take 1 capsule by mouth daily. 07/21/23   McGowen, Maryjean Morn, MD  atorvastatin (LIPITOR) 20 MG tablet TAKE 1/2 TABLET(10 MG) BY MOUTH DAILY 07/21/23   McGowen, Maryjean Morn, MD  cetirizine (ZYRTEC) 10 MG tablet Take 10 mg by mouth daily.    [provider]  Glucosamine-Chondroitin 750-600 MG TABS Take 1 tablet by mouth 3 (three) times daily.    [provider]  Lidocaine 4 % PTCH Apply 1 patch topically daily as needed (pain).    [provider]  Magnesium 500 MG TABS Take 1 tablet by mouth at bedtime.    [provider]  Melatonin 10 MG TABS Take 10 mg by mouth at bedtime.     [provider]  Multiple Vitamins-Minerals (MULTIVITAMIN WITH MINERALS) tablet Take 1 tablet by mouth daily.    [provider]  Omega-3 Fatty Acids (FISH OIL) 1200 MG CAPS Take 1,200-2,400 mg by mouth See admin instructions. Take 2400 mg in the morning and 1200 mg in the evening    [provider]  oxymetazoline (AFRIN) 0.05 % nasal spray Place 1 spray into both nostrils at bedtime as needed for congestion.    [provider]  Polyethylene Glycol 3350 (MIRALAX PO) Take by mouth as needed.    [provider]  Probiotic Product (ALIGN PO) Take by mouth every morning.    [provider]  Psyllium (METAMUCIL PO)  01/02/22   [provider]  tamsulosin (FLOMAX) 0.4 MG CAPS capsule Take 0.4 mg by mouth at bedtime.    [provider]      Allergies    Patient has no known allergies.    Review of Systems   Review of Systems  Gastrointestinal:  Positive for abdominal pain.  All other systems reviewed and are negative.   Physical Exam Updated Vital Signs BP 117/70   Pulse 82   Temp 97.9 F (36.6 C) (Oral)   Resp 18   Ht 5\' 5"  (1.651 m)   Wt 111.9 kg   SpO2 96%   BMI 41.05 kg/m  Physical Exam Vitals and nursing note reviewed.  Constitutional:      General: He is not in acute distress.    Appearance: He is well-developed.  HENT:     Head: Normocephalic and atraumatic.  Eyes:     Conjunctiva/sclera: Conjunctivae normal.  Cardiovascular:     Rate and Rhythm: Normal rate and regular rhythm.     Heart sounds: No murmur heard. Pulmonary:     Effort: Pulmonary effort is normal. No respiratory distress.     Breath sounds: Normal breath sounds.  Abdominal:     General: There is distension.     Palpations: Abdomen is soft.     Tenderness: There is abdominal tenderness in the right lower quadrant.  Musculoskeletal:        General: No swelling.     Cervical back: Neck supple.  Skin:    General: Skin is warm and dry.     Capillary Refill: Capillary refill takes less than 2 seconds.  Neurological:     Mental Status: He is alert.  Psychiatric:         Mood and Affect: Mood normal.     ED Results / Procedures / Treatments   Labs (all labs ordered are listed, but only abnormal results are displayed) Labs Reviewed  COMPREHENSIVE METABOLIC PANEL - Abnormal; Notable for the following components:      Result Value   Glucose, Bld 105 (*)    All other components within normal limits  LIPASE, BLOOD  CBC  URINALYSIS, ROUTINE W REFLEX MICROSCOPIC    EKG None  Radiology CT ABDOMEN PELVIS W CONTRAST  Result Date: 07/27/2023 CLINICAL DATA:  Diverticulitis with complication suspected. Right-sided abdominal pain beginning yesterday. Bloated feeling. EXAM: CT ABDOMEN AND PELVIS WITH CONTRAST TECHNIQUE: Multidetector CT imaging of the abdomen and pelvis was performed using the standard protocol following bolus administration of intravenous contrast. RADIATION DOSE REDUCTION: This exam was performed according to the departmental dose-optimization program which includes automated exposure control, adjustment of the mA and/or kV according to patient size and/or use of iterative reconstruction technique. CONTRAST:  OMNIPAQUE IOHEXOL 300 MG/ML  SOLN COMPARISON:  03/09/2022 FINDINGS: Lower chest: Lung bases are clear. Hepatobiliary: Mild diffuse fatty infiltration of the liver. No focal lesions. Gallbladder and bile ducts are normal. Pancreas: Unremarkable. No pancreatic ductal dilatation or surrounding inflammatory changes. Spleen: Normal in size without focal abnormality. Adrenals/Urinary Tract: No adrenal gland nodules. Bilateral renal parenchymal and parapelvic cysts. Largest parenchymal cyst on the left measures 2.4 cm diameter. No change since previous study. No imaging follow-up is indicated. 3 mm stone in the lower pole right kidney. No hydronephrosis or hydroureter. Bladder is normal. Stomach/Bowel: Stomach, small bowel, and colon are not abnormally distended. No wall thickening or inflammatory changes. Anastomosis at the descending sigmoid  colon junction. Vascular/Lymphatic: Aortic atherosclerosis. No enlarged abdominal or pelvic lymph nodes. Reproductive: Prostate is unremarkable. Other: No free air or free fluid in the abdomen. Scarring in the anterior abdominal wall is unchanged since prior study, likely postoperative. Musculoskeletal: No acute or significant osseous findings. IMPRESSION: 1. Mild diffuse fatty infiltration of the liver. 2. Nonobstructing right intrarenal stone. 3. Aortic atherosclerosis. 4. No evidence of bowel obstruction or inflammation. Electronically Signed   By: Burman Nieves M.D.   On: 07/27/2023 03:01    Procedures Procedures    Medications Ordered in ED Medications  fentaNYL (SUBLIMAZE) injection 50 mcg (50 mcg Intravenous Given 07/27/23 0137)  ondansetron (ZOFRAN) injection 4 mg (4 mg Intravenous Given 07/27/23 0135)  iohexol (OMNIPAQUE) 300 MG/ML solution 100 mL (100 mLs Intravenous Contrast Given 07/27/23 0120)    ED Course/ Medical Decision Making/ A&P  Medical Decision Making Amount and/or Complexity of Data Reviewed Labs: ordered. Radiology: ordered.  Risk Prescription drug management.    67 year old male with medical history significant for nephrolithiasis, diverticulitis, BPH, anxiety, depression, obesity, HLD, HTN who presents to the emergency department with right-sided abdominal pain that has been ongoing for the last day.  He denies any urinary symptoms.  He feels bloated.  His last bowel movement was 1 day ago and he has had some decreased passage of gas but is still passing gas.  He has a history of diverticulitis and this feels similar although he previously had had pain in the left lower quadrant associated with his diverticulitis.  He denies any fevers or chills.  He denies any nausea or vomiting.  On arrival, the patient was afebrile, not tachycardic or tachypneic, saturating well on room air.  Physical exam revealed mild generalized abdominal  tenderness with some abdominal distention.  Patient has a history of previous intra-abdominal surgery.  Lower suspicion for small bowel obstruction or other acute intra-abdominal emergency however given the patient's history of diverticulitis, will obtain CT imaging to further evaluate.  The access was obtained and the patient was administered IV fentanyl and IV Zofran.     Labs: Urinalysis negative for hematuria or UTI, CMP, lipase, CBC unremarkable.  CT abdomen pelvis: IMPRESSION:  1. Mild diffuse fatty infiltration of the liver.  2. Nonobstructing right intrarenal stone.  3. Aortic atherosclerosis.  4. No evidence of bowel obstruction or inflammation.   On repeat assessment, the patient was feeling symptomatically improved. He was notably tolerating oral intake and able to rehydrate orally.  I recommended the patient trial an increase in his home bowel regimen with increasing MiraLAX as his symptoms could be abdominal distention, bloating and spasm in the setting of mild constipation.  Will have the patient follow-up outpatient with his PCP and/your gastroenterologist, return precautions provided.  Stable for discharge.    Final Clinical Impression(s) / ED Diagnoses Final diagnoses:  Generalized abdominal pain  Abdominal bloating with cramps    Rx / DC Orders ED Discharge Orders     None         Ernie Avena, MD 07/27/23 [phone]

## 2023-07-27 NOTE — Discharge Instructions (Signed)
Your laboratory evaluation and CT imaging was overall reassuring.  Trial an increase in your home bowel regimen and follow-up with your PCP and/your gastroenterologist.

## 2023-08-15 ENCOUNTER — Ambulatory Visit: Payer: Medicare Other | Admitting: Orthopaedic Surgery

## 2023-08-17 ENCOUNTER — Ambulatory Visit (INDEPENDENT_AMBULATORY_CARE_PROVIDER_SITE_OTHER): Payer: Medicare Other | Admitting: Physician Assistant

## 2023-08-17 ENCOUNTER — Encounter: Payer: Self-pay | Admitting: Physician Assistant

## 2023-08-17 VITALS — Ht 66.0 in | Wt 244.0 lb

## 2023-08-17 DIAGNOSIS — M1711 Unilateral primary osteoarthritis, right knee: Secondary | ICD-10-CM

## 2023-08-17 NOTE — Progress Notes (Signed)
HPI: [name] returns today follow-up of his right knee.  He has known full-thickness cartilage loss medial compartment of his knee and grade 2-3 changes at the very inferior aspect of the trochlear groove.  These findings were seen on the knee arthroscopy that was performed 5-24.  His lateral compartment at that time was normal.  He did undergo a partial medial meniscectomy at that time due to a meniscal tear.  He has tried cortisone injections and gel injections and in fact feels that the gel injection made his knee pain worse.  He states that he has having knee give way on him more often now.  Despite the fact that he continues to use knee brace.  He is in constant pain at this point regards to the right knee which is moderate to severe.  He is asking about possible knee replacement be partial or total knee.  He does note that he has pain going up and down stairs. He is prediabetic with overall good control.  He denies any fevers chills.  He does state that he drinks approximately 3 shots of liquor per day.  No drug use.  Review of systems: See HPI otherwise negative  Physical exam:  Weight 244 pounds  Height 5 foot 6 inches  BMI 39.38 kg/m General Well-developed well-nourished male no acute distress mood affect appropriate. Respirations: Unlabored Right knee full extension flexion to approximately 115 degrees no instability valgus varus stressing.  No abnormal warmth erythema or effusion.  Patellofemoral crepitus passive range of motion.  Tenderness along medial joint line of the right knee.  Impression: Right knee osteoarthritis  Plan: Discussed with him total knee replacement versus Uni knee replacement.  Showed him models of both.  Discussed postoperative protocol.  Discussed risk benefits of surgery.  Risk include but are not limited to DVT/PE, wound healing problems, infection, blood loss, and nerve or vessel injury.  Will discuss patient with Dr. Magnus Ivan in the near future have  him contact [name] discuss surgery.  Did review arthroscopy images with the patient today.

## 2023-08-23 DIAGNOSIS — Z85828 Personal history of other malignant neoplasm of skin: Secondary | ICD-10-CM | POA: Diagnosis not present

## 2023-08-23 DIAGNOSIS — L814 Other melanin hyperpigmentation: Secondary | ICD-10-CM | POA: Diagnosis not present

## 2023-08-23 DIAGNOSIS — L821 Other seborrheic keratosis: Secondary | ICD-10-CM | POA: Diagnosis not present

## 2023-08-23 DIAGNOSIS — L738 Other specified follicular disorders: Secondary | ICD-10-CM | POA: Diagnosis not present

## 2023-08-23 DIAGNOSIS — L570 Actinic keratosis: Secondary | ICD-10-CM | POA: Diagnosis not present

## 2023-08-25 DIAGNOSIS — R109 Unspecified abdominal pain: Secondary | ICD-10-CM | POA: Diagnosis not present

## 2023-09-07 DIAGNOSIS — H2513 Age-related nuclear cataract, bilateral: Secondary | ICD-10-CM | POA: Diagnosis not present

## 2023-09-07 DIAGNOSIS — I10 Essential (primary) hypertension: Secondary | ICD-10-CM | POA: Diagnosis not present

## 2023-09-07 DIAGNOSIS — H524 Presbyopia: Secondary | ICD-10-CM | POA: Diagnosis not present

## 2023-09-07 DIAGNOSIS — E119 Type 2 diabetes mellitus without complications: Secondary | ICD-10-CM | POA: Diagnosis not present

## 2023-09-12 ENCOUNTER — Other Ambulatory Visit: Payer: Self-pay

## 2023-09-12 ENCOUNTER — Telehealth: Payer: Self-pay

## 2023-09-12 MED ORDER — ATORVASTATIN CALCIUM 20 MG PO TABS: ORAL_TABLET | ORAL | 1 refills | Status: DC

## 2023-09-12 NOTE — Telephone Encounter (Signed)
Rx sent 

## 2023-09-12 NOTE — Telephone Encounter (Signed)
Patient states that [facility] has sent mulitple requests and we have not responded. Please follow up with patient [phone].   Prescription Request  09/12/2023  LOV: Visit date not found  What is the name of the medication or equipment? atorvastatin (LIPITOR) 20 MG tablet   Have you contacted your pharmacy to request a refill? Yes   Which pharmacy would you like this sent to?  [id number] - [place], [place] - [address] [place] [address] Phone: [phone] Fax: [phone]    Patient notified that their request is being sent to the clinical staff for review and that they should receive a response within 2 business days.   Please advise at Mobile [phone] (mobile)

## 2023-10-11 ENCOUNTER — Other Ambulatory Visit: Payer: Self-pay

## 2023-10-20 ENCOUNTER — Other Ambulatory Visit: Payer: Self-pay

## 2023-10-20 ENCOUNTER — Encounter (HOSPITAL_COMMUNITY)
Admission: RE | Admit: 2023-10-20 | Discharge: 2023-10-20 | Disposition: A | Discharge status: Home / Self Care | Payer: Medicare Other | Source: Ambulatory Visit | Attending: Orthopaedic Surgery | Admitting: Orthopaedic Surgery

## 2023-10-20 ENCOUNTER — Encounter (HOSPITAL_COMMUNITY): Payer: Self-pay

## 2023-10-20 VITALS — BP 151/85 | HR 84 | Temp 97.9°F | Resp 17 | Ht 65.5 in | Wt 246.6 lb

## 2023-10-20 DIAGNOSIS — Z01810 Encounter for preprocedural cardiovascular examination: Secondary | ICD-10-CM | POA: Diagnosis present

## 2023-10-20 DIAGNOSIS — I4519 Other right bundle-branch block: Secondary | ICD-10-CM | POA: Insufficient documentation

## 2023-10-20 DIAGNOSIS — Z01818 Encounter for other preprocedural examination: Secondary | ICD-10-CM | POA: Insufficient documentation

## 2023-10-20 DIAGNOSIS — Z01812 Encounter for preprocedural laboratory examination: Secondary | ICD-10-CM | POA: Diagnosis present

## 2023-10-20 HISTORY — DX: Personal history of pneumonia (recurrent): Z87.01

## 2023-10-20 LAB — CBC
HCT: 47.4 % (ref 39.0–52.0)
Hemoglobin: 16.1 g/dL (ref 13.0–17.0)
MCH: 31.3 pg (ref 26.0–34.0)
MCHC: 34 g/dL (ref 30.0–36.0)
MCV: 92 fL (ref 80.0–100.0)
Platelets: 229 10*3/uL (ref 150–400)
RBC: 5.15 MIL/uL (ref 4.22–5.81)
RDW: 12.6 % (ref 11.5–15.5)
WBC: 7.6 10*3/uL (ref 4.0–10.5)
nRBC: 0 % (ref 0.0–0.2)

## 2023-10-20 LAB — COMPREHENSIVE METABOLIC PANEL
ALT: 31 U/L (ref 0–44)
AST: 27 U/L (ref 15–41)
Albumin: 3.7 g/dL (ref 3.5–5.0)
Alkaline Phosphatase: 51 U/L (ref 38–126)
Anion gap: 6 (ref 5–15)
BUN: 11 mg/dL (ref 8–23)
CO2: 28 mmol/L (ref 22–32)
Calcium: 9.7 mg/dL (ref 8.9–10.3)
Chloride: 106 mmol/L (ref 98–111)
Creatinine, Ser: 1.09 mg/dL (ref 0.61–1.24)
GFR, Estimated: >60 mL/min (ref >60)
Glucose, Bld: 122 mg/dL — ABNORMAL HIGH (ref 70–99)
Potassium: 4.2 mmol/L (ref 3.5–5.1)
Sodium: 140 mmol/L (ref 135–145)
Total Bilirubin: 0.7 mg/dL (ref 0.0–1.2)
Total Protein: 7.1 g/dL (ref 6.5–8.1)

## 2023-10-20 LAB — SURGICAL PCR SCREEN
MRSA, PCR: NEGATIVE
Staphylococcus aureus: POSITIVE — AB

## 2023-10-20 NOTE — Progress Notes (Signed)
Surgical Instructions   Your procedure is scheduled on Tuesday, October 25, 2023. Report to [facility] Main Entrance "A" at 8:30 A.M., then check in with the Admitting office. Any questions or running late day of surgery: call [phone]  Questions prior to your surgery date: call [phone], Monday-Friday, 8am-4pm. If you experience any cold or flu symptoms such as cough, fever, chills, shortness of breath, etc. between now and your scheduled surgery, please notify us at the above number.     Remember:  Do not eat after midnight the night before your surgery  You may drink clear liquids until 7:30 the morning of your surgery.   Clear liquids allowed are: Water, Non-Citrus Juices (without pulp), Carbonated Beverages, Clear Tea (no milk, honey, etc.), Black Coffee Only (NO MILK, CREAM OR POWDERED CREAMER of any kind), and Gatorade.  Patient Instructions  The night before surgery:  No food after midnight. ONLY clear liquids after midnight  The day of surgery (if you do NOT have diabetes):  Drink ONE (1) Pre-Surgery Clear Ensure by 7:30 the morning of surgery. Drink in one sitting. Do not sip.  This drink was given to you during your hospital  pre-op appointment visit.  Nothing else to drink after completing the  Pre-Surgery Clear Ensure.     Take these medicines the morning of surgery with A SIP OF WATER: Cetirizine (Zyrtec)   May take these medicines IF NEEDED: Acetaminophen (Tylenol) Oxymetazoline (Afrin)    One week prior to surgery, STOP taking any Aspirin (unless otherwise instructed by your surgeon) Aleve, Naproxen, Ibuprofen, Motrin, Advil, Goody's, BC's, all herbal medications, fish oil, and non-prescription vitamins.                     Do NOT Smoke (Tobacco/Vaping) for 24 hours prior to your procedure.  If you use a CPAP at night, you may bring your mask/headgear for your overnight stay.   You will be asked to remove any contacts, glasses, piercing's,  hearing aid's, dentures/partials prior to surgery. Please bring cases for these items if needed.    Patients discharged the day of surgery will not be allowed to drive home, and someone needs to stay with them for 24 hours.  SURGICAL WAITING ROOM VISITATION Patients may have no more than 2 support people in the waiting area - these visitors may rotate.   Pre-op nurse will coordinate an appropriate time for 1 ADULT support person, who may not rotate, to accompany patient in pre-op.  Children under the age of 24 must have an adult with them who is not the patient and must remain in the main waiting area with an adult.  If the patient needs to stay at the hospital during part of their recovery, the visitor guidelines for inpatient rooms apply.  Please refer to the [facility] website for the visitor guidelines for any additional information.   If you received a COVID test during your pre-op visit  it is requested that you wear a mask when out in public, stay away from anyone that may not be feeling well and notify your surgeon if you develop symptoms. If you have been in contact with anyone that has tested positive in the last 10 days please notify you surgeon.      Pre-operative 5 CHG Bathing Instructions   You can play a key role in reducing the risk of infection after surgery. Your skin needs to be as free of germs as possible. You can reduce the  number of germs on your skin by washing with CHG (chlorhexidine gluconate) soap before surgery. CHG is an antiseptic soap that kills germs and continues to kill germs even after washing.   DO NOT use if you have an allergy to chlorhexidine/CHG or antibacterial soaps. If your skin becomes reddened or irritated, stop using the CHG and notify one of our RNs at [phone].   Please shower with the CHG soap starting 4 days before surgery using the following schedule:     Please keep in mind the following:  DO NOT shave, including legs and  underarms, starting the day of your first shower.   You may shave your face at any point before/day of surgery.  Place clean sheets on your bed the day you start using CHG soap. Use a clean washcloth (not used since being washed) for each shower. DO NOT sleep with pets once you start using the CHG.   CHG Shower Instructions:  Wash your face and private area with normal soap. If you choose to wash your hair, wash first with your normal shampoo.  After you use shampoo/soap, rinse your hair and body thoroughly to remove shampoo/soap residue.  Turn the water OFF and apply about 3 tablespoons (45 ml) of CHG soap to a CLEAN washcloth.  Apply CHG soap ONLY FROM YOUR NECK DOWN TO YOUR TOES (washing for 3-5 minutes)  DO NOT use CHG soap on face, private areas, open wounds, or sores.  Pay special attention to the area where your surgery is being performed.  If you are having back surgery, having someone wash your back for you may be helpful. Wait 2 minutes after CHG soap is applied, then you may rinse off the CHG soap.  Pat dry with a clean towel  Put on clean clothes/pajamas   If you choose to wear lotion, please use ONLY the CHG-compatible lotions that are listed below.  Additional instructions for the day of surgery: DO NOT APPLY any lotions, deodorants, cologne, or perfumes.   Do not bring valuables to the hospital. [facility] is not responsible for any belongings/valuables. Do not wear nail polish, gel polish, artificial nails, or any other type of covering on natural nails (fingers and toes) Do not wear jewelry or makeup Put on clean/comfortable clothes.  Please brush your teeth.  Ask your nurse before applying any prescription medications to the skin.     CHG Compatible Lotions   Aveeno Moisturizing lotion  Cetaphil Moisturizing Cream  Cetaphil Moisturizing Lotion  Clairol Herbal Essence Moisturizing Lotion, Dry Skin  Clairol Herbal Essence Moisturizing Lotion, Extra Dry Skin   Clairol Herbal Essence Moisturizing Lotion, Normal Skin  Curel Age Defying Therapeutic Moisturizing Lotion with Alpha Hydroxy  Curel Extreme Care Body Lotion  Curel Soothing Hands Moisturizing Hand Lotion  Curel Therapeutic Moisturizing Cream, Fragrance-Free  Curel Therapeutic Moisturizing Lotion, Fragrance-Free  Curel Therapeutic Moisturizing Lotion, Original Formula  Eucerin Daily Replenishing Lotion  Eucerin Dry Skin Therapy Plus Alpha Hydroxy Crme  Eucerin Dry Skin Therapy Plus Alpha Hydroxy Lotion  Eucerin Original Crme  Eucerin Original Lotion  Eucerin Plus Crme Eucerin Plus Lotion  Eucerin TriLipid Replenishing Lotion  Keri Anti-Bacterial Hand Lotion  Keri Deep Conditioning Original Lotion Dry Skin Formula Softly Scented  Keri Deep Conditioning Original Lotion, Fragrance Free Sensitive Skin Formula  Keri Lotion Fast Absorbing Fragrance Free Sensitive Skin Formula  Keri Lotion Fast Absorbing Softly Scented Dry Skin Formula  Keri Original Lotion  Keri Skin Renewal Lotion Keri Silky Smooth Lotion  Keri Silky Smooth Sensitive Skin Lotion  Nivea Body Creamy Conditioning Oil  Nivea Body Extra Enriched Lotion  Nivea Body Original Lotion  Nivea Body Sheer Moisturizing Lotion Nivea Crme  Nivea Skin Firming Lotion  NutraDerm 30 Skin Lotion  NutraDerm Skin Lotion  NutraDerm Therapeutic Skin Cream  NutraDerm Therapeutic Skin Lotion  ProShield Protective Hand Cream  Provon moisturizing lotion  Please read over the following fact sheets that you were given.

## 2023-10-20 NOTE — Progress Notes (Signed)
PCP - Dr. Milinda Cave Cardiologist - denies  PPM/ICD - denies Device Orders - n/a Rep Notified - n/a  Chest x-ray - denies EKG - 10/20/23 Stress Test - denies ECHO - denies Cardiac Cath - denies  Sleep Study - OSA+ CPAP - does not wear a CPAP  Pre-diabetic - does not check blood sugars at home  Last dose of GLP1 agonist-  n/a GLP1 instructions:  n/a  Blood Thinner Instructions: n/a Aspirin Instructions: n/a  ERAS Protcol - clears until 0730 PRE-SURGERY Ensure or G2- Ensure as ordered  COVID TEST- n/a   Anesthesia review: yes - HTN, OSA+  Patient denies shortness of breath, fever, cough and chest pain at PAT appointment   All instructions explained to the patient, with a verbal understanding of the material. Patient agrees to go over the instructions while at home for a better understanding. Patient also instructed to self quarantine after being tested for COVID-19. The opportunity to ask questions was provided.

## 2023-10-24 ENCOUNTER — Telehealth: Payer: Self-pay | Admitting: *Deleted

## 2023-10-24 ENCOUNTER — Ambulatory Visit (INDEPENDENT_AMBULATORY_CARE_PROVIDER_SITE_OTHER): Payer: Medicare Other | Admitting: Orthopaedic Surgery

## 2023-10-24 VITALS — Ht 66.0 in | Wt 239.2 lb

## 2023-10-24 DIAGNOSIS — Z96651 Presence of right artificial knee joint: Secondary | ICD-10-CM

## 2023-10-24 NOTE — Telephone Encounter (Signed)
Ortho bundle pre-op call completed. 

## 2023-10-24 NOTE — Telephone Encounter (Signed)
Patient seen in office today for a nurse visit for height/weight check. He was seen in pre-op last week and was over the 40 BMI hardstop. Brought in today to have height/weight done in office. Patient reports he worked on eating less over the weekend. Height/weight obtained by [facility]. Patient's current BMI is acceptable to proceed with surgery tomorrow with Dr. Magnus Ivan.

## 2023-10-24 NOTE — H&P (Signed)
TOTAL KNEE ADMISSION H&P  Patient is being admitted for right total knee arthroplasty.  Subjective:  Chief Complaint:right knee pain.  HPI: Kevin Young, 68 y.o. male, has a history of pain and functional disability in the right knee due to arthritis and has failed non-surgical conservative treatments for greater than 12 weeks to includeNSAID's and/or analgesics, corticosteriod injections, viscosupplementation injections, flexibility and strengthening excercises, use of assistive devices, weight reduction as appropriate, and activity modification.  Onset of symptoms was gradual, starting 2 years ago with gradually worsening course since that time. The patient noted prior procedures on the knee to include  arthroscopy and menisectomy on the right knee(s).  Patient currently rates pain in the right knee(s) at 10 out of 10 with activity. Patient has night pain, worsening of pain with activity and weight bearing, pain that interferes with activities of daily living, pain with passive range of motion, crepitus, and joint swelling.  Patient has evidence of subchondral sclerosis, periarticular osteophytes, and joint space narrowing by imaging studies. There is no active infection.  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 Ewing Schlein.  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   Eustachian tube dysfunction    with recurrent "sinus issues"--ENT (Dr. Pollyann Kennedy) recommended surgery for nasal turbinate reduction and septoplasty--as of 11/03/15)   GERD (gastroesophageal reflux disease)    Hepatic steatosis    History of adenomatous polyp of colon    History of kidney stones    History of pneumonia    Hyperlipidemia    Hypertension    Started med 03/2018   IBS (irritable bowel syndrome)    Inguinal pain, left    Chronic; Dr. [name] to do MRI pelvis w/out contrast 08/2017 but insurance denied it.   Nephrolithiasis 12/23/2014   4mm nonobstructive R renal calc on CT abd/pelv done for diverticulitis. 04/2021 6 mm nonobstructing stone R kidney, slight R renal pelviectasis, no hydronephrosis.   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  Recall 07/2017 per Dr. Ewing Schlein   COLONOSCOPY  08/24/2017  Diverticulosis from transverse colon to sigmoid.  Otherwise normal.  Repeat in 5-10 years for screening/Dr. Ewing Schlein   ESOPHAGOGASTRODUODENOSCOPY     need specifics from pt   EXTRACORPOREAL SHOCK WAVE LITHOTRIPSY Right 12/10/2022   EXTRACORPOREAL SHOCK WAVE LITHOTRIPSY Right 12/10/2022   Procedure: RIGHT EXTRACORPOREAL SHOCK WAVE LITHOTRIPSY (ESWL);  Surgeon: Jannifer Hick, MD;  Location:  [facility];  Service: Urology;  Laterality: Right;  75 MINUTES NEEDED FOR CASE   HERNIA REPAIR Right 2024   multiple.  Right inguinal repair by Dr. Violeta Gelinas in 2004.  07/2017 having some muscular/nerve pain in groin--evaluated by Dr. Clarisse Gouge w/u at that time.   INCISIONAL HERNIA REPAIR N/A 04/22/2020   Procedure: HERNIA REPAIR INCISIONAL WITH MESH;  Surgeon: Violeta Gelinas, MD;  Location: [facility] OR;  Service: General;  Laterality: N/A;   KNEE ARTHROSCOPY Right 2024   meniscectomy   LAPAROSCOPIC SIGMOID COLECTOMY N/A 09/15/2015   Procedure: LAPAROSCOPIC ASSISTED  SIGMOID COLECTOMY;  Surgeon: Violeta Gelinas, MD;  Location: [facility] OR;  Service: General;  Laterality: N/A;   SUPRA-UMBILICAL HERNIA N/A 07/14/2021   Procedure: SUPRA-UMBILICAL HERNIA REPAIR WITH MESH;  Surgeon: Violeta Gelinas, MD;  Location: [facility] OR;  Service: General;  Laterality: N/A;   TONSILLECTOMY      No current facility-administered medications for this encounter.   Current Outpatient Medications  Medication Sig Dispense Refill Last Dose/Taking   acetaminophen (TYLENOL) 650 MG CR tablet Take 1,300 mg by mouth every 8 (eight) hours as needed for pain.   Taking As Needed   amLODipine-benazepril (LOTREL) 10-20 MG capsule Take 1 capsule by mouth daily. (Patient taking differently: Take 1 capsule by mouth every evening.) 90 capsule 1 Taking Differently   atorvastatin (LIPITOR) 20 MG tablet TAKE 1/2 TABLET(10 MG) BY MOUTH DAILY (Patient taking differently: TAKE 1/2 TABLET(10 MG) BY MOUTH IN THE EVENING) 45 tablet 1 Taking Differently   cetirizine (ZYRTEC) 10 MG tablet Take 10 mg by mouth every evening.   Taking   DM-Doxylamine-Acetaminophen (NYQUIL COLD & FLU PO) Take 1 Dose by mouth daily as needed (congestion/ cold symptoms).   Taking As Needed   Glucosamine-Chondroitin 750-600 MG TABS Take 2 tablets by mouth daily with lunch.   Taking   Lidocaine 4 % PTCH Apply 1 patch topically daily as needed (pain).   Taking  As Needed   Magnesium 500 MG TABS Take 1 tablet by mouth at bedtime.   Taking   Melatonin 10 MG TABS Take 10 mg by mouth at bedtime.    Taking   Multiple Vitamins-Minerals (MULTIVITAMIN WITH MINERALS) tablet Take 1 tablet by mouth daily.   Taking   Omega-3 Fatty Acids (FISH OIL) 1200 MG CAPS Take 1,200-2,400 mg by mouth See admin instructions. Take 2400 mg in the morning and 1200 mg in the evening   Taking   OVER THE COUNTER MEDICATION Take 2 tablets by mouth daily. Super Beta Prostate   Taking   oxymetazoline (AFRIN) 0.05 % nasal spray Place 1 spray into both nostrils at bedtime as needed for congestion.   Taking As Needed   Polyethylene Glycol 3350 (MIRALAX PO) Take 17 g by mouth daily.   Taking   Probiotic Product (ALIGN PO) Take 1 capsule by mouth every morning.   Taking   Psyllium (METAMUCIL PO) Take 1 Scoop by mouth at bedtime.   Taking   Allergies  Allergen Reactions   Codeine Itching    Social History   Tobacco Use   Smoking status: Former  Types: Cigars    Quit date: 12/02/2009    Years since quitting: 13.9   Smokeless tobacco: Former    Types: Chew    Quit date: 08/23/2013  Substance Use Topics   Alcohol use: Yes    Alcohol/week: 5.0 standard drinks of alcohol    Types: 5 Shots of liquor per week    Comment: 2-3 shots a day; 1 beer day    Family History  Problem Relation Age of Onset   GI problems Mother    Cancer Father        skin   Hypertension Father    Glaucoma Father    Migraines Sister    Seizures Brother    Depression Brother      Review of Systems  Objective:  Physical Exam Vitals reviewed.  Constitutional:      Appearance: Normal appearance. He is obese.  HENT:     Head: Normocephalic and atraumatic.  Eyes:     Extraocular Movements: Extraocular movements intact.     Pupils: Pupils are equal, round, and reactive to light.  Cardiovascular:     Rate and Rhythm: Normal rate and regular rhythm.     Pulses: Normal pulses.  Pulmonary:      Effort: Pulmonary effort is normal.     Breath sounds: Normal breath sounds.  Abdominal:     Palpations: Abdomen is soft.  Musculoskeletal:     Cervical back: Normal range of motion and neck supple.     Right knee: Effusion, bony tenderness and crepitus present. Decreased range of motion. Tenderness present over the medial joint line and lateral joint line. Abnormal alignment and abnormal meniscus.  Neurological:     Mental Status: He is alert and oriented to person, place, and time.  Psychiatric:        Behavior: Behavior normal.     Vital signs in last 24 hours: Weight:  [108.5 kg] 108.5 kg (01/20 1005)  Labs:   Estimated body mass index is 38.61 kg/m as calculated from the following:   Height as of 10/24/23: 5\' 6"  (1.676 m).   Weight as of 10/24/23: 108.5 kg.   Imaging Review Plain radiographs demonstrate severe degenerative joint disease of the right knee(s). The overall alignment ismild varus. The bone quality appears to be excellent for age and reported activity level.      Assessment/Plan:  End stage arthritis, right knee   The patient history, physical examination, clinical judgment of the provider and imaging studies are consistent with end stage degenerative joint disease of the right knee(s) and total knee arthroplasty is deemed medically necessary. The treatment options including medical management, injection therapy arthroscopy and arthroplasty were discussed at length. The risks and benefits of total knee arthroplasty were presented and reviewed. The risks due to aseptic loosening, infection, stiffness, patella tracking problems, thromboembolic complications and other imponderables were discussed. The patient acknowledged the explanation, agreed to proceed with the plan and consent was signed. Patient is being admitted for inpatient treatment for surgery, pain control, PT, OT, prophylactic antibiotics, VTE prophylaxis, progressive ambulation and ADL's and discharge  planning. The patient is planning to be discharged home with home health services

## 2023-10-24 NOTE — Care Plan (Signed)
[facility] RNCM spoke with patient prior to his upcoming Right total knee arthroplasty with Dr. Magnus Ivan at [facility]. He is agreeable to case management. Discussed his height/weight that was obtained at pre-op and noted that it has his BMI over the 40 hard stop. Did bring patient in on 10/24/23 to have height/weight done in office and encouraged healthy eating over the weekend. Patient did meet criteria for < 40 BMI. Reviewed all post op instructions. Anticipate HHPT will be needed after a short hospital stay. Referral made to [facility] after choice provided. He does have a RW and a cane. Will continue to follow for needs.

## 2023-10-25 ENCOUNTER — Observation Stay (HOSPITAL_COMMUNITY): Payer: Medicare Other

## 2023-10-25 ENCOUNTER — Encounter (HOSPITAL_COMMUNITY)
Admission: RE | Disposition: A | Discharge status: Home / Self Care | Payer: Self-pay | Source: Home / Self Care | Attending: Orthopaedic Surgery

## 2023-10-25 ENCOUNTER — Encounter (HOSPITAL_COMMUNITY): Payer: Self-pay | Admitting: Orthopaedic Surgery

## 2023-10-25 ENCOUNTER — Other Ambulatory Visit: Payer: Self-pay

## 2023-10-25 ENCOUNTER — Ambulatory Visit (HOSPITAL_COMMUNITY): Payer: Medicare Other | Admitting: Physician Assistant

## 2023-10-25 ENCOUNTER — Ambulatory Visit (HOSPITAL_COMMUNITY): Payer: Medicare Other

## 2023-10-25 ENCOUNTER — Observation Stay (HOSPITAL_COMMUNITY)
Admission: RE | Admit: 2023-10-25 | Discharge: 2023-10-27 | Disposition: A | Discharge status: Home / Self Care | Payer: Medicare Other | Attending: Orthopaedic Surgery | Admitting: Orthopaedic Surgery

## 2023-10-25 DIAGNOSIS — M1711 Unilateral primary osteoarthritis, right knee: Secondary | ICD-10-CM

## 2023-10-25 DIAGNOSIS — I10 Essential (primary) hypertension: Secondary | ICD-10-CM | POA: Insufficient documentation

## 2023-10-25 DIAGNOSIS — Z471 Aftercare following joint replacement surgery: Secondary | ICD-10-CM | POA: Diagnosis not present

## 2023-10-25 DIAGNOSIS — Z85828 Personal history of other malignant neoplasm of skin: Secondary | ICD-10-CM | POA: Insufficient documentation

## 2023-10-25 DIAGNOSIS — Z96651 Presence of right artificial knee joint: Secondary | ICD-10-CM | POA: Diagnosis not present

## 2023-10-25 DIAGNOSIS — Z79899 Other long term (current) drug therapy: Secondary | ICD-10-CM | POA: Insufficient documentation

## 2023-10-25 DIAGNOSIS — R609 Edema, unspecified: Secondary | ICD-10-CM | POA: Diagnosis not present

## 2023-10-25 DIAGNOSIS — G8918 Other acute postprocedural pain: Secondary | ICD-10-CM | POA: Diagnosis not present

## 2023-10-25 DIAGNOSIS — Z87891 Personal history of nicotine dependence: Secondary | ICD-10-CM | POA: Insufficient documentation

## 2023-10-25 HISTORY — PX: TOTAL KNEE ARTHROPLASTY: SHX125

## 2023-10-25 SURGERY — ARTHROPLASTY, KNEE, TOTAL
Anesthesia: Spinal | Site: Knee | Laterality: Right

## 2023-10-25 MED ORDER — BUPIVACAINE-EPINEPHRINE (PF) 0.25% -1:200000 IJ SOLN
INTRAMUSCULAR | Status: AC
  Filled 2023-10-25: qty 30

## 2023-10-25 MED ORDER — DEXAMETHASONE SODIUM PHOSPHATE 10 MG/ML IJ SOLN
INTRAMUSCULAR | Status: DC | PRN
  Administered 2023-10-25: 8 mg via INTRAVENOUS

## 2023-10-25 MED ORDER — FENTANYL CITRATE (PF) 100 MCG/2ML IJ SOLN: 50.0000 ug | Freq: Once | INTRAMUSCULAR | Status: AC

## 2023-10-25 MED ORDER — FENTANYL CITRATE (PF) 100 MCG/2ML IJ SOLN
INTRAMUSCULAR | Status: AC
  Administered 2023-10-25: 50 ug via INTRAVENOUS
  Filled 2023-10-25: qty 2

## 2023-10-25 MED ORDER — METOCLOPRAMIDE HCL 5 MG PO TABS: 5.0000 mg | ORAL_TABLET | Freq: Three times a day (TID) | ORAL | Status: DC | PRN

## 2023-10-25 MED ORDER — POVIDONE-IODINE 10 % EX SWAB
2.0000 | Freq: Once | CUTANEOUS | Status: AC
  Administered 2023-10-25: 2 via TOPICAL

## 2023-10-25 MED ORDER — DIPHENHYDRAMINE HCL 12.5 MG/5ML PO ELIX: 12.5000 mg | ORAL_SOLUTION | ORAL | Status: DC | PRN

## 2023-10-25 MED ORDER — DEXAMETHASONE SODIUM PHOSPHATE 10 MG/ML IJ SOLN
INTRAMUSCULAR | Status: AC
  Filled 2023-10-25: qty 1

## 2023-10-25 MED ORDER — SODIUM CHLORIDE 0.9 % IV SOLN: INTRAVENOUS | Status: AC

## 2023-10-25 MED ORDER — OXYCODONE HCL 5 MG PO TABS
5.0000 mg | ORAL_TABLET | Freq: Once | ORAL | Status: AC | PRN
  Administered 2023-10-25: 5 mg via ORAL

## 2023-10-25 MED ORDER — LIDOCAINE 2% (20 MG/ML) 5 ML SYRINGE
INTRAMUSCULAR | Status: DC | PRN
  Administered 2023-10-25: 40 mg via INTRAVENOUS

## 2023-10-25 MED ORDER — TRANEXAMIC ACID-NACL 1000-0.7 MG/100ML-% IV SOLN
1000.0000 mg | INTRAVENOUS | Status: AC
  Administered 2023-10-25: 1000 mg via INTRAVENOUS
  Filled 2023-10-25: qty 100

## 2023-10-25 MED ORDER — FENTANYL CITRATE (PF) 100 MCG/2ML IJ SOLN: 25.0000 ug | INTRAMUSCULAR | Status: DC | PRN

## 2023-10-25 MED ORDER — LIDOCAINE 2% (20 MG/ML) 5 ML SYRINGE
INTRAMUSCULAR | Status: AC
  Filled 2023-10-25: qty 5

## 2023-10-25 MED ORDER — POLYETHYLENE GLYCOL 3350 17 G PO PACK: 17.0000 g | PACK | Freq: Every day | ORAL | Status: DC | PRN

## 2023-10-25 MED ORDER — CEFAZOLIN SODIUM-DEXTROSE 2-4 GM/100ML-% IV SOLN
2.0000 g | INTRAVENOUS | Status: AC
  Administered 2023-10-25: 2 g via INTRAVENOUS
  Filled 2023-10-25: qty 100

## 2023-10-25 MED ORDER — ORAL CARE MOUTH RINSE: 15.0000 mL | Freq: Once | OROMUCOSAL | Status: AC

## 2023-10-25 MED ORDER — OXYCODONE HCL 5 MG/5ML PO SOLN: 5.0000 mg | Freq: Once | ORAL | Status: AC | PRN

## 2023-10-25 MED ORDER — AMISULPRIDE (ANTIEMETIC) 5 MG/2ML IV SOLN: 10.0000 mg | Freq: Once | INTRAVENOUS | Status: DC | PRN

## 2023-10-25 MED ORDER — ASPIRIN 81 MG PO CHEW
81.0000 mg | CHEWABLE_TABLET | Freq: Two times a day (BID) | ORAL | Status: DC
  Administered 2023-10-25 – 2023-10-27 (×4): 81 mg via ORAL
  Filled 2023-10-25 (×4): qty 1

## 2023-10-25 MED ORDER — FENTANYL CITRATE (PF) 250 MCG/5ML IJ SOLN
INTRAMUSCULAR | Status: DC | PRN
  Administered 2023-10-25: 50 ug via INTRAVENOUS

## 2023-10-25 MED ORDER — ACETAMINOPHEN 500 MG PO TABS: 1000.0000 mg | ORAL_TABLET | Freq: Once | ORAL | Status: DC

## 2023-10-25 MED ORDER — CHLORHEXIDINE GLUCONATE 0.12 % MT SOLN
15.0000 mL | Freq: Once | OROMUCOSAL | Status: AC
  Administered 2023-10-25: 15 mL via OROMUCOSAL
  Filled 2023-10-25: qty 15

## 2023-10-25 MED ORDER — BUPIVACAINE-EPINEPHRINE (PF) 0.25% -1:200000 IJ SOLN
INTRAMUSCULAR | Status: DC | PRN
  Administered 2023-10-25: 30 mL

## 2023-10-25 MED ORDER — ONDANSETRON HCL 4 MG/2ML IJ SOLN: 4.0000 mg | Freq: Four times a day (QID) | INTRAMUSCULAR | Status: DC | PRN

## 2023-10-25 MED ORDER — PANTOPRAZOLE SODIUM 40 MG PO TBEC
40.0000 mg | DELAYED_RELEASE_TABLET | Freq: Every day | ORAL | Status: DC
  Administered 2023-10-25 – 2023-10-27 (×3): 40 mg via ORAL
  Filled 2023-10-25 (×3): qty 1

## 2023-10-25 MED ORDER — MIDAZOLAM HCL 2 MG/2ML IJ SOLN
INTRAMUSCULAR | Status: AC
  Administered 2023-10-25: 1 mg via INTRAVENOUS
  Filled 2023-10-25: qty 2

## 2023-10-25 MED ORDER — PROPOFOL 500 MG/50ML IV EMUL
INTRAVENOUS | Status: DC | PRN
  Administered 2023-10-25: 100 ug/kg/min via INTRAVENOUS

## 2023-10-25 MED ORDER — MIDAZOLAM HCL 2 MG/2ML IJ SOLN: 1.0000 mg | Freq: Once | INTRAMUSCULAR | Status: AC

## 2023-10-25 MED ORDER — PHENOL 1.4 % MT LIQD: 1.0000 | OROMUCOSAL | Status: DC | PRN

## 2023-10-25 MED ORDER — CHLORHEXIDINE GLUCONATE 4 % EX SOLN: 1.0000 | CUTANEOUS | 1 refills | Status: DC

## 2023-10-25 MED ORDER — PROPOFOL 10 MG/ML IV BOLUS
INTRAVENOUS | Status: DC | PRN
  Administered 2023-10-25 (×3): 30 mg via INTRAVENOUS

## 2023-10-25 MED ORDER — OXYCODONE HCL 5 MG PO TABS
10.0000 mg | ORAL_TABLET | ORAL | Status: DC | PRN
  Administered 2023-10-25 – 2023-10-27 (×9): 15 mg via ORAL
  Filled 2023-10-25 (×9): qty 3

## 2023-10-25 MED ORDER — ACETAMINOPHEN 325 MG PO TABS
325.0000 mg | ORAL_TABLET | Freq: Four times a day (QID) | ORAL | Status: DC | PRN
  Administered 2023-10-25 – 2023-10-27 (×4): 650 mg via ORAL
  Filled 2023-10-25 (×4): qty 2

## 2023-10-25 MED ORDER — METHOCARBAMOL 1000 MG/10ML IJ SOLN: 500.0000 mg | Freq: Four times a day (QID) | INTRAMUSCULAR | Status: DC | PRN

## 2023-10-25 MED ORDER — ONDANSETRON HCL 4 MG PO TABS
4.0000 mg | ORAL_TABLET | Freq: Four times a day (QID) | ORAL | Status: DC | PRN
Start: 2023-10-25 — End: 2023-10-27

## 2023-10-25 MED ORDER — ONDANSETRON HCL 4 MG/2ML IJ SOLN
INTRAMUSCULAR | Status: AC
  Filled 2023-10-25: qty 2

## 2023-10-25 MED ORDER — PROPOFOL 10 MG/ML IV BOLUS
INTRAVENOUS | Status: AC
  Filled 2023-10-25: qty 20

## 2023-10-25 MED ORDER — AMLODIPINE BESYLATE 10 MG PO TABS
10.0000 mg | ORAL_TABLET | Freq: Every day | ORAL | Status: DC
Start: 2023-10-25 — End: 2023-10-27
  Administered 2023-10-25 – 2023-10-26 (×2): 10 mg via ORAL
  Filled 2023-10-25 (×3): qty 1

## 2023-10-25 MED ORDER — OXYCODONE HCL 5 MG PO TABS: 5.0000 mg | ORAL_TABLET | ORAL | Status: DC | PRN

## 2023-10-25 MED ORDER — METHOCARBAMOL 500 MG PO TABS
500.0000 mg | ORAL_TABLET | Freq: Four times a day (QID) | ORAL | Status: DC | PRN
  Administered 2023-10-25 – 2023-10-27 (×4): 500 mg via ORAL
  Filled 2023-10-25 (×5): qty 1

## 2023-10-25 MED ORDER — SODIUM CHLORIDE 0.9 % IR SOLN
Status: DC | PRN
  Administered 2023-10-25: 1000 mL

## 2023-10-25 MED ORDER — ALUM & MAG HYDROXIDE-SIMETH 200-200-20 MG/5ML PO SUSP: 30.0000 mL | ORAL | Status: DC | PRN

## 2023-10-25 MED ORDER — BUPIVACAINE IN DEXTROSE 0.75-8.25 % IT SOLN
INTRATHECAL | Status: DC | PRN
  Administered 2023-10-25: 1.6 mL via INTRATHECAL

## 2023-10-25 MED ORDER — BENAZEPRIL HCL 20 MG PO TABS
20.0000 mg | ORAL_TABLET | Freq: Every day | ORAL | Status: DC
  Administered 2023-10-25 – 2023-10-27 (×3): 20 mg via ORAL
  Filled 2023-10-25 (×3): qty 1

## 2023-10-25 MED ORDER — PHENYLEPHRINE 80 MCG/ML (10ML) SYRINGE FOR IV PUSH (FOR BLOOD PRESSURE SUPPORT)
PREFILLED_SYRINGE | INTRAVENOUS | Status: AC
  Filled 2023-10-25: qty 10

## 2023-10-25 MED ORDER — MUPIROCIN 2 % EX OINT: 1.0000 | TOPICAL_OINTMENT | Freq: Two times a day (BID) | CUTANEOUS | 0 refills | Status: AC

## 2023-10-25 MED ORDER — METOCLOPRAMIDE HCL 5 MG/ML IJ SOLN: 5.0000 mg | Freq: Three times a day (TID) | INTRAMUSCULAR | Status: DC | PRN

## 2023-10-25 MED ORDER — STERILE WATER FOR IRRIGATION IR SOLN
Status: DC | PRN
  Administered 2023-10-25: 1000 mL

## 2023-10-25 MED ORDER — FENTANYL CITRATE (PF) 250 MCG/5ML IJ SOLN
INTRAMUSCULAR | Status: AC
  Filled 2023-10-25: qty 5

## 2023-10-25 MED ORDER — DOCUSATE SODIUM 100 MG PO CAPS
100.0000 mg | ORAL_CAPSULE | Freq: Two times a day (BID) | ORAL | Status: DC
  Administered 2023-10-25 – 2023-10-27 (×4): 100 mg via ORAL
  Filled 2023-10-25 (×4): qty 1

## 2023-10-25 MED ORDER — 0.9 % SODIUM CHLORIDE (POUR BTL) OPTIME
TOPICAL | Status: DC | PRN
  Administered 2023-10-25: 1000 mL

## 2023-10-25 MED ORDER — OXYCODONE HCL 5 MG PO TABS
ORAL_TABLET | ORAL | Status: AC
  Filled 2023-10-25: qty 1

## 2023-10-25 MED ORDER — HYDROMORPHONE HCL 1 MG/ML IJ SOLN: 0.5000 mg | INTRAMUSCULAR | Status: DC | PRN

## 2023-10-25 MED ORDER — ONDANSETRON HCL 4 MG/2ML IJ SOLN
INTRAMUSCULAR | Status: DC | PRN
  Administered 2023-10-25: 4 mg via INTRAVENOUS

## 2023-10-25 MED ORDER — EPHEDRINE SULFATE-NACL 50-0.9 MG/10ML-% IV SOSY
PREFILLED_SYRINGE | INTRAVENOUS | Status: DC | PRN
  Administered 2023-10-25: 10 mg via INTRAVENOUS
  Administered 2023-10-25: 5 mg via INTRAVENOUS

## 2023-10-25 MED ORDER — PHENYLEPHRINE 80 MCG/ML (10ML) SYRINGE FOR IV PUSH (FOR BLOOD PRESSURE SUPPORT)
PREFILLED_SYRINGE | INTRAVENOUS | Status: DC | PRN
  Administered 2023-10-25 (×2): 160 ug via INTRAVENOUS

## 2023-10-25 MED ORDER — PHENYLEPHRINE HCL-NACL 20-0.9 MG/250ML-% IV SOLN
INTRAVENOUS | Status: DC | PRN
  Administered 2023-10-25: 40 ug/min via INTRAVENOUS

## 2023-10-25 MED ORDER — CLONIDINE HCL (ANALGESIA) 100 MCG/ML EP SOLN
EPIDURAL | Status: DC | PRN
  Administered 2023-10-25: 50 ug

## 2023-10-25 MED ORDER — LACTATED RINGERS IV SOLN: INTRAVENOUS | Status: DC

## 2023-10-25 MED ORDER — MENTHOL 3 MG MT LOZG: 1.0000 | LOZENGE | OROMUCOSAL | Status: DC | PRN

## 2023-10-25 MED ORDER — ROPIVACAINE HCL 5 MG/ML IJ SOLN
INTRAMUSCULAR | Status: DC | PRN
  Administered 2023-10-25: 20 mL via PERINEURAL

## 2023-10-25 SURGICAL SUPPLY — 63 items
BAG COUNTER SPONGE SURGICOUNT (BAG) ×2 IMPLANT
BANDAGE ESMARK 6X9 LF (GAUZE/BANDAGES/DRESSINGS) ×2 IMPLANT
BIT DRILL QUICK REL 1/8 2PK SL (BIT) IMPLANT
BLADE SAG 18X100X1.27 (BLADE) ×2 IMPLANT
BNDG ELASTIC 6INX 5YD STR LF (GAUZE/BANDAGES/DRESSINGS) IMPLANT
BNDG ELASTIC 6X5.8 VLCR STR LF (GAUZE/BANDAGES/DRESSINGS) ×4 IMPLANT
BNDG ESMARK 6X9 LF (GAUZE/BANDAGES/DRESSINGS) ×1
BOWL SMART MIX CTS (DISPOSABLE) IMPLANT
COMP PATELLA 3 PEG 35 (Joint) ×1 IMPLANT
COMPONENT PATELLA 3 PEG 35 (Joint) IMPLANT
COOLER ICEMAN CLASSIC (MISCELLANEOUS) ×2 IMPLANT
COVER SURGICAL LIGHT HANDLE (MISCELLANEOUS) ×2 IMPLANT
CUFF TOURN SGL QUICK 42 (TOURNIQUET CUFF) IMPLANT
CUFF TRNQT CYL 34X4.125X (TOURNIQUET CUFF) ×2 IMPLANT
DRAPE EXTREMITY T 121X128X90 (DISPOSABLE) ×2 IMPLANT
DRAPE HALF SHEET 40X57 (DRAPES) ×2 IMPLANT
DRAPE U-SHAPE 47X51 STRL (DRAPES) ×2 IMPLANT
DURAPREP 26ML APPLICATOR (WOUND CARE) ×2 IMPLANT
ELECT CAUTERY BLADE 6.4 (BLADE) ×2 IMPLANT
ELECT REM PT RETURN 9FT ADLT (ELECTROSURGICAL) ×1
ELECTRODE REM PT RTRN 9FT ADLT (ELECTROSURGICAL) ×2 IMPLANT
FACESHIELD WRAPAROUND (MASK) ×3
FACESHIELD WRAPAROUND OR TEAM (MASK) ×4 IMPLANT
GAUZE PAD ABD 8X10 STRL (GAUZE/BANDAGES/DRESSINGS) ×2 IMPLANT
GAUZE SPONGE 4X4 12PLY STRL (GAUZE/BANDAGES/DRESSINGS) ×2 IMPLANT
GAUZE XEROFORM 1X8 LF (GAUZE/BANDAGES/DRESSINGS) ×2 IMPLANT
GLOVE BIOGEL PI IND STRL 8 (GLOVE) ×4 IMPLANT
GLOVE ORTHO TXT STRL SZ7.5 (GLOVE) ×2 IMPLANT
GLOVE SURG ORTHO 8.0 STRL STRW (GLOVE) ×2 IMPLANT
GOWN STRL REUS W/ TWL LRG LVL3 (GOWN DISPOSABLE) IMPLANT
GOWN STRL REUS W/ TWL XL LVL3 (GOWN DISPOSABLE) ×4 IMPLANT
IMMOBILIZER KNEE 22 UNIV (SOFTGOODS) ×2 IMPLANT
INSERT TIB ARTISURF SZ8-11X12 (Insert) IMPLANT
IV NS 1000ML BAXH (IV SOLUTION) ×2 IMPLANT
KIT BASIN OR (CUSTOM PROCEDURE TRAY) ×2 IMPLANT
KIT TURNOVER KIT B (KITS) ×2 IMPLANT
MANIFOLD NEPTUNE II (INSTRUMENTS) ×2 IMPLANT
NDL 18GX1X1/2 (RX/OR ONLY) (NEEDLE) IMPLANT
NEEDLE 18GX1X1/2 (RX/OR ONLY) (NEEDLE) ×1
NS IRRIG 1000ML POUR BTL (IV SOLUTION) ×2 IMPLANT
PACK TOTAL JOINT (CUSTOM PROCEDURE TRAY) ×2 IMPLANT
PAD ARMBOARD 7.5X6 YLW CONV (MISCELLANEOUS) ×2 IMPLANT
PAD COLD SHLDR WRAP-ON (PAD) ×2 IMPLANT
PADDING CAST COTTON 6X4 STRL (CAST SUPPLIES) ×2 IMPLANT
PIN DRILL HDLS TROCAR 75 4PK (PIN) IMPLANT
PROS FEM KNEE PS STD 9 RT (Joint) ×1 IMPLANT
PROS TIB KNEE PS 0D F RT (Joint) ×1 IMPLANT
PROSTHESIS FEM KNEE PS STD9 RT (Joint) IMPLANT
PROSTHESIS TIB KNEE PS 0D F RT (Joint) IMPLANT
SCREW FEMALE HEX FIX 25X2.5 (ORTHOPEDIC DISPOSABLE SUPPLIES) IMPLANT
SET HNDPC FAN SPRY TIP SCT (DISPOSABLE) ×2 IMPLANT
SET PAD KNEE POSITIONER (MISCELLANEOUS) ×2 IMPLANT
STAPLER SKIN PROX WIDE 3.9 (STAPLE) IMPLANT
STAPLER VISISTAT 35W (STAPLE) ×2 IMPLANT
SUCTION TUBE FRAZIER 10FR DISP (SUCTIONS) ×2 IMPLANT
SUT VIC AB 0 CT1 27XBRD ANBCTR (SUTURE) ×2 IMPLANT
SUT VIC AB 1 CT1 27XBRD ANBCTR (SUTURE) ×4 IMPLANT
SUT VIC AB 2-0 CT1 TAPERPNT 27 (SUTURE) ×4 IMPLANT
SYR 50ML LL SCALE MARK (SYRINGE) IMPLANT
TOWEL GREEN STERILE (TOWEL DISPOSABLE) ×2 IMPLANT
TOWEL GREEN STERILE FF (TOWEL DISPOSABLE) ×2 IMPLANT
TRAY CATH INTERMITTENT SS 16FR (CATHETERS) IMPLANT
TRAY FOLEY W/BAG SLVR 16FR ST (SET/KITS/TRAYS/PACK) IMPLANT

## 2023-10-25 NOTE — Discharge Instructions (Signed)

## 2023-10-25 NOTE — Anesthesia Procedure Notes (Signed)
Spinal  Patient location during procedure: OR Start time: 10/25/2023 11:25 AM End time: 10/25/2023 11:30 AM Reason for block: surgical anesthesia Staffing Performed: anesthesiologist  Anesthesiologist: Marcene Duos, MD Performed by: Marcene Duos, MD Authorized by: Marcene Duos, MD   Preanesthetic Checklist Completed: patient identified, IV checked, site marked, risks and benefits discussed, surgical consent, monitors and equipment checked, pre-op evaluation and timeout performed Spinal Block Patient position: sitting Prep: DuraPrep Patient monitoring: heart rate, cardiac monitor, continuous pulse ox and blood pressure Approach: midline Location: L4-5 Injection technique: single-shot Needle Needle type: Pencan  Needle gauge: 24 G Needle length: 9 cm Assessment Sensory level: T4 Events: CSF return

## 2023-10-25 NOTE — Op Note (Signed)
Operative Note  Date of operation: 10/25/2023 Preoperative diagnosis: Right knee primary osteoarthritis Postoperative diagnosis: Same  Procedure: Right press-fit total knee arthroplasty  Implants: Biomet/Zimmer persona press-fit knee system Implant Name Type Inv. Item Serial No. Manufacturer Lot No. LRB No. Used Action  INSERT TIB ARTISURF [id number] - [id number] Insert INSERT TIB ARTISURF [id number]  ZIMMER RECON(ORTH,TRAU,BIO,SG) [id number] Right 1 Implanted  COMP PATELLA 3 PEG 35 - [id number] Joint COMP PATELLA 3 PEG 35  ZIMMER RECON(ORTH,TRAU,BIO,SG) [id number] Right 1 Implanted  PROS TIB KNEE PS 0D F RT - [id number] Joint PROS TIB KNEE PS 0D F RT  ZIMMER RECON(ORTH,TRAU,BIO,SG) [id number] Right 1 Implanted  PROS FEM KNEE PS STD 9 RT - [id number] Joint PROS FEM KNEE PS STD 9 RT  ZIMMER RECON(ORTH,TRAU,BIO,SG) [id number] Right 1 Implanted   Surgeon: Vanita Panda. Magnus Ivan, MD Assistant: Rexene Edison, PA-C  Anesthesia: #1 right lower extremity adductor canal block, #2 spinal, #3 local Tourniquet time: Under 1 hour EBL: Less than 100 cc Antibiotics: IV Ancef Complications: None  Indications: The patient is a 68 year old gentleman with well-documented debilitating end-stage arthritis involving his right knee.  He has tried and failed conservative treatment for over a year and is even had arthroscopic surgery on the right knee.  At this point his right knee pain is daily.  He continues to have effusion with that knee.  He has tried and failed injections as well as activity modification.  He has had steroid and hyaluronic acid.  His right knee pain has become daily and it is detrimentally affecting his mobility, his quality of life, and his actives daily living to the point he wishes to proceed with a knee replacement and we agree with this as well.  We did discuss in length in detail the risks of acute blood loss anemia, nerve or vessel injury, fracture, DVT, implant failure and wound healing issues.   He understands that our goals are hopefully decreased pain, improved mobility, and improved quality of life.  Procedure description: After informed consent was obtained and the appropriate right knee was marked, anesthesia obtained a right lower extremity adductor canal block in the holding room.  The patient was then brought to the operating room and set up on the operating table where spinal anesthesia was obtained.  He was laid in the supine position on the operating table and a Foley catheter was placed.  A nonsterile tractors placed around his upper right thigh and his right thigh, knee, leg and ankle were prepped and draped with DuraPrep and sterile drapes including a sterile stockinette.  A timeout was called and he was identified as the correct patient and the correct right knee.  An Esmarch was then used to wrap out the leg and the tourniquet was inflated to 300 mm of pressure.  With the knee extended a direct midline incision was made over the patella and carried proximally distally.  Dissection was carried down to the knee joint and a medial parapatellar arthrotomy was made finding a large joint effusion.  With the knee in a flexed position we found significant cartilage as well as the patellofemoral joint and the medial compartment of the knee.  Remnants of the medial lateral meniscus and ACL were removed we then went to the femur and used an we then used an extramedullary cutting guide for making our proximal tibia cut correction for varus and valgus and a 7 degree slope only made this cut to take 2 mm off the low side and backed this down to  more millimeters.  We then went to the femur and used an intramedullary based cutting guide for making our distal femur cut setting this for a right knee at 5 degrees externally rotated and a 10 mm distal femoral cut.  Made this cut without difficulty and brought the knee back down to full extension with a 10 mm extension block at achieve full extension.  We then  went back to the femur and put a femoral sizing guide based off the epicondylar axis.  Based off of this we chose a size 9 femur.  We put a 4-in-1 cutting block for a size 9 femur and made our anterior and posterior cuts followed our chamfer cuts.  We then impacted the tibia and chose a size F right tibial tray for coverage over the tibial plateau setting the rotation of the tibial tubercle on the femur.  We did our drill hole and keel punch off of this and felt like the patient had good quality bone for press-fit implants.  We then trialed the size F right tibial tray combined with the size 9 right CR standard femur.  We placed a 10 mm medial congruent polyethylene insert and we felt better stability with a 12 mm thickness medial congruent insert.  We then made a patella cut and drilled 3 holes for a size 35 patella button.  Again with all trial instrumentation the knee within the knee through several cycles of motion and we are pleased the range of motion and stability.  We then removed all transportation from the knee and irrigated the knee with normal saline solution.  Next with the knee in a flexed position we placed our press-fit Biomet Zimmer persona tibial tray for right knee size F followed by press fitting our size 9 right CR standard femur.  We placed our 12 mm thickness right medial congruent polythene insert and press-fit our size 35 patella button.  Again we are pleased the range of motion and stability with his inserts.  The tourniquet was then let down and hemostasis was obtained electrocautery.  We placed Marcaine with epinephrine around the arthrotomy.  The arthrotomy was then closed with interrupted #1 Vicryl suture followed by 0 Vicryl to close the deep tissue and 2-0 Vicryl to close subcutaneous tissue.  The skin was closed with staples.  Well-padded sterile dressing was applied.  The patient was taken the recovery room in stable condition.  Rexene Edison, PA-C did assist during the entire case and  beginning the end and his assistance was medically necessary and crucial for soft tissue management and retraction, helping guide implant placement and a layered closure of the wound.

## 2023-10-25 NOTE — Transfer of Care (Signed)
Immediate Anesthesia Transfer of Care Note  Patient: Kevin Young  Procedure(s) Performed: RIGHT TOTAL KNEE ARTHROPLASTY (Right: Knee)  Patient Location: PACU  Anesthesia Type:Spinal  Level of Consciousness: awake, alert , and patient cooperative  Airway & Oxygen Therapy: Patient Spontanous Breathing and Patient connected to face mask oxygen  Post-op Assessment: Report given to RN and Post -op Vital signs reviewed and stable  Post vital signs: Reviewed and stable  Last Vitals:  Vitals Value Taken Time  BP 104/69 10/25/23 1317  Temp 36.8 C 10/25/23 1317  Pulse 63 10/25/23 1317  Resp 16 10/25/23 1317  SpO2 98 % 10/25/23 1317  Vitals shown include unfiled device data.  Last Pain:  Vitals:   10/25/23 0926  TempSrc:   PainSc: 0-No pain      Patients Stated Pain Goal: 0 (10/25/23 0926)  Complications: No notable events documented.

## 2023-10-25 NOTE — Anesthesia Procedure Notes (Signed)
Anesthesia Regional Block: Adductor canal block   Pre-Anesthetic Checklist: , timeout performed,  Correct Patient, Correct Site, Correct Laterality,  Correct Procedure, Correct Position, site marked,  Risks and benefits discussed,  Surgical consent,  Pre-op evaluation,  At surgeon's request and post-op pain management  Laterality: Right  Prep: chloraprep       Needles:  Injection technique: Single-shot  Needle Type: Echogenic Needle     Needle Length: 9cm  Needle Gauge: 21     Additional Needles:   Procedures:,,,, ultrasound used (permanent image in chart),,    Narrative:  Start time: 10/25/2023 9:02 AM End time: 10/25/2023 9:09 AM Injection made incrementally with aspirations every 5 mL.  Performed by: Personally  Anesthesiologist: Marcene Duos, MD

## 2023-10-25 NOTE — Evaluation (Signed)
Physical Therapy Evaluation Patient Details Name: Kevin Young MRN: [id number] DOB: [birth year] Today's Date: 10/25/2023  History of Present Illness  68 y.o. male presents to [facility] 10/25/2023 for elective R TKA. PMH includes HLD, depressive disorder, IBS, OSA, HTN.  Clinical Impression  Pt presents to PT mobilizing well s/p R TKA. Pt is able to ambulate for household distances with support of RW. PT provides education on TKR exercise packet in an effort to improve ROM and strength. Pt will benefit from further gait and stair training tomorrow as well as further education on use of ice s/p mobility.        If plan is discharge home, recommend the following: A little help with bathing/dressing/bathroom;Assistance with cooking/housework;Assist for transportation;Help with stairs or ramp for entrance   Can travel by private vehicle        Equipment Recommendations None recommended by PT (pt owns RW and has comfort height commodes)  Recommendations for Other Services       Functional Status Assessment Patient has had a recent decline in their functional status and demonstrates the ability to make significant improvements in function in a reasonable and predictable amount of time.     Precautions / Restrictions Precautions Precautions: Fall;Knee Precaution Booklet Issued: Yes (comment) Restrictions Weight Bearing Restrictions Per Provider Order: Yes RLE Weight Bearing Per Provider Order: Weight bearing as tolerated      Mobility  Bed Mobility Overal bed mobility: Needs Assistance Bed Mobility: Supine to Sit     Supine to sit: Min assist, HOB elevated          Transfers Overall transfer level: Needs assistance Equipment used: Rolling walker (2 wheels) Transfers: Sit to/from Stand Sit to Stand: Contact guard assist                Ambulation/Gait Ambulation/Gait assistance: Contact guard assist Gait Distance (Feet): 100 Feet Assistive device:  Rolling walker (2 wheels) Gait Pattern/deviations: Step-through pattern Gait velocity: reduced Gait velocity interpretation: <1.8 ft/sec, indicate of risk for recurrent falls   General Gait Details: slowed step-through gait, reduced stance time and terminal knee extension of RLE  Stairs            Wheelchair Mobility     Tilt Bed    Modified Rankin (Stroke Patients Only)       Balance Overall balance assessment: Needs assistance Sitting-balance support: No upper extremity supported, Feet supported Sitting balance-Leahy Scale: Good     Standing balance support: Bilateral upper extremity supported, Reliant on assistive device for balance Standing balance-Leahy Scale: Poor                               Pertinent Vitals/Pain Pain Assessment Pain Assessment: 0-10 Pain Score: 6  Pain Location: R knee Pain Descriptors / Indicators: Aching Pain Intervention(s): Monitored during session    Home Living Family/patient expects to be discharged to:: Private residence Living Arrangements: Alone Available Help at Discharge: Family;Available 24 hours/day Type of Home: House Home Access: Stairs to enter Entrance Stairs-Rails: Right Entrance Stairs-Number of Steps: 2   Home Layout: One level Home Equipment: Agricultural consultant (2 wheels);Shower seat      Prior Function Prior Level of Function : Independent/Modified Independent;Driving             Mobility Comments: ambulatory without DME       Extremity/Trunk Assessment   Upper Extremity Assessment Upper Extremity Assessment: Overall WFL for tasks assessed  Lower Extremity Assessment Lower Extremity Assessment: RLE deficits/detail RLE Deficits / Details: generalized post-op strength and ROM deficits as anticipated s.p TKA    Cervical / Trunk Assessment Cervical / Trunk Assessment: Normal  Communication   Communication Communication: No apparent difficulties Cueing Techniques: Verbal cues   Cognition Arousal: Alert Behavior During Therapy: WFL for tasks assessed/performed Overall Cognitive Status: Within Functional Limits for tasks assessed                                          General Comments General comments (skin integrity, edema, etc.): VSS on RA    Exercises Other Exercises Other Exercises: PT provides education on TKR exercise packet   Assessment/Plan    PT Assessment Patient needs continued PT services  PT Problem List Decreased strength;Decreased range of motion;Decreased activity tolerance;Decreased balance;Decreased mobility;Decreased knowledge of use of DME;Pain       PT Treatment Interventions DME instruction;Gait training;Stair training;Functional mobility training;Therapeutic activities;Therapeutic exercise;Balance training;Neuromuscular re-education;Patient/family education    PT Goals (Current goals can be found in the Care Plan section)  Acute Rehab PT Goals Patient Stated Goal: to return to independence PT Goal Formulation: With patient Time For Goal Achievement: 10/29/23 Potential to Achieve Goals: Good    Frequency Min 1X/week     Co-evaluation               AM-PAC PT "6 Clicks" Mobility  Outcome Measure Help needed turning from your back to your side while in a flat bed without using bedrails?: A Little Help needed moving from lying on your back to sitting on the side of a flat bed without using bedrails?: A Little Help needed moving to and from a bed to a chair (including a wheelchair)?: A Little Help needed standing up from a chair using your arms (e.g., wheelchair or bedside chair)?: A Little Help needed to walk in hospital room?: A Little Help needed climbing 3-5 steps with a railing? : A Lot 6 Click Score: 17    End of Session Equipment Utilized During Treatment: Gait belt Activity Tolerance: Patient tolerated treatment well Patient left: in chair;with call bell/phone within reach Nurse  Communication: Mobility status PT Visit Diagnosis: Other abnormalities of gait and mobility (R26.89);Muscle weakness (generalized) (M62.81);Pain Pain - Right/Left: Right Pain - part of body: Knee    Time: 2130-8657 PT Time Calculation (min) (ACUTE ONLY): 30 min   Charges:   PT Evaluation $PT Eval Low Complexity: 1 Low   PT General Charges $$ ACUTE PT VISIT: 1 Visit         Arlyss Gandy, PT, DPT Acute Rehabilitation Office [phone]   Arlyss Gandy 10/25/2023, 5:19 PM

## 2023-10-25 NOTE — Anesthesia Preprocedure Evaluation (Signed)
Anesthesia Evaluation  Patient identified by MRN, date of birth, ID band Patient awake    Reviewed: Allergy & Precautions, NPO status , Patient's Chart, lab work & pertinent test results  Airway Mallampati: II  TM Distance: >3 FB Neck ROM: Full    Dental   Pulmonary sleep apnea , former smoker   Pulmonary exam normal        Cardiovascular hypertension, Pt. on medications Normal cardiovascular exam     Neuro/Psych negative neurological ROS     GI/Hepatic Neg liver ROS,GERD  ,,  Endo/Other  negative endocrine ROS    Renal/GU Renal disease     Musculoskeletal  (+) Arthritis ,    Abdominal   Peds  Hematology negative hematology ROS (+)   Anesthesia Other Findings   Reproductive/Obstetrics                             Anesthesia Physical Anesthesia Plan  ASA: 2  Anesthesia Plan: Spinal   Post-op Pain Management: Regional block* and Tylenol PO (pre-op)*   Induction:   PONV Risk Score and Plan: 1 and Propofol infusion, Ondansetron, Dexamethasone and Treatment may vary due to age or medical condition  Airway Management Planned: Natural Airway and Simple Face Mask  Additional Equipment:   Intra-op Plan:   Post-operative Plan:   Informed Consent: I have reviewed the patients History and Physical, chart, labs and discussed the procedure including the risks, benefits and alternatives for the proposed anesthesia with the patient or authorized representative who has indicated his/her understanding and acceptance.       Plan Discussed with: CRNA  Anesthesia Plan Comments:        Anesthesia Quick Evaluation

## 2023-10-25 NOTE — Interval H&P Note (Signed)
History and Physical Interval Note: The patient understands that he is here today for a right total knee replacement to treat his significant right knee primary osteoarthritis.  There has been no acute or interval change in his medical status.  The risks and benefits of surgery been discussed in detail and informed consent has been obtained.  The right operative knee has been marked.  10/25/2023 8:59 AM  Kevin Young  has presented today for surgery, with the diagnosis of OSTEOARTHRITIS RIGHT KNEE.  The various methods of treatment have been discussed with the patient and family. After consideration of risks, benefits and other options for treatment, the patient has consented to  Procedure(s): RIGHT TOTAL KNEE ARTHROPLASTY (Right) as a surgical intervention.  The patient's history has been reviewed, patient examined, no change in status, stable for surgery.  I have reviewed the patient's chart and labs.  Questions were answered to the patient's satisfaction.     Kathryne Hitch

## 2023-10-25 NOTE — Plan of Care (Signed)
  Problem: Education: Goal: Knowledge of General Education information will improve Description: Including pain rating scale, medication(s)/side effects and non-pharmacologic comfort measures Outcome: Progressing   Problem: Clinical Measurements: Goal: Will remain free from infection Outcome: Progressing   Problem: Activity: Goal: Risk for activity intolerance will decrease Outcome: Progressing   Problem: Nutrition: Goal: Adequate nutrition will be maintained Outcome: Progressing   Problem: Pain Managment: Goal: General experience of comfort will improve and/or be controlled Outcome: Progressing

## 2023-10-25 NOTE — Progress Notes (Unsigned)
 [name]

## 2023-10-26 ENCOUNTER — Encounter (HOSPITAL_COMMUNITY): Payer: Self-pay | Admitting: Orthopaedic Surgery

## 2023-10-26 DIAGNOSIS — Z79899 Other long term (current) drug therapy: Secondary | ICD-10-CM | POA: Diagnosis not present

## 2023-10-26 DIAGNOSIS — Z87891 Personal history of nicotine dependence: Secondary | ICD-10-CM | POA: Diagnosis not present

## 2023-10-26 DIAGNOSIS — Z85828 Personal history of other malignant neoplasm of skin: Secondary | ICD-10-CM | POA: Diagnosis not present

## 2023-10-26 DIAGNOSIS — I10 Essential (primary) hypertension: Secondary | ICD-10-CM | POA: Diagnosis not present

## 2023-10-26 DIAGNOSIS — M1711 Unilateral primary osteoarthritis, right knee: Secondary | ICD-10-CM | POA: Diagnosis not present

## 2023-10-26 LAB — BASIC METABOLIC PANEL
Anion gap: 9 (ref 5–15)
BUN: 13 mg/dL (ref 8–23)
CO2: 22 mmol/L (ref 22–32)
Calcium: 8.8 mg/dL — ABNORMAL LOW (ref 8.9–10.3)
Chloride: 103 mmol/L (ref 98–111)
Creatinine, Ser: 0.95 mg/dL (ref 0.61–1.24)
GFR, Estimated: >60 mL/min (ref >60)
Glucose, Bld: 153 mg/dL — ABNORMAL HIGH (ref 70–99)
Potassium: 4.3 mmol/L (ref 3.5–5.1)
Sodium: 134 mmol/L — ABNORMAL LOW (ref 135–145)

## 2023-10-26 LAB — CBC
HCT: 42.1 % (ref 39.0–52.0)
Hemoglobin: 14.7 g/dL (ref 13.0–17.0)
MCH: 31.6 pg (ref 26.0–34.0)
MCHC: 34.9 g/dL (ref 30.0–36.0)
MCV: 90.5 fL (ref 80.0–100.0)
Platelets: 223 10*3/uL (ref 150–400)
RBC: 4.65 MIL/uL (ref 4.22–5.81)
RDW: 12.7 % (ref 11.5–15.5)
WBC: 17.6 10*3/uL — ABNORMAL HIGH (ref 4.0–10.5)
nRBC: 0 % (ref 0.0–0.2)

## 2023-10-26 MED ORDER — KETOROLAC TROMETHAMINE 15 MG/ML IJ SOLN
7.5000 mg | Freq: Four times a day (QID) | INTRAMUSCULAR | Status: AC
  Administered 2023-10-26 (×3): 7.5 mg via INTRAVENOUS
  Filled 2023-10-26 (×3): qty 1

## 2023-10-26 NOTE — Progress Notes (Signed)
Subjective: 1 Day Post-Op Procedure(s) (LRB): RIGHT TOTAL KNEE ARTHROPLASTY (Right) Patient reports pain as moderate.  Didn't sleep at all last night.  Objective: Vital signs in last 24 hours: Temp:  [97.6 F (36.4 C)-98.5 F (36.9 C)] 98.3 F (36.8 C) (01/22 0741) Pulse Rate:  [59-94] 92 (01/22 0741) Resp:  [13-19] 18 (01/22 0741) BP: (102-145)/(60-83) 137/79 (01/22 0741) SpO2:  [92 %-100 %] 95 % (01/22 0741) Weight:  [108.4 kg] 108.4 kg (01/21 0824)  Intake/Output from previous day: 01/21 0701 - 01/22 0700 In: 980 [P.O.:180; I.V.:700; IV Piggyback:100] Out: 1830 [Urine:1800; Blood:30] Intake/Output this shift: No intake/output data recorded.  Recent Labs    10/26/23 0526  HGB 14.7   Recent Labs    10/26/23 0526  WBC 17.6*  RBC 4.65  HCT 42.1  PLT 223   Recent Labs    10/26/23 0526  NA 134*  K 4.3  CL 103  CO2 22  BUN 13  CREATININE 0.95  GLUCOSE 153*  CALCIUM 8.8*   No results for input(s): "LABPT", "INR" in the last 72 hours.  Sensation intact distally Intact pulses distally Dorsiflexion/Plantar flexion intact Incision: dressing C/D/I Compartment soft   Assessment/Plan: 1 Day Post-Op Procedure(s) (LRB): RIGHT TOTAL KNEE ARTHROPLASTY (Right) Up with therapy Plan for discharge tomorrow Discharge home with home health      Kevin Young 10/26/2023, 7:58 AM

## 2023-10-26 NOTE — Anesthesia Postprocedure Evaluation (Signed)
Anesthesia Post Note  Patient: Kevin Young  Procedure(s) Performed: RIGHT TOTAL KNEE ARTHROPLASTY (Right: Knee)     Patient location during evaluation: PACU Anesthesia Type: Spinal Level of consciousness: awake and alert Pain management: pain level controlled Vital Signs Assessment: post-procedure vital signs reviewed and stable Respiratory status: spontaneous breathing and respiratory function stable Cardiovascular status: blood pressure returned to baseline and stable Postop Assessment: spinal receding Anesthetic complications: no   No notable events documented.  Last Vitals:  Vitals:   10/26/23 0536 10/26/23 0741  BP: 131/74 137/79  Pulse: 83 92  Resp: 18 18  Temp: 36.5 C 36.8 C  SpO2: 95% 95%    Last Pain:  Vitals:   10/26/23 1359  TempSrc:   PainSc: 7                  Kennieth Rad

## 2023-10-26 NOTE — Progress Notes (Signed)
Physical Therapy Treatment Patient Details Name: Kevin Young MRN: [id number] DOB: [birth year] Today's Date: 10/26/2023   History of Present Illness 68 y.o. male presents to [facility] hospital on 10/25/2023 for elective R TKA. PMH includes HLD, depressive disorder, IBS, OSA, HTN.    PT Comments  Patient was willing to participate with therapy for the second session of the day. The patient agreed to work on stair training and defer gait training due to pain. Educated and demonstrated using a step up method w/o an AD while using the railing on the right-hand side for support. Patient was unable to flex the left hip due to limited weight distribution on the RLE. Instead, the patient was able to ascend 3 steps by climbing up backwards and having the RW in front. The patient descended stairs by setting the RW in front while stepping-to the RW. The patient required CGA +1 to ensure safety. The patient will continue to benefit from further gait and stair training activities.       If plan is discharge home, recommend the following: A little help with bathing/dressing/bathroom;Assistance with cooking/housework;Assist for transportation;Help with stairs or ramp for entrance   Can travel by private vehicle        Equipment Recommendations  None recommended by PT    Recommendations for Other Services       Precautions / Restrictions Precautions Precautions: Fall;Knee Precaution Booklet Issued: Yes (comment) Precaution Comments: Patient is compliant with HEP Restrictions Weight Bearing Restrictions Per Provider Order: Yes RLE Weight Bearing Per Provider Order: Weight bearing as tolerated     Mobility  Bed Mobility Overal bed mobility: Needs Assistance Bed Mobility: Sit to Supine     Supine to sit: Min assist, HOB elevated Sit to supine: Min assist, HOB elevated   General bed mobility comments: Patient safely demonstrates good bed mobility while using UE support with hands on the head  railing, needed assistance to move operative leg on bed    Transfers Overall transfer level: Needs assistance Equipment used: Rolling walker (2 wheels) Transfers: Sit to/from Stand Sit to Stand: Contact guard assist           General transfer comment: Patient able to distribute weight through legs (L>R), while using UE support to push up from a sitting position    Ambulation/Gait Ambulation/Gait assistance: Contact guard assist Gait Distance (Feet): 100 Feet Assistive device: Rolling walker (2 wheels) Gait Pattern/deviations: Step-through pattern Gait velocity: reduced     General Gait Details: Deferred gait training due to pain, worked on stair training   Stairs Stairs: Yes Stairs assistance: Contact guard assist Stair Management: One rail Right, Backwards, Forwards Number of Stairs: 3 General stair comments: Ascended steps going backwards w/ RW in front, descended steps going forward w/ RW in front   Wheelchair Mobility     Tilt Bed    Modified Rankin (Stroke Patients Only)       Balance Overall balance assessment: Needs assistance Sitting-balance support: No upper extremity supported, Feet supported Sitting balance-Leahy Scale: Good     Standing balance support: Bilateral upper extremity supported, Reliant on assistive device for balance Standing balance-Leahy Scale: Poor                              Cognition Arousal: Alert Behavior During Therapy: WFL for tasks assessed/performed Overall Cognitive Status: Within Functional Limits for tasks assessed  Exercises Total Joint Exercises Ankle Circles/Pumps: AROM, Right, 10 reps Quad Sets: AROM, Right, 10 reps, Supine Short Arc Quad: AROM, Right, 10 reps, Supine Heel Slides: Strengthening, Right, PROM, 10 reps, Supine Hip ABduction/ADduction: AROM, Strengthening, Right, 10 reps Goniometric ROM: -16-80    General Comments         Pertinent Vitals/Pain Pain Assessment Pain Assessment: Faces Pain Score: 7  Pain Location: R knee Pain Descriptors / Indicators: Aching, Grimacing Pain Intervention(s): Premedicated before session, Monitored during session, Ice applied    Home Living                          Prior Function            PT Goals (current goals can now be found in the care plan section) Acute Rehab PT Goals PT Goal Formulation: With patient Time For Goal Achievement: 10/29/23 Potential to Achieve Goals: Good Progress towards PT goals: Progressing toward goals    Frequency    Min 1X/week      PT Plan      Co-evaluation              AM-PAC PT "6 Clicks" Mobility   Outcome Measure  Help needed turning from your back to your side while in a flat bed without using bedrails?: A Little Help needed moving from lying on your back to sitting on the side of a flat bed without using bedrails?: A Little Help needed moving to and from a bed to a chair (including a wheelchair)?: A Little Help needed standing up from a chair using your arms (e.g., wheelchair or bedside chair)?: A Little Help needed to walk in hospital room?: A Little Help needed climbing 3-5 steps with a railing? : A Little 6 Click Score: 18    End of Session Equipment Utilized During Treatment: Gait belt Activity Tolerance: Patient tolerated treatment well Patient left: in bed;with call bell/phone within reach   PT Visit Diagnosis: Other abnormalities of gait and mobility (R26.89);Muscle weakness (generalized) (M62.81);Pain Pain - Right/Left: Right Pain - part of body: Knee     Time: 1610-9604 PT Time Calculation (min) (ACUTE ONLY): 28 min  Charges:    $Gait Training: 8-22 mins $Therapeutic Exercise: 8-22 mins $Therapeutic Activity: 8-22 mins PT General Charges $$ ACUTE PT VISIT: 1 Visit                     Doreen Beam, SPT   Tareq Dwan 10/26/2023, 4:23 PM

## 2023-10-26 NOTE — Plan of Care (Signed)

## 2023-10-26 NOTE — Care Management Obs Status (Signed)
MEDICARE OBSERVATION STATUS NOTIFICATION   Patient Details  Name: CHANCELOR LICHTENSTEIN MRN: [id number] Date of Birth: 12-14-1955   Medicare Observation Status Notification Given:  Yes    Lorri Frederick, LCSW 10/26/2023, 11:26 AM

## 2023-10-26 NOTE — Progress Notes (Addendum)
Physical Therapy Treatment Patient Details Name: Kevin Young MRN: [id number] DOB: 1956/06/20 Today's Date: 10/26/2023   History of Present Illness 68 y.o. male presents to [facility] hospital on 10/25/2023 for elective R TKA. PMH includes HLD, depressive disorder, IBS, OSA, HTN.    PT Comments  The patient is compliant with his HEP and was able to perform each exercise in supine and sitting. The patient is able to transition from supine > sitting, utilizing UE support on the [facility] railings and Min A +1 to move bilateral LE's to EOB. Patient safely uses UE support to push off from the bed to an upright standing position, demonstrating good quad activation needed to support and initiate ascension from bed. The patient is  CGA +1  and is able to ambulate 100 within the unit for household distances with support of a RW . The patient will continue to benefit from further gait and stair training activities.    If plan is discharge home, recommend the following: A little help with bathing/dressing/bathroom;Assistance with cooking/housework;Assist for transportation;Help with stairs or ramp for entrance   Can travel by private vehicle        Equipment Recommendations  None recommended by PT    Recommendations for Other Services       Precautions / Restrictions Precautions Precautions: Fall;Knee Precaution Booklet Issued: Yes (comment) Precaution Comments: Patient is compliant with HEP Restrictions Weight Bearing Restrictions Per Provider Order: Yes RLE Weight Bearing Per Provider Order: Weight bearing as tolerated     Mobility  Bed Mobility Overal bed mobility: Needs Assistance Bed Mobility: Supine to Sit     Supine to sit: Min assist, HOB elevated     General bed mobility comments: Patient safely demonstrates good bed mobility while using UE support with hands on the head railing, needed assistance to move operative leg to EOB    Transfers Overall transfer level: Needs  assistance Equipment used: Rolling walker (2 wheels) Transfers: Sit to/from Stand Sit to Stand: Contact guard assist           General transfer comment: Patient able to distribute weight through legs (L>R), while using UE support to push up from a sitting position    Ambulation/Gait Ambulation/Gait assistance: Contact guard assist Gait Distance (Feet): 100 Feet Assistive device: Rolling walker (2 wheels) Gait Pattern/deviations: Step-through pattern Gait velocity: reduced     General Gait Details: slowed step-through gait, reduced stance time and terminal knee extension of RLE   Stairs             Wheelchair Mobility     Tilt Bed    Modified Rankin (Stroke Patients Only)       Balance Overall balance assessment: Needs assistance Sitting-balance support: No upper extremity supported, Feet supported Sitting balance-Leahy Scale: Good     Standing balance support: Bilateral upper extremity supported, Reliant on assistive device for balance Standing balance-Leahy Scale: Poor                              Cognition Arousal: Alert Behavior During Therapy: WFL for tasks assessed/performed Overall Cognitive Status: Within Functional Limits for tasks assessed                                          Exercises Total Joint Exercises Ankle Circles/Pumps: AROM, Right, 10 reps Quad Sets: AROM, Right,  10 reps, Supine Short Arc Quad: AROM, Right, 10 reps, Supine Heel Slides: Strengthening, Right, PROM, 10 reps, Supine Hip ABduction/ADduction: AROM, Strengthening, Right, 10 reps Goniometric ROM: -16-80    General Comments        Pertinent Vitals/Pain Pain Assessment Pain Assessment: 0-10 Pain Score: 7  Pain Location: R knee Pain Descriptors / Indicators: Aching, Grimacing Pain Intervention(s): Monitored during session, Ice applied    Home Living                          Prior Function            PT Goals  (current goals can now be found in the care plan section) Acute Rehab PT Goals PT Goal Formulation: With patient Time For Goal Achievement: 10/29/23 Potential to Achieve Goals: Good Progress towards PT goals: Progressing toward goals    Frequency    Min 1X/week      PT Plan      Co-evaluation              AM-PAC PT "6 Clicks" Mobility   Outcome Measure  Help needed turning from your back to your side while in a flat bed without using bedrails?: A Little Help needed moving from lying on your back to sitting on the side of a flat bed without using bedrails?: A Little Help needed moving to and from a bed to a chair (including a wheelchair)?: A Little Help needed standing up from a chair using your arms (e.g., wheelchair or bedside chair)?: A Little Help needed to walk in hospital room?: A Little Help needed climbing 3-5 steps with a railing? : A Lot 6 Click Score: 17    End of Session Equipment Utilized During Treatment: Gait belt Activity Tolerance: Patient tolerated treatment well Patient left: in chair;with call bell/phone within reach   PT Visit Diagnosis: Other abnormalities of gait and mobility (R26.89);Muscle weakness (generalized) (M62.81);Pain Pain - Right/Left: Right Pain - part of body: Knee     Time: 0819-0858 PT Time Calculation (min) (ACUTE ONLY): 39 min  Charges:    $Gait Training: 8-22 mins $Therapeutic Exercise: 8-22 mins $Therapeutic Activity: 8-22 mins PT General Charges $$ ACUTE PT VISIT: 1 Visit                     Doreen Beam, SPT   Monifah Freehling 10/26/2023, 2:36 PM

## 2023-10-27 DIAGNOSIS — I10 Essential (primary) hypertension: Secondary | ICD-10-CM | POA: Diagnosis not present

## 2023-10-27 DIAGNOSIS — Z79899 Other long term (current) drug therapy: Secondary | ICD-10-CM | POA: Diagnosis not present

## 2023-10-27 DIAGNOSIS — Z85828 Personal history of other malignant neoplasm of skin: Secondary | ICD-10-CM | POA: Diagnosis not present

## 2023-10-27 DIAGNOSIS — Z87891 Personal history of nicotine dependence: Secondary | ICD-10-CM | POA: Diagnosis not present

## 2023-10-27 DIAGNOSIS — M1711 Unilateral primary osteoarthritis, right knee: Secondary | ICD-10-CM | POA: Diagnosis not present

## 2023-10-27 MED ORDER — OXYCODONE HCL 5 MG PO TABS: 5.0000 mg | ORAL_TABLET | Freq: Four times a day (QID) | ORAL | 0 refills | Status: DC | PRN

## 2023-10-27 MED ORDER — METHOCARBAMOL 500 MG PO TABS: 500.0000 mg | ORAL_TABLET | Freq: Four times a day (QID) | ORAL | 0 refills | Status: DC | PRN

## 2023-10-27 MED ORDER — ASPIRIN 81 MG PO CHEW: 81.0000 mg | CHEWABLE_TABLET | Freq: Two times a day (BID) | ORAL | 0 refills | Status: DC

## 2023-10-27 NOTE — Discharge Summary (Signed)
Patient ID: Kevin Young MRN: [id number] DOB/AGE: [birth year] 67 y.o.  Admit date: 10/25/2023 Discharge date: 10/27/2023  Admission Diagnoses:  Principal Problem:   Unilateral primary osteoarthritis, right knee Active Problems:   Status post total right knee replacement   Discharge Diagnoses:  Same  Past Medical History:  Diagnosis Date   Allergic rhinitis    Anxiety and depression    Basal cell carcinoma 01/2022   Left arm--Derm referral   BPH (benign prostatic hyperplasia)    Nocturia is his primary symptom:  Takes Flomax   Diverticulitis large intestine 2015/16   Recurrent: GI MD Dr. Ewing Schlein.  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   Eustachian tube dysfunction    with recurrent "sinus issues"--ENT (Dr. Pollyann Kennedy) recommended surgery for nasal turbinate reduction and septoplasty--as of 11/03/15)   GERD (gastroesophageal reflux disease)    Hepatic steatosis    History of adenomatous polyp of colon    History of kidney stones    History of pneumonia    Hyperlipidemia    Hypertension    Started med 03/2018   IBS (irritable bowel syndrome)    Inguinal pain, left    Chronic; Dr. [name] to do MRI pelvis w/out contrast 08/2017 but insurance denied it.   Nephrolithiasis 12/23/2014   4mm nonobstructive R renal calc on CT abd/pelv done for diverticulitis. 04/2021 6 mm nonobstructing stone R kidney, slight R renal pelviectasis, no hydronephrosis.   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  male who was admitted 10/25/2023 for operative treatment ofUnilateral primary osteoarthritis, right knee. Patient has severe unremitting pain that affects sleep, daily activities, and work/hobbies. After pre-op clearance the patient was taken to the operating room on 10/25/2023 and underwent  Procedure(s): RIGHT TOTAL KNEE ARTHROPLASTY.    Patient was given perioperative antibiotics:  Anti-infectives (From admission, onward)    Start     Dose/Rate Route Frequency Ordered Stop   10/25/23 0830  ceFAZolin (ANCEF) IVPB 2g/100 mL premix        2 g 200 mL/hr over 30 Minutes Intravenous On call to O.R. 10/25/23 0818 10/25/23 1140        Patient was given sequential compression devices, early ambulation, and chemoprophylaxis to prevent DVT.  Patient benefited maximally from hospital stay and there were no complications.    Recent vital signs: Patient Vitals for the past 24 hrs:  BP Temp Temp src Pulse Resp SpO2  10/27/23 0513 (!) 144/85 98.2 F (36.8 C) Oral 75 18 95 %  10/27/23 0513 (!) 144/85 98.2 F (36.8 C) Oral 77 18 97 %  10/26/23 1959 134/73 98.7 F (37.1 C) Oral 75 18 98 %     Recent laboratory studies:  Recent Labs    10/26/23 0526  WBC 17.6*  HGB 14.7  HCT 42.1  PLT 223  NA 134*  K 4.3  CL 103  CO2 22  BUN 13  CREATININE 0.95  GLUCOSE 153*  CALCIUM 8.8*     Discharge Medications:   Allergies as of 10/27/2023       Reactions   Codeine Itching        Medication List     TAKE these medications    acetaminophen 650 MG CR tablet Commonly known as: TYLENOL Take 1,300 mg by mouth every 8 (eight) hours as needed for pain.   ALIGN PO Take 1 capsule by mouth every morning.   amLODipine-benazepril 10-20 MG capsule Commonly known as: LOTREL Take 1 capsule by mouth daily. What changed: when to take this   aspirin 81 MG chewable tablet Chew 1 tablet (81 mg total) by mouth 2 (two) times daily.   atorvastatin 20 MG tablet Commonly known as: LIPITOR TAKE  1/2 TABLET(10 MG) BY MOUTH DAILY What changed: additional instructions   cetirizine 10 MG tablet Commonly known as: ZYRTEC Take 10 mg by mouth every evening.   chlorhexidine 4 % external liquid Commonly known as: HIBICLENS Apply 15 mLs (1 Application total) topically as directed for 30 doses. Use as directed daily for 5 days every other week for 6 weeks.   Fish Oil 1200 MG Caps Take 1,200-2,400 mg by mouth See admin instructions. Take 2400 mg in the morning and 1200 mg in the evening   Glucosamine-Chondroitin 750-600 MG Tabs Take 2 tablets by mouth daily with lunch.   lidocaine 4 % Apply 1 patch topically daily as needed (pain).   Magnesium 500 MG Tabs Take 1 tablet by mouth at bedtime.   Melatonin 10 MG Tabs Take 10 mg by mouth at bedtime.   METAMUCIL PO Take 1 Scoop by mouth at bedtime.   methocarbamol 500 MG tablet Commonly known as: ROBAXIN Take 1 tablet (500 mg total) by mouth every 6 (six) hours as needed for muscle spasms.   MIRALAX PO Take 17 g by mouth daily.   multivitamin with minerals tablet Take 1 tablet by mouth daily.   mupirocin ointment 2 % Commonly known as: BACTROBAN Place 1 Application into the nose 2 (two) times daily for 60 doses. Use as directed 2 times daily for 5 days every other week for 6 weeks.   NYQUIL COLD & FLU PO Take 1 Dose by mouth daily as needed (congestion/ cold symptoms).   OVER THE COUNTER MEDICATION Take 2 tablets by mouth daily. Super Beta Prostate   oxyCODONE 5 MG immediate release tablet Commonly known as: Oxy IR/ROXICODONE Take 1-2 tablets (5-10 mg total) by mouth every 6 (six) hours as needed for moderate pain (pain score 4-6) (pain score 4-6).   oxymetazoline 0.05 % nasal spray Commonly known as: AFRIN Place 1 spray into both nostrils at bedtime as needed for congestion.               Durable Medical Equipment  (From admission, onward)           Start     Ordered   10/25/23 1609  DME 3 n 1  Once         10/25/23 1608   10/25/23 1609  DME Walker rolling  Once       Question Answer Comment  Walker: With 5 Inch Wheels   Patient needs a walker to treat with the following condition Status post total right knee replacement      10/25/23 1608            Diagnostic Studies: DG Knee Right Port Result Date: 10/25/2023 CLINICAL DATA:  Status post right knee replacement, in  PACU. EXAM: PORTABLE RIGHT KNEE - 1-2 VIEW COMPARISON:  None Available. FINDINGS: Right knee arthroplasty in expected alignment. No periprosthetic lucency or fracture. Recent postsurgical change includes air and edema in the soft tissues and joint space. Anterior skin staples in place. IMPRESSION: Right knee arthroplasty without immediate postoperative complication. Electronically Signed   By: Narda Rutherford M.D.   On: 10/25/2023 14:49    Disposition: Discharge disposition: 01-Home or Self Care          Follow-up Information     Kathryne Hitch, MD Follow up in 2 week(s).   Specialty: Orthopedic Surgery Contact information: [address] [place] [address] [phone]                  Signed: Kathryne Hitch 10/27/2023, 1:34 PM

## 2023-10-27 NOTE — Progress Notes (Signed)
Physical Therapy Treatment and Discharge  Patient Details Name: Kevin Young MRN: [id number] DOB: 11/10/1955 Today's Date: 10/27/2023   History of Present Illness 68 y.o. male presents to [facility] hospital on 10/25/2023 for elective R TKA. PMH includes HLD, depressive disorder, IBS, OSA, HTN.    PT Comments  Patient was willing to participate with therapy today. The patient expressed some hesitancy with being discharged home due to not feeling independent enough. The patient was reassured that his progress with acute PT has been trending in the right direction, in terms of functional independence. The patient was educated on his remaining impairments and the recovery process following his recent procedure. The patient  is compliant with his HEP and was able to perform each exercise in supine and sitting. The patient was Mod I when transitioning from supine> sitting, safely demonstrating good bed mobility while using UE support with hands on the head railing and  hooking his non-operative leg under his operative leg to transition EOB. Patient was also Mod I for sit > stand transfer. Patient was Mod I during ambulation and was able to walk 246ft within the unit, demonstrating a step to pattern consistently and requiring verbal cues to demonstrate a step-through pattern. Patient will benefit from HHPT or OPPT to further maximize functional independence.    If plan is discharge home, recommend the following: A little help with bathing/dressing/bathroom;Assistance with cooking/housework;Assist for transportation;Help with stairs or ramp for entrance   Can travel by private vehicle        Equipment Recommendations  None recommended by PT    Recommendations for Other Services       Precautions / Restrictions Precautions Precautions: Fall;Knee Precaution Booklet Issued: Yes (comment) Precaution Comments: Patient is compliant with HEP Restrictions Weight Bearing Restrictions Per Provider Order:  Yes RLE Weight Bearing Per Provider Order: Weight bearing as tolerated     Mobility  Bed Mobility Overal bed mobility: Modified Independent Bed Mobility: Supine to Sit     Supine to sit: Modified independent (Device/Increase time), HOB elevated     General bed mobility comments: Patient safely demonstrates good bed mobility while using a UE support with hands on the head railing, utilzed hooking his non-operative leg under his operative leg to transition EOB    Transfers Overall transfer level: Modified independent Equipment used: Rolling walker (2 wheels) Transfers: Sit to/from Stand Sit to Stand: Modified independent (Device/Increase time)           General transfer comment: Patient able to distribute weight through legs (L>R), while one hand was on the RW and the other hand assisted in pushing up from a seated position    Ambulation/Gait Ambulation/Gait assistance: Modified independent (Device/Increase time) Gait Distance (Feet): 200 Feet Assistive device: Rolling walker (2 wheels) Gait Pattern/deviations: Step-to pattern, Step-through pattern Gait velocity: reduced     General Gait Details: Slowed step-to pattern, inconsistently demonstrates step-through pattern. Cues given to improve heel to toe contact   Stairs Stairs: Yes Stairs assistance: Modified independent (Device/Increase time) Stair Management: One rail Right, Backwards, Forwards Number of Stairs: 2 General stair comments: Ascended steps going backwards w/ RW in front, descended steps going forward w/ RW in front. Cues given for sequencing and having secondary help to manage RW   Wheelchair Mobility     Tilt Bed    Modified Rankin (Stroke Patients Only)       Balance Overall balance assessment: Modified Independent Sitting-balance support: No upper extremity supported, Feet supported Sitting balance-Leahy Scale: Good  Standing balance support: Bilateral upper extremity supported,  Reliant on assistive device for balance Standing balance-Leahy Scale: Poor                              Cognition Arousal: Alert Behavior During Therapy: WFL for tasks assessed/performed Overall Cognitive Status: Within Functional Limits for tasks assessed                                 General Comments: Reports feeling depressed about going home and not knowing if he'll be able to be fully independent.        Exercises Total Joint Exercises Ankle Circles/Pumps: AROM, Right, 10 reps Quad Sets: AROM, Right, 10 reps, Supine Short Arc Quad: AROM, Right, 10 reps, Supine Heel Slides: Strengthening, Right, PROM, 10 reps, Supine Hip ABduction/ADduction: AROM, Strengthening, Right, 10 reps    General Comments        Pertinent Vitals/Pain Pain Assessment Pain Assessment: 0-10 Pain Score: 7  Pain Location: R knee Pain Descriptors / Indicators: Aching, Grimacing Pain Intervention(s): Monitored during session, Ice applied    Home Living                          Prior Function            PT Goals (current goals can now be found in the care plan section) Acute Rehab PT Goals Patient Stated Goal: to return to independence PT Goal Formulation: With patient Time For Goal Achievement: 10/29/23 Potential to Achieve Goals: Good Progress towards PT goals: Goals met/education completed, patient discharged from PT    Frequency    Min 1X/week      PT Plan      Co-evaluation              AM-PAC PT "6 Clicks" Mobility   Outcome Measure  Help needed turning from your back to your side while in a flat bed without using bedrails?: None Help needed moving from lying on your back to sitting on the side of a flat bed without using bedrails?: None Help needed moving to and from a bed to a chair (including a wheelchair)?: None Help needed standing up from a chair using your arms (e.g., wheelchair or bedside chair)?: None Help needed to walk  in hospital room?: None Help needed climbing 3-5 steps with a railing? : None 6 Click Score: 24    End of Session Equipment Utilized During Treatment: Gait belt Activity Tolerance: Patient tolerated treatment well Patient left: in chair;with call bell/phone within reach   PT Visit Diagnosis: Other abnormalities of gait and mobility (R26.89);Muscle weakness (generalized) (M62.81);Pain Pain - Right/Left: Right Pain - part of body: Knee     Time: 6160-7371 PT Time Calculation (min) (ACUTE ONLY): 29 min  Charges:    $Gait Training: 8-22 mins $Therapeutic Activity: 8-22 mins PT General Charges $$ ACUTE PT VISIT: 1 Visit                     Doreen Beam, SPT   Becket Wecker 10/27/2023, 11:34 AM

## 2023-10-27 NOTE — Progress Notes (Signed)
Patient ID: Kevin Young, male   DOB: [birth year], 68 y.o.   MRN: [id number] The patient is awake and alert this morning.  His vital signs are stable.  His right operative knee is also stable.  The dressing is clean and dry.  We talked in length in detail about him continuing therapy today with going home later this afternoon.  We talked about what to expect with his postoperative course once he is home.  We will put in for discharge this afternoon.

## 2023-10-27 NOTE — TOC Transition Note (Signed)
Transition of Care [facility]) - Discharge Note   Patient Details  Name: Kevin Young MRN: [id number] Date of Birth: 1956-09-17  Transition of Care [facility]) CM/SW Contact:  Epifanio Lesches, RN Phone Number: 10/27/2023, 10:44 AM   Clinical Narrative:    Patient will DC to: home Anticipated DC date: 10/27/2023 Family notified: yes Transport by: car      - S/P Right total knee arthroplasty  Per MD patient ready for DC today pending therapy clearance. RN, patient, patient's family, and  [facility] aware of DC. Pt states already has needed DME. Pt without RX meds or transportation issues. Post hospital f/u noted on AVS. RNCM will sign off for now as intervention is no longer needed. Please consult [place] again if new needs arise.    Final next level of care: Home w Home Health Services Barriers to Discharge: No Barriers Identified   Patient Goals and CMS Choice     Choice offered to / list presented to : Patient      Discharge Placement                       Discharge Plan and Services Additional resources added to the After Visit Summary for     Discharge Planning Services: CM Consult                      HH Arranged: PT [facility] Agency: [facility] Date [facility] Agency Contacted: 10/26/23 Time HH Agency Contacted: 1611 Representative spoke with at [facility] Agency: Amy  Social Drivers of Health (SDOH) Interventions SDOH Screenings   Food Insecurity: No Food Insecurity (10/25/2023)  Housing: Low Risk  (10/25/2023)  Transportation Needs: No Transportation Needs (10/25/2023)  Utilities: Not At Risk (10/25/2023)  Alcohol Screen: Low Risk  (07/20/2023)  Depression (PHQ2-9): Low Risk  (07/21/2023)  Financial Resource Strain: Low Risk  (07/20/2023)  Physical Activity: Sufficiently Active (07/20/2023)  Social Connections: Moderately Isolated (10/25/2023)  Stress: Stress Concern Present (07/20/2023)  Tobacco Use: Medium Risk (10/25/2023)     Readmission Risk  Interventions     No data to display

## 2023-10-27 NOTE — Plan of Care (Signed)
  Problem: Acute Rehab PT Goals(only PT should resolve) Goal: Pt Will Go Supine/Side To Sit Outcome: Completed/Met Goal: Patient Will Transfer Sit To/From Stand Outcome: Completed/Met Goal: Pt Will Ambulate Outcome: Completed/Met Goal: Pt Will Go Up/Down Stairs Outcome: Completed/Met Goal: Pt/caregiver will Perform Home Exercise Program Outcome: Completed/Met

## 2023-10-27 NOTE — Plan of Care (Signed)

## 2023-10-27 NOTE — Progress Notes (Signed)
Verbal and written discharge instructions including AVS given to Kevin Young and Kevin Young who is pt uncle. Kevin Young able to teach back reasons to contact MD, pt aware of follow up appointments, prescriptions, and signs and symptoms of infections to right surgical site, pt aware of not submerging in water, or putting lotions, ointments on. Kevin Young verbalized understanding of all education and all personal belongings taken. Ice man taken with instructions given on use. Incentive spirometer taken with instructions on use, VTE medications reviewed with s/s of reasons to call 911. Wheelchair to lobby for DC

## 2023-10-28 DIAGNOSIS — Z96651 Presence of right artificial knee joint: Secondary | ICD-10-CM | POA: Diagnosis not present

## 2023-10-28 DIAGNOSIS — Z471 Aftercare following joint replacement surgery: Secondary | ICD-10-CM | POA: Diagnosis not present

## 2023-11-01 DIAGNOSIS — G8918 Other acute postprocedural pain: Secondary | ICD-10-CM | POA: Diagnosis not present

## 2023-11-01 DIAGNOSIS — Z471 Aftercare following joint replacement surgery: Secondary | ICD-10-CM | POA: Diagnosis not present

## 2023-11-01 DIAGNOSIS — M1711 Unilateral primary osteoarthritis, right knee: Secondary | ICD-10-CM | POA: Diagnosis not present

## 2023-11-01 DIAGNOSIS — Z96651 Presence of right artificial knee joint: Secondary | ICD-10-CM | POA: Diagnosis not present

## 2023-11-03 ENCOUNTER — Telehealth: Payer: Self-pay | Admitting: *Deleted

## 2023-11-03 ENCOUNTER — Telehealth: Payer: Self-pay | Admitting: Orthopaedic Surgery

## 2023-11-03 ENCOUNTER — Other Ambulatory Visit: Payer: Self-pay | Admitting: Orthopaedic Surgery

## 2023-11-03 DIAGNOSIS — Z471 Aftercare following joint replacement surgery: Secondary | ICD-10-CM | POA: Diagnosis not present

## 2023-11-03 DIAGNOSIS — Z96651 Presence of right artificial knee joint: Secondary | ICD-10-CM | POA: Diagnosis not present

## 2023-11-03 MED ORDER — OXYCODONE HCL 5 MG PO TABS: 5.0000 mg | ORAL_TABLET | Freq: Four times a day (QID) | ORAL | 0 refills | Status: DC | PRN

## 2023-11-03 NOTE — Telephone Encounter (Signed)
Kevin Young (PT) from [facility] called with verbals for 1wk1, 3wk 1, and 2wk1. [place] secure number is [phone].

## 2023-11-03 NOTE — Telephone Encounter (Signed)
Spoke with patient and he will need a refill of pain medication over the weekend. Pharmacy-[facility] [place]. Thank you.

## 2023-11-04 DIAGNOSIS — Z96651 Presence of right artificial knee joint: Secondary | ICD-10-CM | POA: Diagnosis not present

## 2023-11-04 DIAGNOSIS — Z471 Aftercare following joint replacement surgery: Secondary | ICD-10-CM | POA: Diagnosis not present

## 2023-11-07 ENCOUNTER — Ambulatory Visit (INDEPENDENT_AMBULATORY_CARE_PROVIDER_SITE_OTHER): Payer: Medicare Other | Admitting: Orthopaedic Surgery

## 2023-11-07 ENCOUNTER — Encounter: Payer: Self-pay | Admitting: Orthopaedic Surgery

## 2023-11-07 DIAGNOSIS — Z471 Aftercare following joint replacement surgery: Secondary | ICD-10-CM | POA: Diagnosis not present

## 2023-11-07 DIAGNOSIS — Z96651 Presence of right artificial knee joint: Secondary | ICD-10-CM

## 2023-11-07 NOTE — Progress Notes (Signed)
The patient is here today for his first postoperative visit 2 weeks out from a right total knee replacement.  He is 68 years old.  He has been compliant with a baby aspirin twice a day.  He reports improved range of motion and strength.  He has been compliant also wearing a knee immobilizer at bedtime.  On exam his calf is soft.  He can go back down to just 1 aspirin daily.  He lacks full extension by few degrees and I can flex him to about 80 degrees.  The incision looks good.  Staples are removed and Steri-Strips applied.  We will see him back in a month from now to see how he is doing from a range of motion standpoint.  No x-rays will be needed.  We will continue his pain medicines as well when he runs low.

## 2023-11-08 ENCOUNTER — Telehealth: Payer: Self-pay

## 2023-11-08 NOTE — Telephone Encounter (Signed)
 Copied and pasted CRM  Reason for CRM: Sharise the Physcial Thearpist with [facility] stated that the pt was discharged yesterday and his assessment for depression showed 10 which means moderate depression. She wanted to inform the PCP of the results. Callback [phone]

## 2023-11-09 DIAGNOSIS — M25561 Pain in right knee: Secondary | ICD-10-CM | POA: Diagnosis not present

## 2023-11-09 DIAGNOSIS — Z96651 Presence of right artificial knee joint: Secondary | ICD-10-CM | POA: Diagnosis not present

## 2023-11-09 DIAGNOSIS — M25461 Effusion, right knee: Secondary | ICD-10-CM | POA: Diagnosis not present

## 2023-11-09 NOTE — Telephone Encounter (Signed)
 noted

## 2023-11-10 ENCOUNTER — Telehealth: Payer: Self-pay | Admitting: *Deleted

## 2023-11-10 ENCOUNTER — Other Ambulatory Visit: Payer: Self-pay | Admitting: Orthopaedic Surgery

## 2023-11-10 MED ORDER — OXYCODONE HCL 5 MG PO TABS: 5.0000 mg | ORAL_TABLET | Freq: Four times a day (QID) | ORAL | 0 refills | Status: DC | PRN

## 2023-11-10 NOTE — Telephone Encounter (Signed)
 Patient called and requested refill of pain medication before the weekend. Thank you.

## 2023-11-11 DIAGNOSIS — M25561 Pain in right knee: Secondary | ICD-10-CM | POA: Diagnosis not present

## 2023-11-11 DIAGNOSIS — Z96651 Presence of right artificial knee joint: Secondary | ICD-10-CM | POA: Diagnosis not present

## 2023-11-11 DIAGNOSIS — M25461 Effusion, right knee: Secondary | ICD-10-CM | POA: Diagnosis not present

## 2023-11-14 DIAGNOSIS — M25461 Effusion, right knee: Secondary | ICD-10-CM | POA: Diagnosis not present

## 2023-11-14 DIAGNOSIS — Z96651 Presence of right artificial knee joint: Secondary | ICD-10-CM | POA: Diagnosis not present

## 2023-11-14 DIAGNOSIS — M25561 Pain in right knee: Secondary | ICD-10-CM | POA: Diagnosis not present

## 2023-11-16 ENCOUNTER — Telehealth: Payer: Self-pay | Admitting: *Deleted

## 2023-11-16 ENCOUNTER — Other Ambulatory Visit: Payer: Self-pay | Admitting: Orthopaedic Surgery

## 2023-11-16 DIAGNOSIS — M25461 Effusion, right knee: Secondary | ICD-10-CM | POA: Diagnosis not present

## 2023-11-16 DIAGNOSIS — Z96651 Presence of right artificial knee joint: Secondary | ICD-10-CM | POA: Diagnosis not present

## 2023-11-16 DIAGNOSIS — M25561 Pain in right knee: Secondary | ICD-10-CM | POA: Diagnosis not present

## 2023-11-16 MED ORDER — OXYCODONE HCL 5 MG PO TABS
5.0000 mg | ORAL_TABLET | Freq: Four times a day (QID) | ORAL | 0 refills | Status: DC | PRN
Start: 2023-11-16 — End: 2023-11-23

## 2023-11-16 NOTE — Telephone Encounter (Signed)
Patient called requesting refill of pain medication. Thank you.

## 2023-11-22 DIAGNOSIS — Z96651 Presence of right artificial knee joint: Secondary | ICD-10-CM | POA: Diagnosis not present

## 2023-11-22 DIAGNOSIS — M25561 Pain in right knee: Secondary | ICD-10-CM | POA: Diagnosis not present

## 2023-11-22 DIAGNOSIS — M25461 Effusion, right knee: Secondary | ICD-10-CM | POA: Diagnosis not present

## 2023-11-23 ENCOUNTER — Other Ambulatory Visit: Payer: Self-pay | Admitting: Orthopaedic Surgery

## 2023-11-23 ENCOUNTER — Telehealth: Payer: Self-pay | Admitting: *Deleted

## 2023-11-23 MED ORDER — OXYCODONE HCL 5 MG PO TABS
5.0000 mg | ORAL_TABLET | Freq: Four times a day (QID) | ORAL | 0 refills | Status: DC | PRN
Start: 2023-11-23 — End: 2023-11-30

## 2023-11-23 NOTE — Telephone Encounter (Signed)
Patient would like refill of pain medication. Only using sparingly. He asked about driving. He is only 4 weeks post op and I explained your normal info about not using pain medication while driving as well as practicing and being able to press on brake quickly. He lives alone and states his rides to therapy are not very reliable. I told him to use his best judgment and practice first. Thank you.

## 2023-11-24 DIAGNOSIS — Z96651 Presence of right artificial knee joint: Secondary | ICD-10-CM | POA: Diagnosis not present

## 2023-11-24 DIAGNOSIS — M25461 Effusion, right knee: Secondary | ICD-10-CM | POA: Diagnosis not present

## 2023-11-24 DIAGNOSIS — M25561 Pain in right knee: Secondary | ICD-10-CM | POA: Diagnosis not present

## 2023-11-29 DIAGNOSIS — Z96651 Presence of right artificial knee joint: Secondary | ICD-10-CM | POA: Diagnosis not present

## 2023-11-29 DIAGNOSIS — M25561 Pain in right knee: Secondary | ICD-10-CM | POA: Diagnosis not present

## 2023-11-29 DIAGNOSIS — M25461 Effusion, right knee: Secondary | ICD-10-CM | POA: Diagnosis not present

## 2023-11-30 ENCOUNTER — Telehealth: Payer: Self-pay | Admitting: *Deleted

## 2023-11-30 ENCOUNTER — Other Ambulatory Visit: Payer: Self-pay | Admitting: Orthopaedic Surgery

## 2023-11-30 MED ORDER — METHOCARBAMOL 500 MG PO TABS
500.0000 mg | ORAL_TABLET | Freq: Four times a day (QID) | ORAL | 0 refills | Status: AC | PRN
Start: 2023-11-30 — End: ?

## 2023-11-30 MED ORDER — OXYCODONE HCL 5 MG PO TABS
5.0000 mg | ORAL_TABLET | Freq: Four times a day (QID) | ORAL | 0 refills | Status: DC | PRN
Start: 2023-11-30 — End: 2023-12-05

## 2023-11-30 NOTE — Telephone Encounter (Signed)
 Patient called and said he is really made some progress with therapy this week. Would like a refill of pain medication and muscle relaxer. Thank you.

## 2023-12-01 DIAGNOSIS — M25561 Pain in right knee: Secondary | ICD-10-CM | POA: Diagnosis not present

## 2023-12-01 DIAGNOSIS — Z96651 Presence of right artificial knee joint: Secondary | ICD-10-CM | POA: Diagnosis not present

## 2023-12-01 DIAGNOSIS — M25461 Effusion, right knee: Secondary | ICD-10-CM | POA: Diagnosis not present

## 2023-12-05 ENCOUNTER — Encounter: Payer: Self-pay | Admitting: Orthopaedic Surgery

## 2023-12-05 ENCOUNTER — Ambulatory Visit (INDEPENDENT_AMBULATORY_CARE_PROVIDER_SITE_OTHER): Payer: Medicare Other | Admitting: Orthopaedic Surgery

## 2023-12-05 DIAGNOSIS — Z96651 Presence of right artificial knee joint: Secondary | ICD-10-CM

## 2023-12-05 MED ORDER — HYDROCODONE-ACETAMINOPHEN 5-325 MG PO TABS: 1.0000 | ORAL_TABLET | Freq: Four times a day (QID) | ORAL | 0 refills | Status: DC | PRN

## 2023-12-05 NOTE — Progress Notes (Signed)
 The patient is now 6 weeks status post a right total knee arthroplasty.  He said that he was able to flex to 105 degrees last week.  He is getting better he states and PT with [facility] physical therapy is working with him.  He does ambulate with a cane.  He has been on oxycodone and would like to have 1 more refill but at this point I would like to wean him to hydrocodone and he understands that as well.  Examination of his right knee today shows extension is almost full and his flexion is to past 90 degrees.  The incision looks good.  There is swelling to be expected.  He will continue outpatient physical therapy.  I will like to see him back in 4 weeks for repeat exam but no x-rays are needed.  If he looks good at that visit we would not see him back in 3 months.

## 2023-12-08 DIAGNOSIS — Z96651 Presence of right artificial knee joint: Secondary | ICD-10-CM | POA: Diagnosis not present

## 2023-12-08 DIAGNOSIS — M25561 Pain in right knee: Secondary | ICD-10-CM | POA: Diagnosis not present

## 2023-12-08 DIAGNOSIS — M25461 Effusion, right knee: Secondary | ICD-10-CM | POA: Diagnosis not present

## 2023-12-13 DIAGNOSIS — M25561 Pain in right knee: Secondary | ICD-10-CM | POA: Diagnosis not present

## 2023-12-13 DIAGNOSIS — Z96651 Presence of right artificial knee joint: Secondary | ICD-10-CM | POA: Diagnosis not present

## 2023-12-13 DIAGNOSIS — M25461 Effusion, right knee: Secondary | ICD-10-CM | POA: Diagnosis not present

## 2023-12-15 DIAGNOSIS — Z96651 Presence of right artificial knee joint: Secondary | ICD-10-CM | POA: Diagnosis not present

## 2023-12-15 DIAGNOSIS — M25561 Pain in right knee: Secondary | ICD-10-CM | POA: Diagnosis not present

## 2023-12-15 DIAGNOSIS — M25461 Effusion, right knee: Secondary | ICD-10-CM | POA: Diagnosis not present

## 2023-12-20 ENCOUNTER — Other Ambulatory Visit: Payer: Self-pay | Admitting: Orthopaedic Surgery

## 2023-12-20 ENCOUNTER — Telehealth: Payer: Self-pay | Admitting: *Deleted

## 2023-12-20 DIAGNOSIS — M25461 Effusion, right knee: Secondary | ICD-10-CM | POA: Diagnosis not present

## 2023-12-20 DIAGNOSIS — Z96651 Presence of right artificial knee joint: Secondary | ICD-10-CM | POA: Diagnosis not present

## 2023-12-20 DIAGNOSIS — M25561 Pain in right knee: Secondary | ICD-10-CM | POA: Diagnosis not present

## 2023-12-20 MED ORDER — HYDROCODONE-ACETAMINOPHEN 5-325 MG PO TABS: 1.0000 | ORAL_TABLET | Freq: Three times a day (TID) | ORAL | 0 refills | Status: DC | PRN

## 2023-12-20 NOTE — Telephone Encounter (Signed)
 Patient called and said he is still needing his Hydrocodone at night. Would like an additional refill if possible. Thank you.

## 2023-12-22 DIAGNOSIS — M25561 Pain in right knee: Secondary | ICD-10-CM | POA: Diagnosis not present

## 2023-12-22 DIAGNOSIS — Z96651 Presence of right artificial knee joint: Secondary | ICD-10-CM | POA: Diagnosis not present

## 2023-12-22 DIAGNOSIS — M25461 Effusion, right knee: Secondary | ICD-10-CM | POA: Diagnosis not present

## 2023-12-27 DIAGNOSIS — M25461 Effusion, right knee: Secondary | ICD-10-CM | POA: Diagnosis not present

## 2023-12-27 DIAGNOSIS — M25561 Pain in right knee: Secondary | ICD-10-CM | POA: Diagnosis not present

## 2023-12-27 DIAGNOSIS — Z96651 Presence of right artificial knee joint: Secondary | ICD-10-CM | POA: Diagnosis not present

## 2023-12-29 DIAGNOSIS — M25561 Pain in right knee: Secondary | ICD-10-CM | POA: Diagnosis not present

## 2023-12-29 DIAGNOSIS — M25461 Effusion, right knee: Secondary | ICD-10-CM | POA: Diagnosis not present

## 2023-12-29 DIAGNOSIS — Z96651 Presence of right artificial knee joint: Secondary | ICD-10-CM | POA: Diagnosis not present

## 2024-01-02 ENCOUNTER — Other Ambulatory Visit: Payer: Self-pay | Admitting: Physician Assistant

## 2024-01-02 ENCOUNTER — Ambulatory Visit (INDEPENDENT_AMBULATORY_CARE_PROVIDER_SITE_OTHER): Admitting: Physician Assistant

## 2024-01-02 ENCOUNTER — Encounter: Payer: Self-pay | Admitting: Physician Assistant

## 2024-01-02 DIAGNOSIS — Z96651 Presence of right artificial knee joint: Secondary | ICD-10-CM

## 2024-01-02 MED ORDER — HYDROCODONE-ACETAMINOPHEN 5-325 MG PO TABS
1.0000 | ORAL_TABLET | Freq: Four times a day (QID) | ORAL | 0 refills | Status: DC | PRN
Start: 2024-01-02 — End: 2024-01-13

## 2024-01-02 NOTE — Progress Notes (Signed)
 HPI: [name] returns today status post right total knee arthroplasty 10/25/2023.  Overall doing well.  He does have an aching sensation throughout the knee but feels like he is trending towards improvement.  Continues formal therapy.  No fevers chills.  Taking periodic hydrocodone mostly after therapy and at night.  Occasional Robaxin.  Physical exam: General: Well-developed well-nourished male who ambulates with a nonantalgic gait.  No assistive device. Right knee full extension full flexion to the 115 to 120 degrees.  No instability valgus varus stressing.  Surgical incisions well-healed.  Calf supple nontender.  Dorsiflexion plantarflexion right ankle intact.  Impression: Status post right total knee arthroplasty  Plan: He will continue to work on scar tissue mobilization.  Follow-up with [place] in 3 months at that time we will obtain an AP and lateral view of the right knee.  Refilled his hydrocodone today.  Questions were encouraged and answered at length.

## 2024-01-03 DIAGNOSIS — M25561 Pain in right knee: Secondary | ICD-10-CM | POA: Diagnosis not present

## 2024-01-03 DIAGNOSIS — M25461 Effusion, right knee: Secondary | ICD-10-CM | POA: Diagnosis not present

## 2024-01-03 DIAGNOSIS — Z96651 Presence of right artificial knee joint: Secondary | ICD-10-CM | POA: Diagnosis not present

## 2024-01-05 DIAGNOSIS — M25561 Pain in right knee: Secondary | ICD-10-CM | POA: Diagnosis not present

## 2024-01-05 DIAGNOSIS — Z96651 Presence of right artificial knee joint: Secondary | ICD-10-CM | POA: Diagnosis not present

## 2024-01-05 DIAGNOSIS — M25461 Effusion, right knee: Secondary | ICD-10-CM | POA: Diagnosis not present

## 2024-01-10 DIAGNOSIS — M25461 Effusion, right knee: Secondary | ICD-10-CM | POA: Diagnosis not present

## 2024-01-10 DIAGNOSIS — M25561 Pain in right knee: Secondary | ICD-10-CM | POA: Diagnosis not present

## 2024-01-10 DIAGNOSIS — Z96651 Presence of right artificial knee joint: Secondary | ICD-10-CM | POA: Diagnosis not present

## 2024-01-12 DIAGNOSIS — M25561 Pain in right knee: Secondary | ICD-10-CM | POA: Diagnosis not present

## 2024-01-12 DIAGNOSIS — Z96651 Presence of right artificial knee joint: Secondary | ICD-10-CM | POA: Diagnosis not present

## 2024-01-12 DIAGNOSIS — M25461 Effusion, right knee: Secondary | ICD-10-CM | POA: Diagnosis not present

## 2024-01-13 ENCOUNTER — Other Ambulatory Visit: Payer: Self-pay | Admitting: Orthopaedic Surgery

## 2024-01-13 ENCOUNTER — Telehealth: Payer: Self-pay | Admitting: *Deleted

## 2024-01-13 MED ORDER — HYDROCODONE-ACETAMINOPHEN 5-325 MG PO TABS: 1.0000 | ORAL_TABLET | Freq: Three times a day (TID) | ORAL | 0 refills | Status: DC | PRN

## 2024-01-13 NOTE — Telephone Encounter (Signed)
 Patient called and would like another refill of pain medication if possible. Taking Norco. Using for sleep and still attending OPPT. Thank you.

## 2024-01-17 DIAGNOSIS — Z96651 Presence of right artificial knee joint: Secondary | ICD-10-CM | POA: Diagnosis not present

## 2024-01-17 DIAGNOSIS — M25561 Pain in right knee: Secondary | ICD-10-CM | POA: Diagnosis not present

## 2024-01-17 DIAGNOSIS — M25461 Effusion, right knee: Secondary | ICD-10-CM | POA: Diagnosis not present

## 2024-01-19 DIAGNOSIS — Z96651 Presence of right artificial knee joint: Secondary | ICD-10-CM | POA: Diagnosis not present

## 2024-01-19 DIAGNOSIS — M25561 Pain in right knee: Secondary | ICD-10-CM | POA: Diagnosis not present

## 2024-01-19 DIAGNOSIS — M25461 Effusion, right knee: Secondary | ICD-10-CM | POA: Diagnosis not present

## 2024-01-22 ENCOUNTER — Other Ambulatory Visit: Payer: Self-pay | Admitting: Family Medicine

## 2024-01-25 DIAGNOSIS — Z96651 Presence of right artificial knee joint: Secondary | ICD-10-CM | POA: Diagnosis not present

## 2024-01-25 DIAGNOSIS — M25561 Pain in right knee: Secondary | ICD-10-CM | POA: Diagnosis not present

## 2024-01-25 DIAGNOSIS — M25461 Effusion, right knee: Secondary | ICD-10-CM | POA: Diagnosis not present

## 2024-01-26 ENCOUNTER — Encounter: Payer: Self-pay | Admitting: Family Medicine

## 2024-01-26 ENCOUNTER — Ambulatory Visit (INDEPENDENT_AMBULATORY_CARE_PROVIDER_SITE_OTHER): Payer: Medicare Other | Admitting: Family Medicine

## 2024-01-26 VITALS — BP 149/73 | HR 93 | Ht 66.0 in | Wt 231.8 lb

## 2024-01-26 DIAGNOSIS — Z125 Encounter for screening for malignant neoplasm of prostate: Secondary | ICD-10-CM | POA: Diagnosis not present

## 2024-01-26 DIAGNOSIS — R7303 Prediabetes: Secondary | ICD-10-CM | POA: Diagnosis not present

## 2024-01-26 DIAGNOSIS — Z0000 Encounter for general adult medical examination without abnormal findings: Secondary | ICD-10-CM

## 2024-01-26 DIAGNOSIS — E7800 Pure hypercholesterolemia, unspecified: Secondary | ICD-10-CM | POA: Diagnosis not present

## 2024-01-26 DIAGNOSIS — I10 Essential (primary) hypertension: Secondary | ICD-10-CM | POA: Diagnosis not present

## 2024-01-26 LAB — PSA, MEDICARE: PSA: 0.88 ng/mL (ref 0.10–4.00)

## 2024-01-26 LAB — CBC WITH DIFFERENTIAL/PLATELET
Basophils Absolute: 0 10*3/uL (ref 0.0–0.1)
Basophils Relative: 0.4 % (ref 0.0–3.0)
Eosinophils Absolute: 0.1 10*3/uL (ref 0.0–0.7)
Eosinophils Relative: 2 % (ref 0.0–5.0)
HCT: 43.8 % (ref 39.0–52.0)
Hemoglobin: 14.6 g/dL (ref 13.0–17.0)
Lymphocytes Relative: 32 % (ref 12.0–46.0)
Lymphs Abs: 2.3 10*3/uL (ref 0.7–4.0)
MCHC: 33.4 g/dL (ref 30.0–36.0)
MCV: 91.4 fl (ref 78.0–100.0)
Monocytes Absolute: 0.8 10*3/uL (ref 0.1–1.0)
Monocytes Relative: 10.7 % (ref 3.0–12.0)
Neutro Abs: 3.9 10*3/uL (ref 1.4–7.7)
Neutrophils Relative %: 54.9 % (ref 43.0–77.0)
Platelets: 216 10*3/uL (ref 150.0–400.0)
RBC: 4.79 Mil/uL (ref 4.22–5.81)
RDW: 14.2 % (ref 11.5–15.5)
WBC: 7 10*3/uL (ref 4.0–10.5)

## 2024-01-26 LAB — COMPREHENSIVE METABOLIC PANEL WITH GFR
ALT: 28 U/L (ref 0–53)
AST: 27 U/L (ref 0–37)
Albumin: 4.2 g/dL (ref 3.5–5.2)
Alkaline Phosphatase: 58 U/L (ref 39–117)
BUN: 13 mg/dL (ref 6–23)
CO2: 30 meq/L (ref 19–32)
Calcium: 9.5 mg/dL (ref 8.4–10.5)
Chloride: 104 meq/L (ref 96–112)
Creatinine, Ser: 0.93 mg/dL (ref 0.40–1.50)
GFR: 85.44 mL/min (ref >60.00)
Glucose, Bld: 120 mg/dL — ABNORMAL HIGH (ref 70–99)
Potassium: 4.6 meq/L (ref 3.5–5.1)
Sodium: 139 meq/L (ref 135–145)
Total Bilirubin: 0.6 mg/dL (ref 0.2–1.2)
Total Protein: 6.5 g/dL (ref 6.0–8.3)

## 2024-01-26 LAB — LIPID PANEL
Cholesterol: 150 mg/dL (ref 0–200)
HDL: 55.5 mg/dL (ref >39.00)
LDL Cholesterol: 77 mg/dL (ref 0–99)
NonHDL: 94.86
Total CHOL/HDL Ratio: 3
Triglycerides: 88 mg/dL (ref 0.0–149.0)
VLDL: 17.6 mg/dL (ref 0.0–40.0)

## 2024-01-26 LAB — HEMOGLOBIN A1C: Hgb A1c MFr Bld: 5.9 % (ref 4.6–6.5)

## 2024-01-26 MED ORDER — ATORVASTATIN CALCIUM 20 MG PO TABS: ORAL_TABLET | ORAL | 3 refills | Status: DC

## 2024-01-26 MED ORDER — AMLODIPINE BESY-BENAZEPRIL HCL 10-20 MG PO CAPS: 1.0000 | ORAL_CAPSULE | Freq: Every day | ORAL | 3 refills | Status: AC

## 2024-01-26 NOTE — Patient Instructions (Signed)
 Health Maintenance, Male  Adopting a healthy lifestyle and getting preventive care are important in promoting health and wellness. Ask your health care provider about:  The right schedule for you to have regular tests and exams.  Things you can do on your own to prevent diseases and keep yourself healthy.  What should I know about diet, weight, and exercise?  Eat a healthy diet    Eat a diet that includes plenty of vegetables, fruits, low-fat dairy products, and lean protein.  Do not eat a lot of foods that are high in solid fats, added sugars, or sodium.  Maintain a healthy weight  Body mass index (BMI) is a measurement that can be used to identify possible weight problems. It estimates body fat based on height and weight. Your health care provider can help determine your BMI and help you achieve or maintain a healthy weight.  Get regular exercise  Get regular exercise. This is one of the most important things you can do for your health. Most adults should:  Exercise for at least 150 minutes each week. The exercise should increase your heart rate and make you sweat (moderate-intensity exercise).  Do strengthening exercises at least twice a week. This is in addition to the moderate-intensity exercise.  Spend less time sitting. Even light physical activity can be beneficial.  Watch cholesterol and blood lipids  Have your blood tested for lipids and cholesterol at 67 years of age, then have this test every 5 years.  You may need to have your cholesterol levels checked more often if:  Your lipid or cholesterol levels are high.  You are older than 67 years of age.  You are at high risk for heart disease.  What should I know about cancer screening?  Many types of cancers can be detected early and may often be prevented. Depending on your health history and family history, you may need to have cancer screening at various ages. This may include screening for:  Colorectal cancer.  Prostate cancer.  Skin cancer.  Lung  cancer.  What should I know about heart disease, diabetes, and high blood pressure?  Blood pressure and heart disease  High blood pressure causes heart disease and increases the risk of stroke. This is more likely to develop in people who have high blood pressure readings or are overweight.  Talk with your health care provider about your target blood pressure readings.  Have your blood pressure checked:  Every 3-5 years if you are 9-95 years of age.  Every year if you are 85 years old or older.  If you are between the ages of 29 and 29 and are a current or former smoker, ask your health care provider if you should have a one-time screening for abdominal aortic aneurysm (AAA).  Diabetes  Have regular diabetes screenings. This checks your fasting blood sugar level. Have the screening done:  Once every three years after age 23 if you are at a normal weight and have a low risk for diabetes.  More often and at a younger age if you are overweight or have a high risk for diabetes.  What should I know about preventing infection?  Hepatitis B  If you have a higher risk for hepatitis B, you should be screened for this virus. Talk with your health care provider to find out if you are at risk for hepatitis B infection.  Hepatitis C  Blood testing is recommended for:  Everyone born from [birth year] through 1964.  Anyone  with known risk factors for hepatitis C.  Sexually transmitted infections (STIs)  You should be screened each year for STIs, including gonorrhea and chlamydia, if:  You are sexually active and are younger than 67 years of age.  You are older than 67 years of age and your health care provider tells you that you are at risk for this type of infection.  Your sexual activity has changed since you were last screened, and you are at increased risk for chlamydia or gonorrhea. Ask your health care provider if you are at risk.  Ask your health care provider about whether you are at high risk for HIV. Your health care provider  may recommend a prescription medicine to help prevent HIV infection. If you choose to take medicine to prevent HIV, you should first get tested for HIV. You should then be tested every 3 months for as long as you are taking the medicine.  Follow these instructions at home:  Alcohol use  Do not drink alcohol if your health care provider tells you not to drink.  If you drink alcohol:  Limit how much you have to 0-2 drinks a day.  Know how much alcohol is in your drink. In the [place] one drink equals one 12 oz bottle of beer (355 mL), one 5 oz glass of wine (148 mL), or one 1 oz glass of hard liquor (44 mL).  Lifestyle  Do not use any products that contain nicotine or tobacco. These products include cigarettes, chewing tobacco, and vaping devices, such as e-cigarettes. If you need help quitting, ask your health care provider.  Do not use street drugs.  Do not share needles.  Ask your health care provider for help if you need support or information about quitting drugs.  General instructions  Schedule regular health, dental, and eye exams.  Stay current with your vaccines.  Tell your health care provider if:  You often feel depressed.  You have ever been abused or do not feel safe at home.  Summary  Adopting a healthy lifestyle and getting preventive care are important in promoting health and wellness.  Follow your health care provider's instructions about healthy diet, exercising, and getting tested or screened for diseases.  Follow your health care provider's instructions on monitoring your cholesterol and blood pressure.  This information is not intended to replace advice given to you by your health care provider. Make sure you discuss any questions you have with your health care provider.  Document Revised: 02/09/2021 Document Reviewed: 02/09/2021  Elsevier Patient Education  2024 [employer].

## 2024-01-26 NOTE — Progress Notes (Signed)
 Office Note 01/26/2024  CC:  Chief Complaint  Patient presents with   Annual Exam    Pt is fasting.     Patient is a 68 y.o. male who is here for annual health maintenance exam and follow-up hypertension, hypercholesterolemia, and prediabetes. A/P as of last visit: " hypertension, well-controlled on amlodipine /benazepril  10/20, 1/day. Electrolytes and creatinine today.   2  Hypercholesterolemia, doing well on atorvastatin  20 mg tab, half per day. Lipid panel and hepatic panel today.   3 prediabetes. Hemoglobin A1c is 5.6% today. Continue efforts at lifestyle changes.   #4 grief reaction.  He is gradually coming along but still struggling. Discussed possibility of medication at this time or in the near future and he will think about it and let me know. Empathic listening today."  INTERIM HX: Janes feels well. His knee rehab went well.  He is walking regularly.  It still swells, as would be expected for a long time after getting the surgery. He occasionally takes Vicodin or Tylenol .  Diet is better.  He has lost some weight since last visit. Home blood pressures consistently less than 130/80.  Past Medical History:  Diagnosis Date   Allergic rhinitis    Anxiety and depression    Basal cell carcinoma 01/2022   Left arm--Derm referral   BPH (benign prostatic hyperplasia)    Nocturia is his primary symptom:  Takes Flomax    Diverticulitis large intestine 2015/16   Recurrent: GI MD Dr. Lavaughn Portland.  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   Eustachian tube dysfunction    with recurrent "sinus issues"--ENT (Dr. Donalee Fruits) recommended surgery for nasal turbinate reduction and septoplasty--as of 11/03/15)   GERD (gastroesophageal reflux disease)    Hepatic steatosis    History of adenomatous polyp of colon    History of kidney stones    History of pneumonia    Hyperlipidemia    Hypertension    Started med 03/2018   IBS (irritable bowel  syndrome)    Inguinal pain, left    Chronic; Dr. Hildy Lowers to do MRI pelvis w/out contrast 08/2017 but insurance denied it.   Nephrolithiasis 12/23/2014   4mm nonobstructive R renal calc on CT abd/pelv done for diverticulitis. 04/2021 6 mm nonobstructing stone R kidney, slight R renal pelviectasis, no hydronephrosis.   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  Recall 07/2017 per Dr. Lavaughn Portland   COLONOSCOPY  08/24/2017   Diverticulosis from transverse colon to sigmoid.  Otherwise normal.  Repeat in 5-10 years for screening/Dr. Lavaughn Portland   ESOPHAGOGASTRODUODENOSCOPY     need specifics from pt   EXTRACORPOREAL SHOCK WAVE LITHOTRIPSY Right 12/10/2022   EXTRACORPOREAL SHOCK WAVE LITHOTRIPSY Right 12/10/2022   Procedure: RIGHT EXTRACORPOREAL SHOCK WAVE LITHOTRIPSY (ESWL);  Surgeon: Lahoma Pigg, MD;  Location: [facility];  Service: Urology;  Laterality: Right;  75 MINUTES NEEDED FOR CASE   HERNIA REPAIR Right 2024   multiple.  Right inguinal repair by Dr. Dorena Gander in 2004.  07/2017 having some muscular/nerve pain in groin--evaluated by Dr. Amada Jun w/u at that time.   INCISIONAL HERNIA REPAIR N/A 04/22/2020   Procedure: HERNIA REPAIR INCISIONAL WITH MESH;  Surgeon: Dorena Gander, MD;  Location: [facility] OR;  Service: General;  Laterality: N/A;   KNEE ARTHROSCOPY Right 2024   meniscectomy   LAPAROSCOPIC SIGMOID COLECTOMY N/A 09/15/2015   Procedure: LAPAROSCOPIC ASSISTED  SIGMOID COLECTOMY;  Surgeon: Dorena Gander, MD;  Location: [facility] OR;  Service: General;  Laterality: N/A;   SUPRA-UMBILICAL HERNIA N/A  07/14/2021   Procedure: SUPRA-UMBILICAL HERNIA REPAIR WITH MESH;  Surgeon: Dorena Gander, MD;  Location: [facility] OR;  Service: General;  Laterality: N/A;   TONSILLECTOMY     TOTAL KNEE ARTHROPLASTY Right 10/25/2023   Procedure: RIGHT TOTAL KNEE ARTHROPLASTY;  Surgeon: Arnie Lao, MD;  Location: [facility] OR;  Service: Orthopedics;  Laterality: Right;    Family History  Problem Relation Age of Onset   GI problems Mother    Cancer Father        skin   Hypertension Father    Glaucoma Father    Migraines Sister    Seizures Brother    Depression Brother     Social History   Socioeconomic History   Marital status: Widowed    Spouse name: Not on file   Number of children: Not on file   Years of education: Not on file   Highest education level: Some college, no degree  Occupational History   Occupation: truck Air traffic controller: [employer]  Tobacco Use   Smoking status: Former    Types: Cigars    Quit date: 12/02/2009    Years since quitting: 14.1   Smokeless tobacco: Former    Types: Chew    Quit date: 08/23/2013  Vaping Use   Vaping status: Never Used  Substance and Sexual Activity   Alcohol use: Yes    Alcohol/week: 5.0 standard drinks of alcohol    Types: 5 Shots of liquor per week    Comment: 2-3 shots a day; 1 beer day   Drug use: No   Sexual activity: Not on file  Other Topics Concern   Not on file  Social History Narrative   Widower 09/2022, has step children.   Orig from [facility].   Occupation: delivery driver.   Former Contractor tob user, quit 2010.   Alcohol: 1 beer a day.   Social Drivers of Corporate investment banker Strain: Low Risk  (01/23/2024)   Overall Financial Resource Strain (CARDIA)    Difficulty of Paying Living Expenses: Not hard at all  Food Insecurity: No Food Insecurity (01/23/2024)   Hunger Vital Sign    Worried About Running Out of Food in the Last Year: Never true    Ran Out of Food in the Last Year: Never true   Transportation Needs: No Transportation Needs (01/23/2024)   PRAPARE - Administrator, Civil Service (Medical): No    Lack of Transportation (Non-Medical): No  Physical Activity: Sufficiently Active (01/23/2024)   Exercise Vital Sign    Days of Exercise per Week: 6 days    Minutes of Exercise per Session: 30 min  Stress: Stress Concern Present (01/23/2024)   [employer] of Occupational Health - Occupational Stress Questionnaire    Feeling of Stress : To some extent  Social Connections: Moderately Isolated (01/23/2024)   Social Connection and Isolation Panel [NHANES]    Frequency of Communication with Friends and Family: Three times a  week    Frequency of Social Gatherings with Friends and Family: Twice a week    Attends Religious Services: More than 4 times per year    Active Member of [employer] or Organizations: No    Attends Banker Meetings: Never    Marital Status: Widowed  Intimate Partner Violence: Not At Risk (10/25/2023)   Humiliation, Afraid, Rape, and Kick questionnaire    Fear of Current or Ex-Partner: No    Emotionally Abused: No    Physically Abused: No    Sexually Abused: No    Outpatient Medications Prior to Visit  Medication Sig Dispense Refill   acetaminophen  (TYLENOL ) 650 MG CR tablet Take 1,300 mg by mouth every 8 (eight) hours as needed for pain.     amLODipine -benazepril  (LOTREL) 10-20 MG capsule Take 1 capsule by mouth daily. (Patient taking differently: Take 1 capsule by mouth every evening.) 90 capsule 1   atorvastatin  (LIPITOR) 20 MG tablet TAKE 1/2 TABLET(10 MG) BY MOUTH DAILY (Patient taking differently: TAKE 1/2 TABLET(10 MG) BY MOUTH IN THE EVENING) 45 tablet 1   cetirizine (ZYRTEC) 10 MG tablet Take 10 mg by mouth every evening.     DM-Doxylamine-Acetaminophen  (NYQUIL COLD & FLU PO) Take 1 Dose by mouth daily as needed (congestion/ cold symptoms).     Glucosamine-Chondroitin 750-600 MG TABS Take 2 tablets by mouth daily with  lunch.     HYDROcodone -acetaminophen  (NORCO/VICODIN) 5-325 MG tablet Take 1 tablet by mouth 3 (three) times daily as needed for moderate pain (pain score 4-6). 30 tablet 0   Lidocaine  4 % PTCH Apply 1 patch topically daily as needed (pain).     Magnesium 500 MG TABS Take 1 tablet by mouth at bedtime.     Melatonin 10 MG TABS Take 10 mg by mouth at bedtime.      methocarbamol  (ROBAXIN ) 500 MG tablet Take 1 tablet (500 mg total) by mouth every 6 (six) hours as needed for muscle spasms. 60 tablet 0   Multiple Vitamins-Minerals (MULTIVITAMIN WITH MINERALS) tablet Take 1 tablet by mouth daily.     Omega-3 Fatty Acids (FISH OIL) 1200 MG CAPS Take 1,200-2,400 mg by mouth See admin instructions. Take 2400 mg in the morning and 1200 mg in the evening     OVER THE COUNTER MEDICATION Take 2 tablets by mouth daily. Super Beta Prostate     oxymetazoline (AFRIN) 0.05 % nasal spray Place 1 spray into both nostrils at bedtime as needed for congestion.     Polyethylene Glycol 3350  (MIRALAX  PO) Take 17 g by mouth daily.     Probiotic Product (ALIGN PO) Take 1 capsule by mouth every morning.     Psyllium (METAMUCIL PO) Take 1 Scoop by mouth at bedtime.     tamsulosin  (FLOMAX ) 0.4 MG CAPS capsule Take 0.4 mg by mouth at bedtime. (Patient not taking: Reported on 01/26/2024)     chlorhexidine  (HIBICLENS ) 4 % external liquid Apply 15 mLs (1 Application total) topically as directed for 30 doses. Use as directed daily for 5 days every other week for 6 weeks. 946 mL 1   No facility-administered medications prior to visit.    Allergies  Allergen Reactions   Codeine Itching    Review of Systems  Constitutional:  Negative for appetite change, chills, fatigue and fever.  HENT:  Negative for congestion, dental problem, ear pain and sore throat.   Eyes:  Negative for discharge, redness and visual disturbance.  Respiratory:  Negative for cough, chest tightness, shortness  of breath and wheezing.   Cardiovascular:   Negative for chest pain, palpitations and leg swelling.  Gastrointestinal:  Negative for abdominal pain, blood in stool, diarrhea, nausea and vomiting.  Genitourinary:  Negative for difficulty urinating, dysuria, flank pain, frequency, hematuria and urgency.  Musculoskeletal:  Positive for arthralgias (R knee after walking a lot). Negative for back pain, joint swelling, myalgias and neck stiffness.  Skin:  Negative for pallor and rash.  Neurological:  Negative for dizziness, speech difficulty, weakness and headaches.  Hematological:  Negative for adenopathy. Does not bruise/bleed easily.  Psychiatric/Behavioral:  Negative for confusion and sleep disturbance. The patient is not nervous/anxious.    PE;    01/26/2024    7:55 AM 10/27/2023    5:13 AM 10/26/2023    7:59 PM  Vitals with BMI  Height 5\' 6"     Weight 231 lbs 13 oz    BMI 37.43    Systolic 149 144   144 134  Diastolic 73 85   85 73  Pulse 93 75   77 75    Gen: Alert, well appearing.  Patient is oriented to person, place, time, and situation. AFFECT: pleasant, lucid thought and speech. ENT: Ears: EACs clear, normal epithelium.  TMs with good light reflex and landmarks bilaterally.  Eyes: no injection, icteris, swelling, or exudate.  EOMI, PERRLA. Nose: no drainage or turbinate edema/swelling.  No injection or focal lesion.  Mouth: lips without lesion/swelling.  Oral mucosa pink and moist.  Dentition intact and without obvious caries or gingival swelling.  Oropharynx without erythema, exudate, or swelling.  Neck: supple/nontender.  No LAD, mass, or TM.  Carotid pulses 2+ bilaterally, without bruits. CV: RRR, no m/r/g.   LUNGS: CTA bilat, nonlabored resps, good aeration in all lung fields. ABD: soft, NT, ND, BS normal.  No hepatospenomegaly or mass.  No bruits. EXT: 2+ pitting edema below the knee on the right.  Trace pitting below the knee on the left. Musculoskeletal:   Right knee with well-healed surgical scar.  There is  significant warmth and general swelling.  No tenderness.  Range of motion is intact. Otherwise no joint swelling, erythema, warmth, or tenderness.  ROM of all joints intact. Skin - no sores or suspicious lesions or rashes or color changes  Pertinent labs:  Lab Results  Component Value Date   TSH 1.40 03/15/2018   Lab Results  Component Value Date   WBC 17.6 (H) 10/26/2023   HGB 14.7 10/26/2023   HCT 42.1 10/26/2023   MCV 90.5 10/26/2023   PLT 223 10/26/2023   Lab Results  Component Value Date   CREATININE 0.95 10/26/2023   BUN 13 10/26/2023   NA 134 (L) 10/26/2023   K 4.3 10/26/2023   CL 103 10/26/2023   CO2 22 10/26/2023   Lab Results  Component Value Date   ALT 31 10/20/2023   AST 27 10/20/2023   ALKPHOS 51 10/20/2023   BILITOT 0.7 10/20/2023   Lab Results  Component Value Date   CHOL 153 07/21/2023   Lab Results  Component Value Date   HDL 55.90 07/21/2023   Lab Results  Component Value Date   LDLCALC 77 07/21/2023   Lab Results  Component Value Date   TRIG 102.0 07/21/2023   Lab Results  Component Value Date   CHOLHDL 3 07/21/2023   Lab Results  Component Value Date   PSA 0.86 12/02/2021   PSA 0.65 11/17/2020   PSA 0.51 05/09/2019   Lab Results  Component  Value Date   HGBA1C 5.6 07/21/2023   HGBA1C 5.6 07/21/2023   HGBA1C 5.6 (A) 07/21/2023   HGBA1C 5.6 07/21/2023   ASSESSMENT AND PLAN:    #1 health maintenance exam: Reviewed age and gender appropriate health maintenance issues (prudent diet, regular exercise, health risks of tobacco and excessive alcohol, use of seatbelts, fire alarms in home, use of sunscreen).  Also reviewed age and gender appropriate health screening as well as vaccine recommendations. Vaccines: All UTD. Labs: cbc,cmet, flp, Hba1c. Prostate ca screening: Pt gets PSA monitoring annually through his urologist but d/t recent difficulty following up I'll get this test today and forward result to Dr. Freddi Jaeger. Colon ca  screening: next colonoscopy due 2023-2028 range (Dr. Anibal Kent pt today.  2 hypertension, well-controlled on amlodipine /benazepril  10/20, 1/day. Electrolytes and creatinine today.   2  Hypercholesterolemia, doing well on atorvastatin  20 mg tab, half per day. Lipid panel and hepatic panel today.   3 prediabetes. Hemoglobin A1c ordered today Continue efforts at lifestyle changes..  An After Visit Summary was printed and given to the patient.  FOLLOW UP:  No follow-ups on file.  Signed:  Arletha Lady, MD           01/26/2024

## 2024-01-28 ENCOUNTER — Encounter: Payer: Self-pay | Admitting: Family Medicine

## 2024-03-03 ENCOUNTER — Other Ambulatory Visit: Payer: Self-pay | Admitting: Family Medicine

## 2024-04-09 ENCOUNTER — Ambulatory Visit (INDEPENDENT_AMBULATORY_CARE_PROVIDER_SITE_OTHER): Admitting: Physician Assistant

## 2024-04-09 ENCOUNTER — Encounter: Payer: Self-pay | Admitting: Physician Assistant

## 2024-04-09 ENCOUNTER — Other Ambulatory Visit (INDEPENDENT_AMBULATORY_CARE_PROVIDER_SITE_OTHER)

## 2024-04-09 DIAGNOSIS — Z96651 Presence of right artificial knee joint: Secondary | ICD-10-CM | POA: Diagnosis not present

## 2024-04-09 NOTE — Progress Notes (Signed)
 HPI: Kevin Young returns today for follow-up of his right total knee arthroplasty.  States he was doing well until he had a fall last Tuesday states that he just did not pick his feet up.  He mostly landed on his hip.  Feels that he is overall improving regards to the pain in the right lower extremity.  Review of systems: See HPI otherwise negative  Physical exam: General well-developed well-nourished male who ambulates with assistive device. Right knee full extension full flexion.  No instability valgus varus stressing.  No abnormal warmth erythema or effusion.  Surgical incisions well-healed.  Radiographs: AP lateral views right knee: Status post right total knee arthroplasty with well-seated components.  No acute fractures or acute findings.  Knee is well located.  Impression: Status post right total knee arthroplasty  Plan: He will follow-up with us  at his 1 year postop in January.  Will obtain AP and lateral views of the knee at that time.  To follow-up with us  as needed otherwise.

## 2024-07-27 ENCOUNTER — Encounter: Payer: Self-pay | Admitting: Family Medicine

## 2024-07-27 ENCOUNTER — Ambulatory Visit: Admitting: Family Medicine

## 2024-07-27 VITALS — BP 131/78 | HR 72 | Temp 98.2°F | Ht 66.0 in | Wt 245.4 lb

## 2024-07-27 DIAGNOSIS — M25551 Pain in right hip: Secondary | ICD-10-CM | POA: Diagnosis not present

## 2024-07-27 DIAGNOSIS — R7303 Prediabetes: Secondary | ICD-10-CM | POA: Diagnosis not present

## 2024-07-27 DIAGNOSIS — R0981 Nasal congestion: Secondary | ICD-10-CM

## 2024-07-27 DIAGNOSIS — E7800 Pure hypercholesterolemia, unspecified: Secondary | ICD-10-CM | POA: Diagnosis not present

## 2024-07-27 DIAGNOSIS — I10 Essential (primary) hypertension: Secondary | ICD-10-CM

## 2024-07-27 LAB — COMPREHENSIVE METABOLIC PANEL WITH GFR
ALT: 31 U/L (ref 0–53)
AST: 26 U/L (ref 0–37)
Albumin: 4.5 g/dL (ref 3.5–5.2)
Alkaline Phosphatase: 72 U/L (ref 39–117)
BUN: 14 mg/dL (ref 6–23)
CO2: 29 meq/L (ref 19–32)
Calcium: 9.7 mg/dL (ref 8.4–10.5)
Chloride: 103 meq/L (ref 96–112)
Creatinine, Ser: 1.08 mg/dL (ref 0.40–1.50)
GFR: 71.16 mL/min (ref >60.00)
Glucose, Bld: 118 mg/dL — ABNORMAL HIGH (ref 70–99)
Potassium: 4.8 meq/L (ref 3.5–5.1)
Sodium: 141 meq/L (ref 135–145)
Total Bilirubin: 0.7 mg/dL (ref 0.2–1.2)
Total Protein: 7.3 g/dL (ref 6.0–8.3)

## 2024-07-27 LAB — POCT GLYCOSYLATED HEMOGLOBIN (HGB A1C)
HbA1c POC (<> result, manual entry): 5.8 % (ref 4.0–5.6)
HbA1c, POC (controlled diabetic range): 5.8 % (ref 0.0–7.0)
HbA1c, POC (prediabetic range): 5.8 % (ref 5.7–6.4)
Hemoglobin A1C: 5.8 % — AB (ref 4.0–5.6)

## 2024-07-27 LAB — LIPID PANEL
Cholesterol: 165 mg/dL (ref 0–200)
HDL: 61.5 mg/dL (ref >39.00)
LDL Cholesterol: 81 mg/dL (ref 0–99)
NonHDL: 103.71
Total CHOL/HDL Ratio: 3
Triglycerides: 116 mg/dL (ref 0.0–149.0)
VLDL: 23.2 mg/dL (ref 0.0–40.0)

## 2024-07-27 NOTE — Progress Notes (Signed)
 OFFICE VISIT  07/27/2024  CC:  Chief Complaint  Patient presents with   Medical Management of Chronic Issues    Pt is fasting    Patient is a 69 y.o. male who presents for 7-month follow-up hypertension, hypercholesterolemia, and prediabetes. A/P as of last visit: 1 hypertension, well-controlled on amlodipine /benazepril  10/20, 1/day. Electrolytes and creatinine today.   2  Hypercholesterolemia, doing well on atorvastatin  20 mg tab, half per day. Lipid panel and hepatic panel today.   3 prediabetes. Hemoglobin A1c ordered today Continue efforts at lifestyle changes.  INTERIM HX: Kevin Young is feeling good other than some lateral hip pain, right greater than left.  Hurts really only when he is trying to sleep at night on the side. During the day walking around it does not bother him.  If he sits for a long time it does start hurting some. No groin pain.  When he lays on the right side he can start to feel pain in the right lower leg that goes up the thigh and into the buttock. No paresthesia or leg weakness.  He has put on some weight since last visit, says diet is poor.  However he does walk 2 miles a day for exercises.  He has had a little bit of facial fullness on the right, ear feels full, mild nasal congestion lately. No cough or sore throat or headache or fever.  ROS as above, plus-->  no CP, no SOB, no wheezing, no dizziness,  no rashes, no melena/hematochezia.  No polyuria or polydipsia.  No focal weakness, paresthesias, or tremors.  No acute vision or hearing abnormalities.  No dysuria or unusual/new urinary urgency or frequency.  No recent changes in lower legs. No n/v/d or abd pain.  No palpitations.     Past Medical History:  Diagnosis Date   Allergic rhinitis    Anxiety and depression    Basal cell carcinoma 01/2022   Left arm--Derm referral   BPH (benign prostatic hyperplasia)    Nocturia is his primary symptom:  Takes Flomax    Diverticulitis large intestine  2015/16   Recurrent: GI MD Dr. Rosalie.  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   Eustachian tube dysfunction    with recurrent sinus issues--ENT (Dr. Jesus) recommended surgery for nasal turbinate reduction and septoplasty--as of 11/03/15)   GERD (gastroesophageal reflux disease)    Hepatic steatosis    History of adenomatous polyp of colon    History of kidney stones    History of pneumonia    Hyperlipidemia    Hypertension    Started med 03/2018   IBS (irritable bowel syndrome)    Inguinal pain, left    Chronic; Dr. Sebastian to do MRI pelvis w/out contrast 08/2017 but insurance denied it.   Nephrolithiasis 12/23/2014   4mm nonobstructive R renal calc on CT abd/pelv done for diverticulitis. 04/2021 6 mm nonobstructing stone R kidney, slight R renal pelviectasis, no hydronephrosis.   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  Recall 07/2017 per Dr. Rosalie   COLONOSCOPY  08/24/2017   Diverticulosis from transverse colon to sigmoid.  Otherwise normal.  Repeat in 5-10 years for screening/Dr. Rosalie   ESOPHAGOGASTRODUODENOSCOPY     need specifics from pt   EXTRACORPOREAL SHOCK WAVE LITHOTRIPSY Right 12/10/2022   EXTRACORPOREAL SHOCK WAVE LITHOTRIPSY Right 12/10/2022   Procedure: RIGHT EXTRACORPOREAL SHOCK WAVE LITHOTRIPSY (ESWL);  Surgeon: Selma Donnice SAUNDERS, MD;  Location: [facility];  Service: Urology;  Laterality: Right;  75 MINUTES NEEDED FOR CASE   HERNIA REPAIR Right 2024   multiple.  Right inguinal repair by Dr. Dann Hummer in 2004.  07/2017 having some muscular/nerve pain in groin--evaluated by Dr. Cheryal w/u at that time.   INCISIONAL HERNIA REPAIR N/A 04/22/2020   Procedure: HERNIA REPAIR INCISIONAL WITH MESH;  Surgeon: Hummer Dann, MD;  Location: [facility];  Service: General;  Laterality: N/A;   JOINT REPLACEMENT  10/25/2023   R Knee total repl.   KNEE ARTHROSCOPY Right 2024   meniscectomy   LAPAROSCOPIC SIGMOID COLECTOMY N/A 09/15/2015   Procedure: LAPAROSCOPIC ASSISTED  SIGMOID COLECTOMY;  Surgeon: Dann Hummer, MD;  Location: [facility] OR;  Service: General;  Laterality: N/A;   SUPRA-UMBILICAL HERNIA N/A 07/14/2021   Procedure: SUPRA-UMBILICAL HERNIA REPAIR WITH MESH;  Surgeon: Hummer Dann, MD;  Location: [facility] OR;  Service: General;  Laterality: N/A;   TONSILLECTOMY     TOTAL KNEE ARTHROPLASTY Right 10/25/2023   Procedure: RIGHT TOTAL KNEE ARTHROPLASTY;  Surgeon: Vernetta Lonni GRADE, MD;  Location: [facility] OR;  Service: Orthopedics;  Laterality: Right;    Outpatient Medications Prior to Visit  Medication Sig Dispense Refill   acetaminophen  (TYLENOL ) 650 MG CR tablet Take 1,300 mg by mouth every 8 (eight) hours as needed for pain.     amLODipine -benazepril  (LOTREL) 10-20 MG capsule Take 1 capsule by mouth daily. 90 capsule 3   atorvastatin  (LIPITOR) 20 MG tablet TAKE 1/2 TABLET(10 MG) BY MOUTH DAILY 45 tablet 3   cetirizine (ZYRTEC) 10 MG tablet Take 10 mg by mouth every evening.     DM-Doxylamine-Acetaminophen  (NYQUIL COLD & FLU PO) Take 1 Dose by mouth daily as needed (congestion/ cold symptoms).     Lidocaine  4 % PTCH Apply 1 patch topically daily as needed (pain).     Magnesium 500 MG TABS Take 1 tablet by mouth at bedtime.     Melatonin 10 MG TABS Take 10 mg by mouth at bedtime.      methocarbamol  (ROBAXIN ) 500 MG tablet Take 1 tablet (500 mg total) by mouth every 6 (six) hours as needed for muscle spasms. 60 tablet 0   Multiple Vitamins-Minerals (MULTIVITAMIN WITH MINERALS) tablet Take  1 tablet by mouth daily.     Omega-3 Fatty Acids (FISH OIL) 1200 MG CAPS Take 1,200-2,400 mg by mouth See admin instructions. Take 2400 mg in the morning and 1200 mg in the evening     OVER THE COUNTER MEDICATION Take 2 tablets by mouth daily. Super Beta Prostate     oxymetazoline (AFRIN) 0.05 % nasal spray Place 1 spray into both nostrils at bedtime as needed for congestion.     Polyethylene Glycol 3350  (MIRALAX  PO) Take 17 g by mouth daily.     Probiotic Product (ALIGN PO) Take 1 capsule by mouth every morning.     Psyllium (METAMUCIL PO) Take 1 Scoop by mouth at bedtime.     tamsulosin  (FLOMAX ) 0.4 MG CAPS capsule  Take 0.4 mg by mouth at bedtime.     Glucosamine-Chondroitin 750-600 MG TABS Take 2 tablets by mouth daily with lunch.     No facility-administered medications prior to visit.    Allergies  Allergen Reactions   Codeine Itching    Review of Systems As per HPI  PE:    07/27/2024    8:17 AM 01/26/2024    7:55 AM 10/27/2023    5:13 AM  Vitals with BMI  Height 5' 6 5' 6   Weight 245 lbs 6 oz 231 lbs 13 oz   BMI 39.63 37.43   Systolic 131 149 855   144  Diastolic 78 73 85   85  Pulse 72 93 75   77     Physical Exam  Gen: Alert, well appearing.  Patient is oriented to person, place, time, and situation. AFFECT: pleasant, lucid thought and speech. Kevin Young:Zbzd: no injection, icteris, swelling, or exudate.  EOMI, PERRLA. Mouth: lips without lesion/swelling.  Oral mucosa pink and moist.  Ears: External auditory canals clear, tympanic membranes normal.  Nose: No edematous or injected mucosa.  No purulent mucus.  He has mild deviation of the nasal septum to the right.  Oropharynx without erythema, exudate, or swelling.  CV: RRR, no m/r/g.   LUNGS: CTA bilat, nonlabored resps, good aeration in all lung fields. No tenderness to palpation over the lumbar or sacral spine.  He has focal tenderness to palpation over the greater trochanter on the right, just a little  sensitivity on the left greater trochanteric region. Supine straight leg raise is negative bilaterally. FABER shows a bit of limitation in range of motion due to tightness.  External rotation does elicit some of the pain in the lateral hip. Left hip exam normal.  LABS:  Last CBC Lab Results  Component Value Date   WBC 7.0 01/26/2024   HGB 14.6 01/26/2024   HCT 43.8 01/26/2024   MCV 91.4 01/26/2024   MCH 31.6 10/26/2023   RDW 14.2 01/26/2024   PLT 216.0 01/26/2024   Last metabolic panel Lab Results  Component Value Date   GLUCOSE 120 (H) 01/26/2024   NA 139 01/26/2024   K 4.6 01/26/2024   CL 104 01/26/2024   CO2 30 01/26/2024   BUN 13 01/26/2024   CREATININE 0.93 01/26/2024   GFR 85.44 01/26/2024   CALCIUM  9.5 01/26/2024   PROT 6.5 01/26/2024   ALBUMIN  4.2 01/26/2024   BILITOT 0.6 01/26/2024   ALKPHOS 58 01/26/2024   AST 27 01/26/2024   ALT 28 01/26/2024   ANIONGAP 9 10/26/2023   Last lipids Lab Results  Component Value Date   CHOL 150 01/26/2024   HDL 55.50 01/26/2024   LDLCALC 77 01/26/2024   LDLDIRECT 171.0 11/18/2006   TRIG 88.0 01/26/2024   CHOLHDL 3 01/26/2024   Last hemoglobin A1c Lab Results  Component Value Date   HGBA1C 5.8 (A) 07/27/2024   HGBA1C 5.8 07/27/2024   HGBA1C 5.8 07/27/2024   HGBA1C 5.8 07/27/2024   Last thyroid  functions Lab Results  Component Value Date   TSH 1.40 03/15/2018   IMPRESSION AND PLAN:  1 hypertension, well-controlled on amlodipine /benazepril  10/20, 1/day. Electrolytes and creatinine today.   2  Hypercholesterolemia, doing well on atorvastatin  20 mg tab, half per day. Lipid panel and hepatic panel today.   3 prediabetes. Hemoglobin A1c is 5.8% today. Continue efforts at lifestyle changes  #4 nasal and sinus congestion.  His deviated septum likely is contributing to this. No sign of acute  sinusitis.  Reassured.  5.  Right hip pain.  Some of his symptoms are described like sciatica but most are consistent  with trochanteric pain syndrome. At this point it only affects him at night when he tries to lay on the right side.  Stretches discussed. Consider steroid injection or PT if worsening in the future.  An After Visit Summary was printed and given to the patient.  FOLLOW UP: Return in about 6 months (around 01/25/2025) for annual CPE (fasting).  Signed:  Gerlene Hockey, MD           07/27/2024

## 2024-07-30 ENCOUNTER — Ambulatory Visit: Payer: Self-pay | Admitting: Family Medicine

## 2024-08-06 ENCOUNTER — Encounter: Payer: Self-pay | Admitting: Radiology

## 2024-10-10 ENCOUNTER — Ambulatory Visit: Admitting: Physician Assistant

## 2024-10-15 ENCOUNTER — Encounter: Payer: Self-pay | Admitting: Physician Assistant

## 2024-10-15 ENCOUNTER — Other Ambulatory Visit: Payer: Self-pay

## 2024-10-15 ENCOUNTER — Ambulatory Visit: Admitting: Physician Assistant

## 2024-10-15 DIAGNOSIS — M7061 Trochanteric bursitis, right hip: Secondary | ICD-10-CM | POA: Diagnosis not present

## 2024-10-15 DIAGNOSIS — Z96651 Presence of right artificial knee joint: Secondary | ICD-10-CM | POA: Diagnosis not present

## 2024-10-15 MED ORDER — LIDOCAINE HCL 1 % IJ SOLN
3.0000 mL | INTRAMUSCULAR | Status: AC | PRN
  Administered 2024-10-15: 3 mL

## 2024-10-15 MED ORDER — METHYLPREDNISOLONE ACETATE 40 MG/ML IJ SUSP
40.0000 mg | INTRAMUSCULAR | Status: AC | PRN
  Administered 2024-10-15: 40 mg via INTRA_ARTICULAR

## 2024-10-15 NOTE — Progress Notes (Signed)
 "  Office Visit Note   Patient: Kevin Young           Date of Birth: 08-22-1956           MRN: [id number] Visit Date: 10/15/2024              Requested by: Candise Aleene DEL, MD [address] [place],  [place] [address] PCP: Candise Aleene DEL, MD   Assessment & Plan: Visit Diagnoses:  1. Hx of total knee arthroplasty, right   2. Trochanteric bursitis, right hip     Plan:  Offered trochanteric injection today he is agreeable.  Tolerated well.  He was shown IT band stretching exercise.  In regards to the knee he will follow-up with us  as needed.  He will continue work on strengthening of the knee.  If the hip pain persist despite the cortisone injection we will follow-up.  Follow-Up Instructions: Return if symptoms worsen or fail to improve.   Orders:  Orders Placed This Encounter  Procedures   Large Joint Inj   XR Knee 1-2 Views Right   No orders of the defined types were placed in this encounter.     Procedures: Large Joint Inj: R greater trochanter on 10/15/2024 10:30 AM Indications: pain Details: 22 G 1.5 in needle, lateral approach  Arthrogram: No  Medications: 3 mL lidocaine  1 %; 40 mg methylPREDNISolone  acetate 40 MG/ML Outcome: tolerated well, no immediate complications Procedure, treatment alternatives, risks and benefits explained, specific risks discussed. Consent was given by the patient. Immediately prior to procedure a time out was called to verify the correct patient, procedure, equipment, support staff and site/side marked as required. Patient was prepped and draped in the usual sterile fashion.       Clinical Data: No additional findings.   Subjective: Chief Complaint  Patient presents with   Right Knee - Follow-up    10/25/23 RT TKA    HPI Kevin Young 69 year old male comes in today with right hip pain.  Radiates from his buttocks to the right calf at times.  Worse whenever he is lying on the right side.  He walks about 2-1/2  miles a day.  He status post right total knee arthroplasty 10/25/2023 is here for his annual follow-up.  Said no injury to the hip or the knee.  Denies any back pain.  Denies any numbness tingling down the leg.  No groin pain.  Review of Systems  Constitutional:  Negative for chills and fever.  Musculoskeletal:  Negative for back pain.  Neurological:  Negative for numbness.     Objective: Vital Signs: There were no vitals taken for this visit.  Physical Exam Constitutional:      Appearance: He is not ill-appearing or diaphoretic.  Pulmonary:     Effort: Pulmonary effort is normal.  Neurological:     Mental Status: He is alert and oriented to person, place, and time.  Psychiatric:        Mood and Affect: Mood normal.     Ortho Exam Right knee: Surgical incisions well-healed.  No instability with valgus varus stressing.  No abnormal warmth erythema or effusion.  Nontender along medial lateral joint line.  Full range of motion of the knee. Bilateral hips: Good range of motion of both hips.  Right hip discomfort with external rotation.  Tenderness of the right hip trochanteric region no tenderness of the left hip trochanteric region.   Specialty Comments:  No specialty comments available.  Imaging: No  results found.   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 Rosalie.  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   Eustachian tube dysfunction    with recurrent sinus issues--ENT (Dr. Jesus) recommended surgery for nasal turbinate reduction and septoplasty--as of 11/03/15)   GERD (gastroesophageal reflux disease)    Hepatic steatosis    History of adenomatous polyp of colon    History of kidney stones    History of pneumonia    Hyperlipidemia    Hypertension    Started med 03/2018   IBS (irritable bowel syndrome)    Inguinal pain, left    Chronic; Dr. Sebastian to do MRI pelvis w/out contrast 08/2017 but insurance denied it.   Nephrolithiasis 12/23/2014   4mm nonobstructive R renal calc on CT abd/pelv done for diverticulitis. 04/2021 6 mm nonobstructing stone R kidney, slight R renal pelviectasis, no hydronephrosis.   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  Recall 07/2017 per Dr. Rosalie   COLONOSCOPY  08/24/2017   Diverticulosis from transverse  colon to sigmoid.  Otherwise normal.  Repeat in 5-10 years for screening/Dr. Rosalie   ESOPHAGOGASTRODUODENOSCOPY     need specifics from pt   EXTRACORPOREAL SHOCK WAVE LITHOTRIPSY Right 12/10/2022   EXTRACORPOREAL SHOCK WAVE LITHOTRIPSY Right 12/10/2022   Procedure: RIGHT EXTRACORPOREAL SHOCK WAVE LITHOTRIPSY (ESWL);  Surgeon: Selma Donnice SAUNDERS, MD;  Location: [facility];  Service: Urology;  Laterality: Right;  75 MINUTES NEEDED FOR CASE   HERNIA REPAIR Right 2024   multiple.  Right inguinal repair by Dr. Dann Sebastian in 2004.  07/2017 having some muscular/nerve pain in groin--evaluated by Dr. Cheryal w/u at that time.   INCISIONAL HERNIA REPAIR N/A 04/22/2020   Procedure: HERNIA REPAIR INCISIONAL WITH MESH;  Surgeon: Sebastian Moles, MD;  Location: [facility] OR;  Service: General;  Laterality: N/A;   JOINT REPLACEMENT  10/25/2023   R Knee total repl.   KNEE ARTHROSCOPY Right 2024   meniscectomy   LAPAROSCOPIC SIGMOID COLECTOMY N/A 09/15/2015   Procedure: LAPAROSCOPIC ASSISTED  SIGMOID COLECTOMY;  Surgeon: Moles Sebastian, MD;  Location: [facility] OR;  Service: General;  Laterality: N/A;   SUPRA-UMBILICAL HERNIA N/A 07/14/2021   Procedure: SUPRA-UMBILICAL HERNIA REPAIR WITH MESH;  Surgeon: Sebastian Moles, MD;  Location: [facility] OR;  Service: General;  Laterality: N/A;   TONSILLECTOMY     TOTAL KNEE ARTHROPLASTY Right 10/25/2023   Procedure: RIGHT TOTAL KNEE ARTHROPLASTY;  Surgeon: Vernetta Lonni GRADE, MD;  Location: [facility] OR;  Service: Orthopedics;  Laterality: Right;   Social History   Occupational History   Occupation: truck Air Traffic Controller: [employer]  Tobacco Use   Smoking status: Former    Types: Cigars    Quit date: 12/02/2009    Years since quitting: 14.8   Smokeless tobacco: Former    Types: Chew    Quit date: 08/23/2013  Vaping Use   Vaping status: Never Used  Substance and Sexual Activity   Alcohol use: Yes    Alcohol/week: 5.0 standard drinks of alcohol     Types: 5 Shots of liquor per week    Comment: 2-3 shots a day; 1 beer day   Drug use: No   Sexual activity: Not on file        "

## 2025-01-25 ENCOUNTER — Encounter: Admitting: Family Medicine
